# Patient Record
Sex: Female | Born: 1935
Health system: Southern US, Community
[De-identification: ages and names within clinical notes are randomized; demographics above are authoritative.]

## PROBLEM LIST (undated history)

## (undated) DIAGNOSIS — E785 Hyperlipidemia, unspecified: Secondary | ICD-10-CM

## (undated) DIAGNOSIS — I10 Essential (primary) hypertension: Secondary | ICD-10-CM

## (undated) DIAGNOSIS — M1990 Unspecified osteoarthritis, unspecified site: Secondary | ICD-10-CM

## (undated) DIAGNOSIS — K219 Gastro-esophageal reflux disease without esophagitis: Secondary | ICD-10-CM

## (undated) DIAGNOSIS — I2089 Other forms of angina pectoris: Secondary | ICD-10-CM

## (undated) DIAGNOSIS — R1310 Dysphagia, unspecified: Secondary | ICD-10-CM

## (undated) DIAGNOSIS — I208 Other forms of angina pectoris: Secondary | ICD-10-CM

## (undated) DIAGNOSIS — K635 Polyp of colon: Secondary | ICD-10-CM

## (undated) DIAGNOSIS — D509 Iron deficiency anemia, unspecified: Secondary | ICD-10-CM

## (undated) DIAGNOSIS — E119 Type 2 diabetes mellitus without complications: Secondary | ICD-10-CM

## (undated) HISTORY — DX: Hyperlipidemia, unspecified: E78.5

## (undated) HISTORY — PX: HERNIA REPAIR: SHX51

## (undated) HISTORY — DX: Iron deficiency anemia, unspecified: D50.9

## (undated) HISTORY — PX: ABDOMINAL HYSTERECTOMY: SHX81

---
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
## 1968-11-18 HISTORY — PX: ABDOMINAL HYSTERECTOMY: SHX81

## 1968-11-18 HISTORY — PX: APPENDECTOMY: SHX54

## 2005-09-19 ENCOUNTER — Emergency Department: Payer: Self-pay | Admitting: Emergency Medicine

## 2005-09-19 ENCOUNTER — Other Ambulatory Visit: Payer: Self-pay

## 2007-03-16 ENCOUNTER — Ambulatory Visit: Payer: Self-pay | Admitting: Unknown Physician Specialty

## 2007-03-16 ENCOUNTER — Other Ambulatory Visit: Payer: Self-pay

## 2007-05-25 ENCOUNTER — Ambulatory Visit: Payer: Self-pay | Admitting: Unknown Physician Specialty

## 2009-10-12 ENCOUNTER — Emergency Department: Payer: Self-pay | Admitting: Emergency Medicine

## 2009-10-23 ENCOUNTER — Emergency Department: Payer: Self-pay | Admitting: Unknown Physician Specialty

## 2012-01-07 ENCOUNTER — Ambulatory Visit: Payer: Self-pay

## 2012-02-06 ENCOUNTER — Ambulatory Visit: Payer: Self-pay | Admitting: Unknown Physician Specialty

## 2012-08-28 ENCOUNTER — Emergency Department: Payer: Self-pay | Admitting: Emergency Medicine

## 2014-11-23 DIAGNOSIS — Z961 Presence of intraocular lens: Secondary | ICD-10-CM | POA: Diagnosis not present

## 2014-11-23 DIAGNOSIS — E11311 Type 2 diabetes mellitus with unspecified diabetic retinopathy with macular edema: Secondary | ICD-10-CM | POA: Diagnosis not present

## 2014-11-24 DIAGNOSIS — R05 Cough: Secondary | ICD-10-CM | POA: Diagnosis not present

## 2014-11-24 DIAGNOSIS — E119 Type 2 diabetes mellitus without complications: Secondary | ICD-10-CM | POA: Diagnosis not present

## 2014-11-24 DIAGNOSIS — T148 Other injury of unspecified body region: Secondary | ICD-10-CM | POA: Diagnosis not present

## 2014-11-24 DIAGNOSIS — L03115 Cellulitis of right lower limb: Secondary | ICD-10-CM | POA: Diagnosis not present

## 2014-11-24 DIAGNOSIS — I10 Essential (primary) hypertension: Secondary | ICD-10-CM | POA: Diagnosis not present

## 2014-12-08 DIAGNOSIS — E785 Hyperlipidemia, unspecified: Secondary | ICD-10-CM | POA: Diagnosis not present

## 2014-12-08 DIAGNOSIS — R05 Cough: Secondary | ICD-10-CM | POA: Diagnosis not present

## 2014-12-08 DIAGNOSIS — Z1389 Encounter for screening for other disorder: Secondary | ICD-10-CM | POA: Diagnosis not present

## 2014-12-08 DIAGNOSIS — Z9181 History of falling: Secondary | ICD-10-CM | POA: Diagnosis not present

## 2014-12-08 DIAGNOSIS — I10 Essential (primary) hypertension: Secondary | ICD-10-CM | POA: Diagnosis not present

## 2014-12-21 DIAGNOSIS — J387 Other diseases of larynx: Secondary | ICD-10-CM | POA: Diagnosis not present

## 2014-12-21 DIAGNOSIS — R490 Dysphonia: Secondary | ICD-10-CM | POA: Diagnosis not present

## 2014-12-21 DIAGNOSIS — E041 Nontoxic single thyroid nodule: Secondary | ICD-10-CM | POA: Diagnosis not present

## 2014-12-26 ENCOUNTER — Ambulatory Visit: Payer: Self-pay | Admitting: Otolaryngology

## 2014-12-26 DIAGNOSIS — E041 Nontoxic single thyroid nodule: Secondary | ICD-10-CM | POA: Diagnosis not present

## 2015-01-10 ENCOUNTER — Ambulatory Visit: Payer: Self-pay | Admitting: Otolaryngology

## 2015-01-10 DIAGNOSIS — J387 Other diseases of larynx: Secondary | ICD-10-CM | POA: Diagnosis not present

## 2015-01-10 DIAGNOSIS — R490 Dysphonia: Secondary | ICD-10-CM | POA: Diagnosis not present

## 2015-01-10 DIAGNOSIS — E041 Nontoxic single thyroid nodule: Secondary | ICD-10-CM | POA: Diagnosis not present

## 2015-02-01 DIAGNOSIS — E041 Nontoxic single thyroid nodule: Secondary | ICD-10-CM | POA: Diagnosis not present

## 2015-02-01 DIAGNOSIS — J387 Other diseases of larynx: Secondary | ICD-10-CM | POA: Diagnosis not present

## 2015-02-01 DIAGNOSIS — J301 Allergic rhinitis due to pollen: Secondary | ICD-10-CM | POA: Diagnosis not present

## 2015-02-01 DIAGNOSIS — R1310 Dysphagia, unspecified: Secondary | ICD-10-CM | POA: Diagnosis not present

## 2015-03-15 ENCOUNTER — Ambulatory Visit
Admit: 2015-03-15 | Disposition: A | Discharge status: Home / Self Care | Payer: Self-pay | Attending: Family Medicine | Admitting: Family Medicine

## 2015-03-15 DIAGNOSIS — M1990 Unspecified osteoarthritis, unspecified site: Secondary | ICD-10-CM | POA: Diagnosis not present

## 2015-03-15 DIAGNOSIS — R0789 Other chest pain: Secondary | ICD-10-CM | POA: Diagnosis not present

## 2015-03-15 DIAGNOSIS — E119 Type 2 diabetes mellitus without complications: Secondary | ICD-10-CM | POA: Diagnosis not present

## 2015-03-15 DIAGNOSIS — K219 Gastro-esophageal reflux disease without esophagitis: Secondary | ICD-10-CM | POA: Diagnosis not present

## 2015-03-15 DIAGNOSIS — I10 Essential (primary) hypertension: Secondary | ICD-10-CM | POA: Diagnosis not present

## 2015-03-16 ENCOUNTER — Ambulatory Visit: Admit: 2015-03-16 | Disposition: A | Discharge status: Home / Self Care | Payer: Self-pay | Admitting: Family Medicine

## 2015-03-16 DIAGNOSIS — R05 Cough: Secondary | ICD-10-CM | POA: Diagnosis not present

## 2015-03-16 DIAGNOSIS — R0789 Other chest pain: Secondary | ICD-10-CM | POA: Diagnosis not present

## 2015-04-22 ENCOUNTER — Encounter: Payer: Self-pay | Admitting: Emergency Medicine

## 2015-04-22 ENCOUNTER — Emergency Department
Admission: EM | Admit: 2015-04-22 | Discharge: 2015-04-22 | Disposition: A | Discharge status: Home / Self Care | Payer: Medicare Other | Attending: Emergency Medicine | Admitting: Emergency Medicine

## 2015-04-22 DIAGNOSIS — L237 Allergic contact dermatitis due to plants, except food: Secondary | ICD-10-CM | POA: Diagnosis not present

## 2015-04-22 DIAGNOSIS — E119 Type 2 diabetes mellitus without complications: Secondary | ICD-10-CM | POA: Diagnosis not present

## 2015-04-22 DIAGNOSIS — I10 Essential (primary) hypertension: Secondary | ICD-10-CM | POA: Insufficient documentation

## 2015-04-22 HISTORY — DX: Type 2 diabetes mellitus without complications: E11.9

## 2015-04-22 HISTORY — DX: Essential (primary) hypertension: I10

## 2015-04-22 MED ORDER — PREDNISONE 10 MG PO TABS: 50.0000 mg | ORAL_TABLET | Freq: Every day | ORAL | Status: AC

## 2015-04-22 MED ORDER — DEXAMETHASONE SODIUM PHOSPHATE 4 MG/ML IJ SOLN: 8.0000 mg | Freq: Once | INTRAMUSCULAR | Status: AC

## 2015-04-22 MED ORDER — DEXAMETHASONE SODIUM PHOSPHATE 10 MG/ML IJ SOLN
INTRAMUSCULAR | Status: AC
  Administered 2015-04-22: 8 mg via INTRAMUSCULAR
  Filled 2015-04-22: qty 1

## 2015-04-22 NOTE — ED Notes (Signed)
Patient states she was in her yard about a week ago cleaning under her grapevine.  2-3 days ago started getting a rash around right eye.  Swelling has increased since yesterday and patient states she hasn't been feeling well either.

## 2015-04-22 NOTE — ED Provider Notes (Signed)
[facility] Emergency Department Provider Note  ____________________________________________  Time seen: Approximately 11:16 AM  I have reviewed the triage vital signs and the nursing notes.   HISTORY  Chief Complaint Poison Ivy    HPI Sherri Leon is a 79 y.o. female Lib[place] Maw for evaluation of poison oak/ivy around her right eye 2 days. She states she was cleaning out in the yard rub dry and her eyes progressively gotten more and more swollen.   Past Medical History  Diagnosis Date  . Diabetes mellitus without complication   . Hypertension     There are no active problems to display for this patient.   Past Surgical History  Procedure Laterality Date  . Abdominal hysterectomy    . Hernia repair      Current Outpatient Rx  Name  Route  Sig  Dispense  Refill  . predniSONE (DELTASONE) 10 MG tablet   Oral   Take 5 tablets (50 mg total) by mouth daily with breakfast.   25 tablet   0     Allergies Review of patient's allergies indicates no known allergies.  Family History  Problem Relation Age of Onset  . Family history unknown: Yes    Social History History  Substance Use Topics  . Smoking status: Never Smoker   . Smokeless tobacco: Never Used  . Alcohol Use: No    Review of Systems Constitutional: No fever/chills Eyes: No visual changes. Rash and swelling around the right eye. ENT: No sore throat. Cardiovascular: Denies chest pain. Respiratory: Denies shortness of breath. Gastrointestinal: No abdominal pain.  No nausea, no vomiting.  No diarrhea.  No constipation. Genitourinary: Negative for dysuria. Musculoskeletal: Negative for back pain. Skin: Negative for rash. Neurological: Negative for headaches, focal weakness or numbness.  10-point ROS otherwise negative.  ____________________________________________   PHYSICAL EXAM:  VITAL SIGNS: ED Triage Vitals  Enc Vitals Group     BP 04/22/15 1112 109/58 mmHg   Pulse Rate 04/22/15 1112 56     Resp 04/22/15 1112 18     Temp 04/22/15 1112 98.2 F (36.8 C)     Temp Source 04/22/15 1112 Oral     SpO2 04/22/15 1112 97 %     Weight 04/22/15 1112 175 lb (79.379 kg)     Height 04/22/15 1112 5\' 7"  (1.702 m)     Head Cir --      Peak Flow --      Pain Score 04/22/15 1107 0     Pain Loc --      Pain Edu? --      Excl. in [place]? --     Constitutional: Alert and oriented. Well appearing and in no acute distress. Eyes: Conjunctivae are normal. PERRL. EOMI. positive periorbital edema with vesicular type lesions Head: Atraumatic. Nose: No congestion/rhinnorhea. Mouth/Throat: Mucous membranes are moist.  Oropharynx non-erythematous. Neck: No stridor.   Cardiovascular: Normal rate, regular rhythm. Grossly normal heart sounds.  Good peripheral circulation. Respiratory: Normal respiratory effort.  No retractions. Lungs CTAB. Neurologic:  Normal speech and language. No gross focal neurologic deficits are appreciated. Speech is normal. No gait instability. Skin:  Skin is warm, dry and intact. No rash noted. Psychiatric: Mood and affect are normal. Speech and behavior are normal.  ____________________________________________   LABS (all labs ordered are listed, but only abnormal results are displayed)  Labs Reviewed - No data to display ____________________________________________  EKG  Not applicable ____________________________________________  RADIOLOGY  Deferred ____________________________________________   PROCEDURES  Procedure(s) performed:  None  Critical Care performed: No  ____________________________________________   INITIAL IMPRESSION / ASSESSMENT AND PLAN / ED COURSE  Pertinent labs & imaging results that were available during my care of the patient were reviewed by me and considered in my medical decision making (see chart for details).  Nose with acute poison oak/poison ivy. Treatment includes Decadron IM today follow-up with  prescription of prednisone. Patient to return to clinic as needed. She voices no other emergency medical complaints at this time. ____________________________________________   FINAL CLINICAL IMPRESSION(S) / ED DIAGNOSES  Final diagnoses:  Poison oak dermatitis      Arlyss Repress, PA-C 04/22/15 1130  Lisa Roca, MD 04/22/15 1540

## 2015-04-22 NOTE — Discharge Instructions (Signed)
Contact Dermatitis Contact dermatitis is a rash that happens when something touches the skin. You touched something that irritates your skin, or you have allergies to something you touched. HOME CARE   Avoid the thing that caused your rash.  Keep your rash away from hot water, soap, sunlight, chemicals, and other things that might bother it.  Do not scratch your rash.  You can take cool baths to help stop itching.  Only take medicine as told by your doctor.  Keep all doctor visits as told. GET HELP RIGHT AWAY IF:   Your rash is not better after 3 days.  Your rash gets worse.  Your rash is puffy (swollen), tender, red, sore, or warm.  You have problems with your medicine. MAKE SURE YOU:   Understand these instructions.  Will watch your condition.  Will get help right away if you are not doing well or get worse. Document Released: 09/01/2009 Document Revised: 01/27/2012 Document Reviewed: 04/09/2011 [facility] Patient Information 2015 [place], [place]. This information is not intended to replace advice given to you by your health care provider. Make sure you discuss any questions you have with your health care provider.  Poison [facility] is an inflammation of the skin (contact dermatitis). It is caused by contact with the allergens on the leaves of the oak (toxicodendron) plants. Depending on your sensitivity, the rash may consist simply of redness and itching, or it may also progress to blisters which may break open (rupture). These must be well cared for to prevent secondary germ (bacterial) infection as these infections can lead to scarring. The eyes may also get puffy. The puffiness is worst in the morning and gets better as the day progresses. Healing is best accomplished by keeping any open areas dry, clean, covered with a bandage, and covered with an antibacterial ointment if needed. Without secondary infection, this dermatitis usually heals without scarring within 2 to 3  weeks without treatment. HOME CARE INSTRUCTIONS When you have been exposed to poison oak, it is very important to thoroughly wash with soap and water as soon as the exposure has been discovered. You have about one half hour to remove the plant resin before it will cause the rash. This cleaning will quickly destroy the oil or antigen on the skin (the antigen is what causes the rash). Wash aggressively under the fingernails as any plant resin still there will continue to spread the rash. Do not rub skin vigorously when washing affected area. Poison oak cannot spread if no oil from the plant remains on your body. Rash that has progressed to weeping sores (lesions) will not spread the rash unless you have not washed thoroughly. It is also important to clean any clothes you have been wearing as they may carry active allergens which will spread the rash, even several days later. Avoidance of the plant in the future is the best measure. Poison oak plants can be recognized by the number of leaves. Generally, poison oak has three leaves with flowering branches on a single stem. Diphenhydramine may be purchased over the counter and used as needed for itching. Do not drive with this medication if it makes you drowsy. Ask your caregiver about medication for children. SEEK IMMEDIATE MEDICAL CARE IF:   Open areas of the rash develop.  You notice redness extending beyond the area of the rash.  There is a pus like discharge.  There is increased pain.  Other signs of infection develop (such as fever). Document Released: 05/11/2003 Document  Revised: 01/27/2012 Document Reviewed: 09/20/2009 [employer] Patient Information 2015 [place], [place]. This information is not intended to replace advice given to you by your health care provider. Make sure you discuss any questions you have with your health care provider.

## 2015-05-20 IMAGING — US ULTRASOUND CORE BIOPSY
1 series · 9 of 9 positions shown · non-contrast
Comparison: none

CLINICAL DATA: Solitary  left thyroid nodule.

EXAM:
ULTRASOUND-GUIDED THYROID ASPIRATION BIOPSY
TECHNIQUE: The procedure, risks (including but not limited to bleeding,
infection, organ damage ), benefits, and alternatives were explained
to the patient. Questions regarding the procedure were encouraged
and answered. The patient understands and consents to the procedure.

[Series 1: ultrasound core biopsy · 0.06mm/px · 9 acquisitions, 9 frames shown]
[im 1/9]
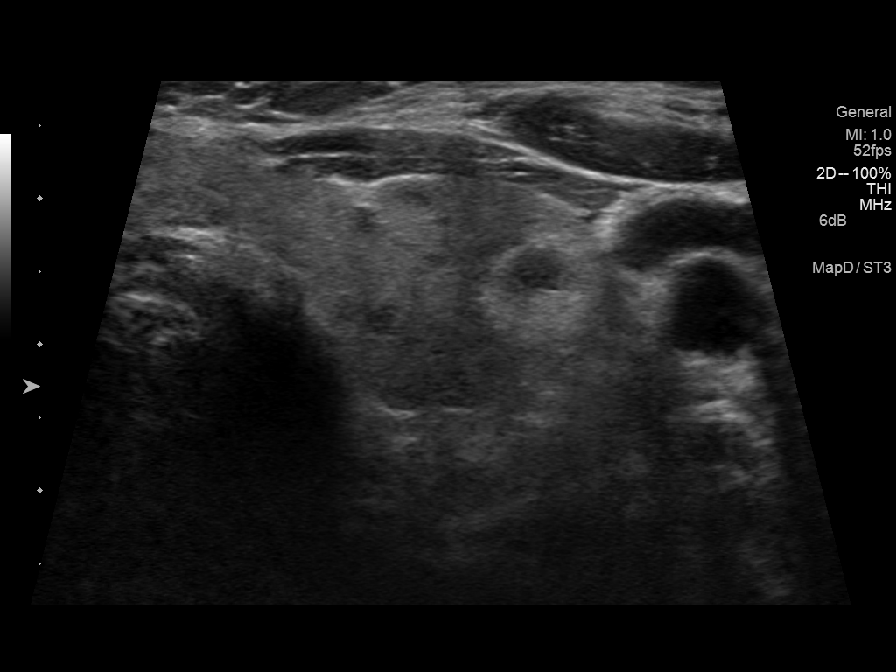
[im 2/9]
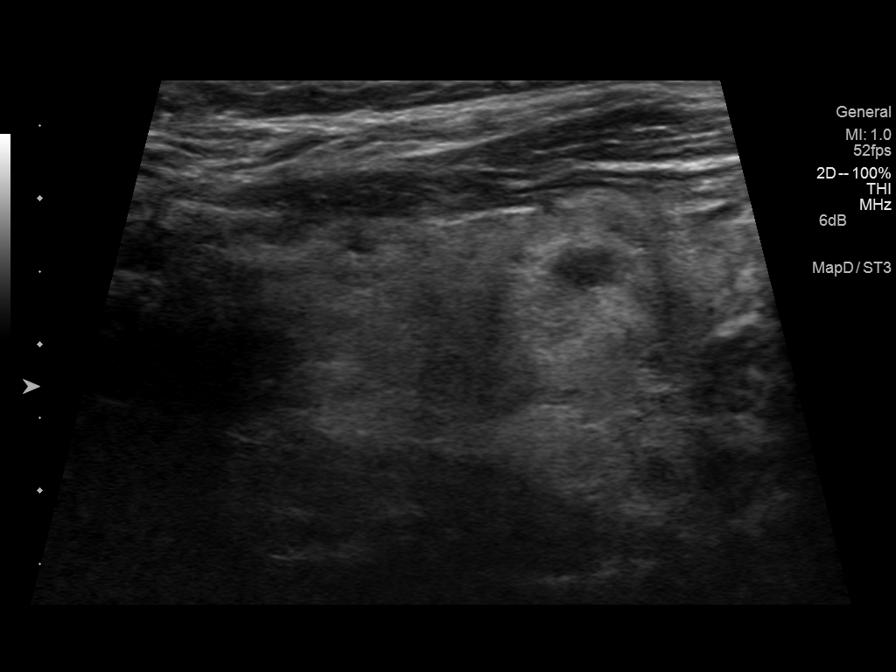
[im 3/9]
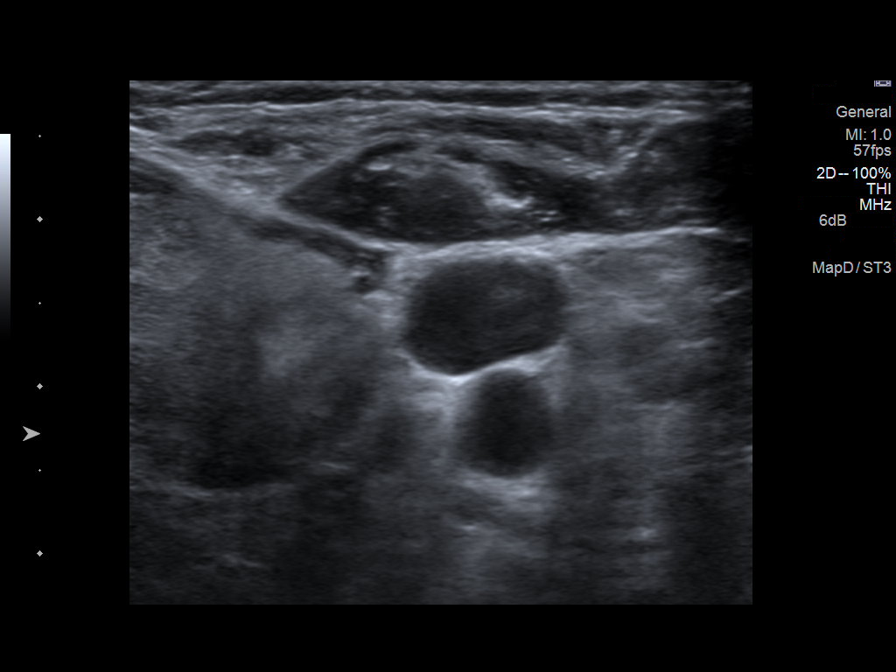
[im 4/9]
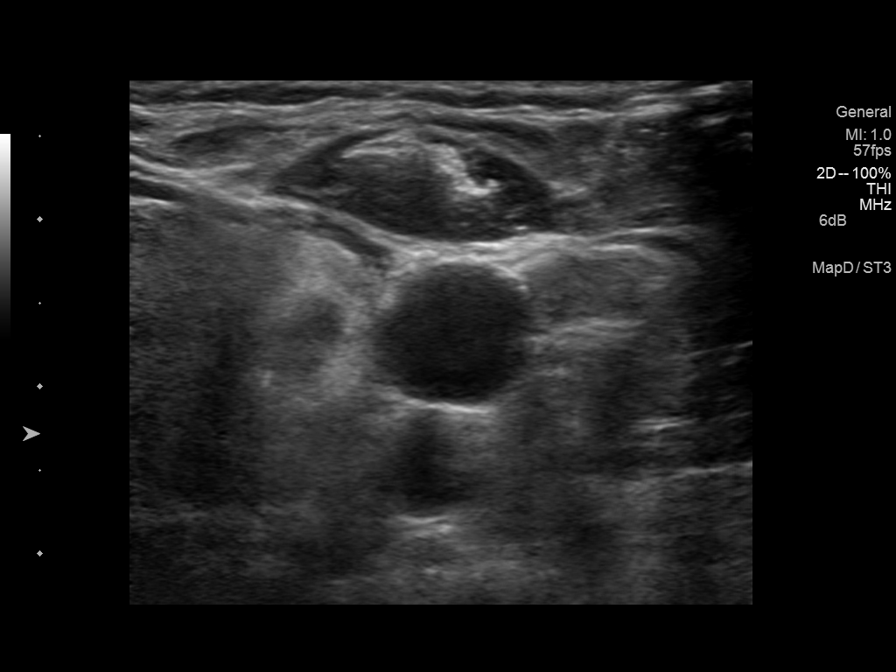
[im 5/9]
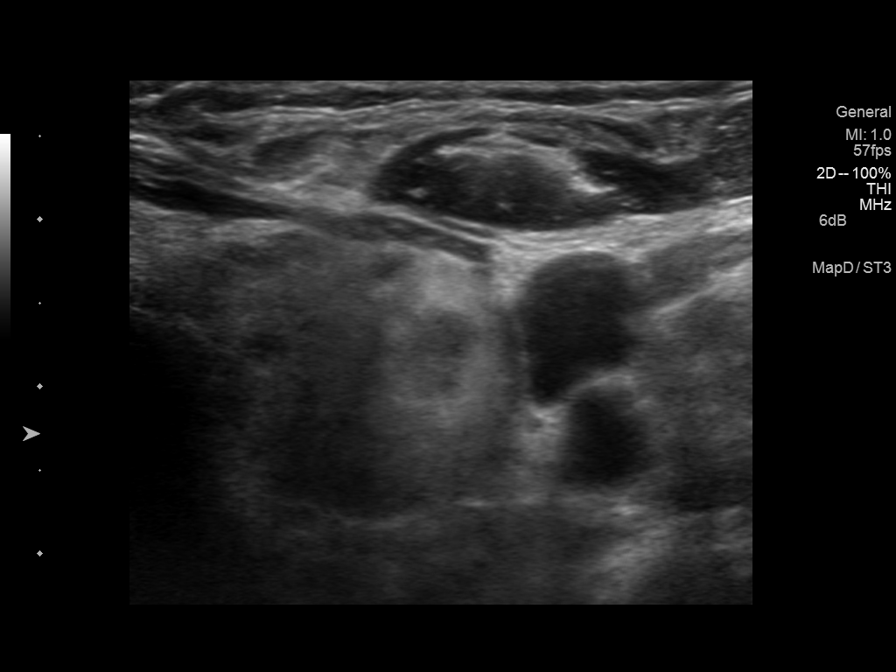
[im 6/9]
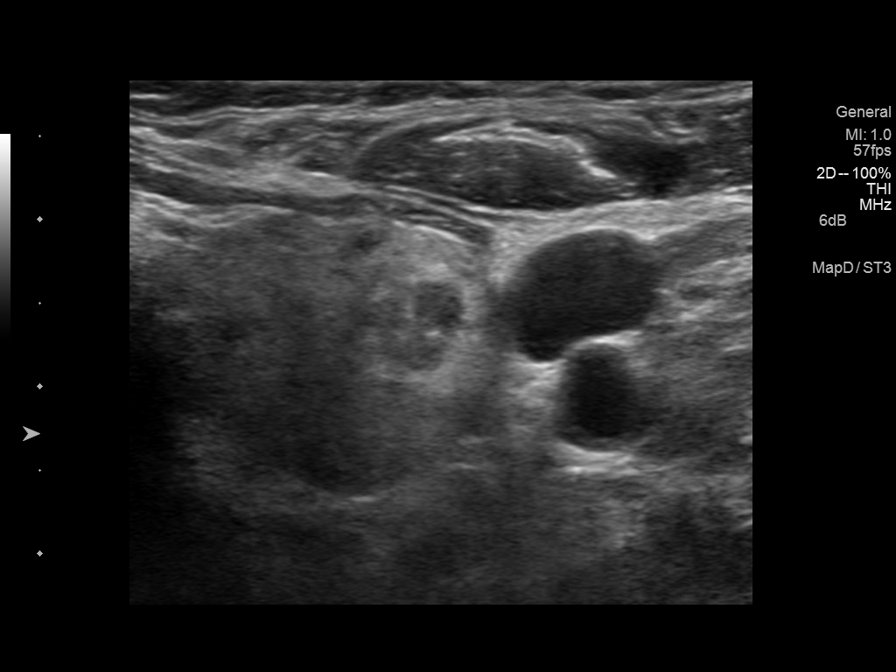
[im 7/9]
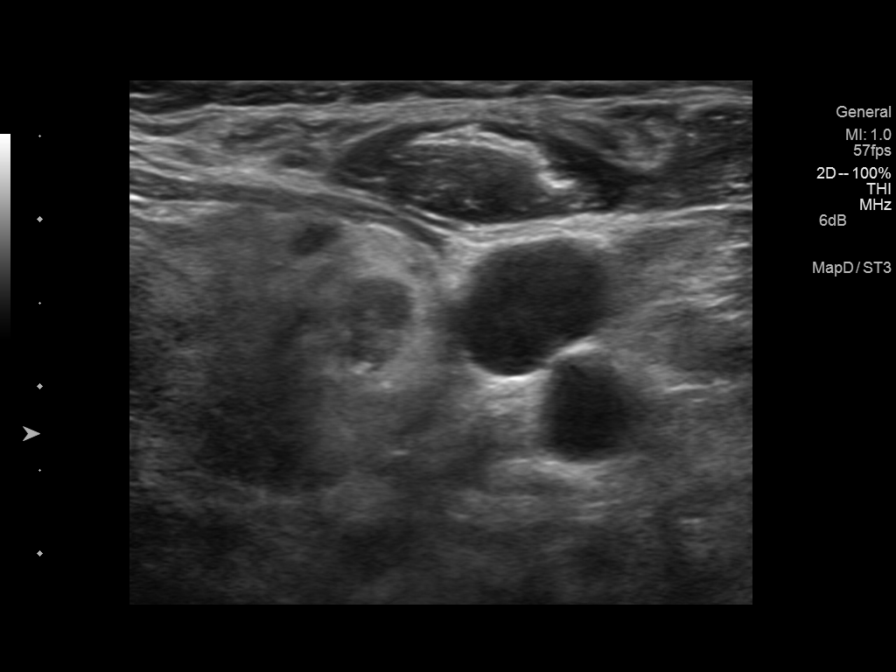
[im 8/9]
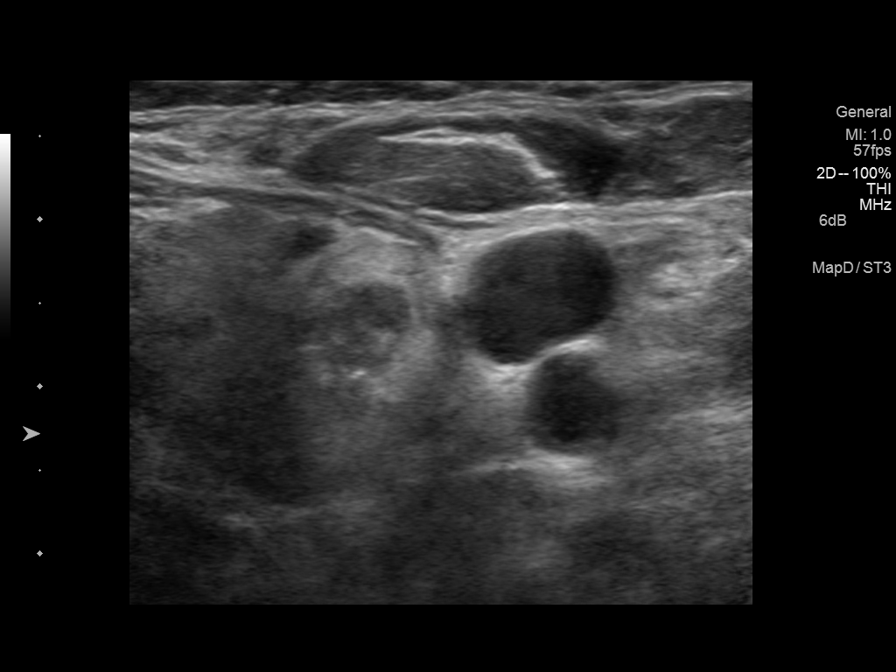
[im 9/9]
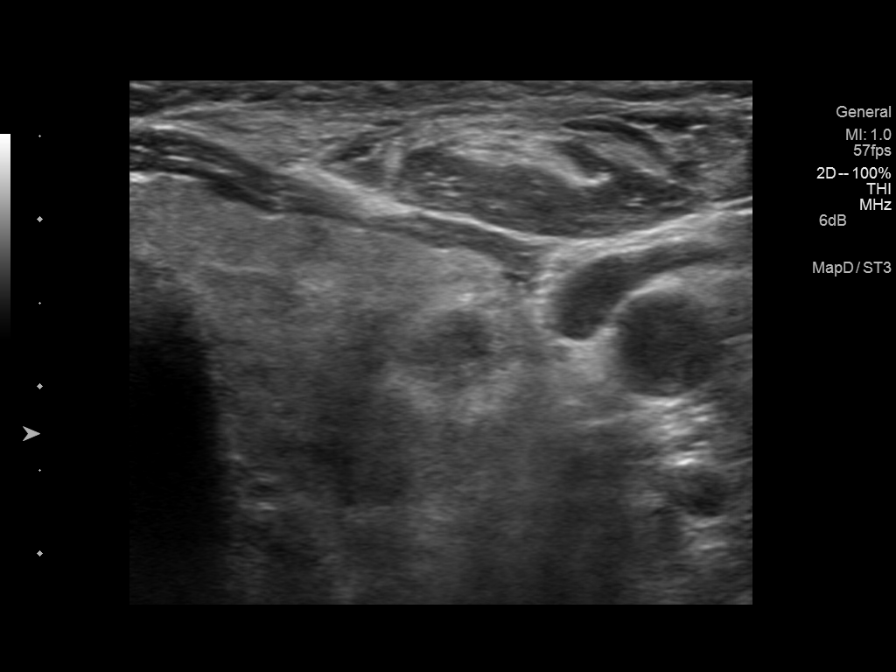

[9 of 9 positions shown; findings below may reference images not displayed]

Survey ultrasound was performed and the dominant lesion in the mid
left lobe was localized. An appropriate skin entry site was
determined. Skin was marked, then prepped with Betadine, draped in
usual sterile fashion, and infiltrated locally with 1% lidocaine.
Under real-time ultrasound guidance, 4 passes were made into the
lesion with 25 gauge needles. Quick stain analysis was adequate per
[employer]. The patient tolerated procedure well.

COMPLICATIONS:
COMPLICATIONS
none
IMPRESSION: 1. Technically successful ultrasound-guided thyroid aspiration
biopsy , solitary left nodule.

## 2015-06-19 ENCOUNTER — Encounter: Payer: Self-pay | Admitting: Family Medicine

## 2015-06-19 ENCOUNTER — Ambulatory Visit (INDEPENDENT_AMBULATORY_CARE_PROVIDER_SITE_OTHER): Payer: Medicare Other | Admitting: Family Medicine

## 2015-06-19 VITALS — BP 107/69 | HR 66 | Resp 16 | Ht 67.75 in | Wt 175.4 lb

## 2015-06-19 DIAGNOSIS — E119 Type 2 diabetes mellitus without complications: Secondary | ICD-10-CM | POA: Diagnosis not present

## 2015-06-19 DIAGNOSIS — K219 Gastro-esophageal reflux disease without esophagitis: Secondary | ICD-10-CM | POA: Diagnosis not present

## 2015-06-19 DIAGNOSIS — I10 Essential (primary) hypertension: Secondary | ICD-10-CM | POA: Diagnosis not present

## 2015-06-19 DIAGNOSIS — E785 Hyperlipidemia, unspecified: Secondary | ICD-10-CM | POA: Diagnosis not present

## 2015-06-19 LAB — POCT GLYCOSYLATED HEMOGLOBIN (HGB A1C): Hemoglobin A1C: 6.6

## 2015-06-19 MED ORDER — OMEPRAZOLE 20 MG PO CPDR: 20.0000 mg | DELAYED_RELEASE_CAPSULE | Freq: Two times a day (BID) | ORAL | Status: DC

## 2015-06-19 NOTE — Progress Notes (Signed)
Name: Sherri Leon   MRN: [id number]    DOB: 05/15/1936   Date:06/19/2015       Progress Note  Subjective  Chief Complaint  Chief Complaint  Patient presents with  . Diabetes    80-100     . Gastrophageal Reflux    Likes Omeprazole better - trouble swallowing     HPI  Here for f/u of DM and GERD.  C/o lots of extra indigestion with change to Protonix.  Wants to go back to Omeprazole. BSs at home run 80-100.  Sshe reduced dose of Metformin to 500 mg., 1/2 daily b/o low blood sugars at home 3 weeks ago.     Past Medical History  Diagnosis Date  . Diabetes mellitus without complication   . Hyperlipidemia   . Hypertension     Past Surgical History  Procedure Laterality Date  . Hernia repair    . Appendectomy  1970  . Abdominal hysterectomy  1970  . Tonsillectomy  1955    Family History  Problem Relation Age of Onset  . Family history unknown: Yes    History   Social History  . Marital Status: Married    Spouse Name: N/A  . Number of Children: N/A  . Years of Education: N/A   Occupational History  . Not on file.   Social History Main Topics  . Smoking status: Never Smoker   . Smokeless tobacco: Never Used  . Alcohol Use: No  . Drug Use: No  . Sexual Activity: Not on file   Other Topics Concern  . Not on file   Social History Narrative  . No narrative on file     Current outpatient prescriptions:  Marland Kitchen  GARLIC PO, Take by mouth., Disp: , Rfl:  .  hydrochlorothiazide (HYDRODIURIL) 12.5 MG tablet, , Disp: , Rfl:  .  losartan (COZAAR) 50 MG tablet, , Disp: , Rfl:  .  metFORMIN (GLUCOPHAGE) 500 MG tablet, Take 500 mg by mouth. Taking 1/2, Disp: , Rfl:  .  Multiple Vitamins-Minerals (MULTIVITAMIN ADULT PO), Take by mouth., Disp: , Rfl:  .  pantoprazole (PROTONIX) 40 MG tablet, , Disp: , Rfl:  .  pravastatin (PRAVACHOL) 40 MG tablet, , Disp: , Rfl:   No Known Allergies   Review of Systems  Constitutional: Negative for fever, chills, weight loss and  malaise/fatigue.  Eyes: Negative for blurred vision and double vision.  Respiratory: Negative for cough, sputum production, shortness of breath and wheezing.   Cardiovascular: Negative for chest pain, palpitations, orthopnea, claudication and leg swelling.  Gastrointestinal: Positive for heartburn and abdominal pain. Negative for nausea, vomiting, diarrhea and blood in stool.  Genitourinary: Negative for dysuria, urgency and frequency.  Musculoskeletal: Negative for myalgias and joint pain.  Skin: Negative for rash.  Neurological: Negative for dizziness, weakness and headaches.  Psychiatric/Behavioral: Negative for depression. The patient is not nervous/anxious.       Objective  Filed Vitals:   06/19/15 1401  BP: 107/69  Pulse: 66  Resp: 16  Height: 5' 7.75" (1.721 m)  Weight: 175 lb 6.4 oz (79.561 kg)    Physical Exam  Constitutional: She is oriented to person, place, and time and well-developed, well-nourished, and in no distress.  HENT:  Head: Normocephalic and atraumatic.  Eyes: Conjunctivae and EOM are normal. Pupils are equal, round, and reactive to light. No scleral icterus.  Neck: Normal range of motion. Neck supple. No thyromegaly present.  Cardiovascular: Normal rate, regular rhythm, normal heart sounds and intact  distal pulses.  Exam reveals no gallop and no friction rub.   No murmur heard. Pulmonary/Chest: Effort normal and breath sounds normal. No respiratory distress. She has no wheezes. She has no rales.  Abdominal: Soft. Bowel sounds are normal. She exhibits no distension and no mass. There is no tenderness.  Musculoskeletal: She exhibits no edema.  Lymphadenopathy:    She has no cervical adenopathy.  Neurological: She is alert and oriented to person, place, and time.  Skin: Skin is warm and dry.  Vitals reviewed.      Recent Results (from the past 2160 hour(s))  POCT HgB A1C     Status: Abnormal   Collection Time: 06/19/15  2:28 PM  Result Value Ref  Range   Hemoglobin A1C 6.6      Assessment & Plan  Problem List Items Addressed This Visit      Cardiovascular and Mediastinum   Hypertension   Relevant Medications   hydrochlorothiazide (HYDRODIURIL) 12.5 MG tablet   losartan (COZAAR) 50 MG tablet   pravastatin (PRAVACHOL) 40 MG tablet     Digestive   GERD without esophagitis   Relevant Medications   pantoprazole (PROTONIX) 40 MG tablet     Endocrine   Diabetes - Primary   Relevant Medications   losartan (COZAAR) 50 MG tablet   metFORMIN (GLUCOPHAGE) 500 MG tablet   pravastatin (PRAVACHOL) 40 MG tablet   Other Relevant Orders   POCT HgB A1C (Completed)     Other   Hyperlipidemia   Relevant Medications   hydrochlorothiazide (HYDRODIURIL) 12.5 MG tablet   losartan (COZAAR) 50 MG tablet   pravastatin (PRAVACHOL) 40 MG tablet      Meds ordered this encounter  Medications  . hydrochlorothiazide (HYDRODIURIL) 12.5 MG tablet    Sig:   . losartan (COZAAR) 50 MG tablet    Sig:   . metFORMIN (GLUCOPHAGE) 500 MG tablet    Sig: Take 500 mg by mouth. Taking 1/2  . pantoprazole (PROTONIX) 40 MG tablet    Sig:   . pravastatin (PRAVACHOL) 40 MG tablet    Sig:   . DISCONTD: predniSONE (DELTASONE) 10 MG tablet    Sig:   . GARLIC PO    Sig: Take by mouth.  . Multiple Vitamins-Minerals (MULTIVITAMIN ADULT PO)    Sig: Take by mouth.    1. Hyperlipidemia   2. Essential hypertension   -discontinue HCTZ  3. Type 2 diabetes mellitus without complication  - discontinue Metformin  - POCT HgB A1C - 6.6  4. GERD without esophagitis  - omeprazole (PRILOSEC) 20 MG capsule; Take 1 capsule (20 mg total) by mouth 2 (two) times daily before a meal.  Dispense: 60 capsule; Refill: 12

## 2015-06-20 ENCOUNTER — Telehealth: Payer: Self-pay

## 2015-06-20 ENCOUNTER — Other Ambulatory Visit: Payer: Self-pay | Admitting: Family Medicine

## 2015-06-20 MED ORDER — LOSARTAN POTASSIUM 50 MG PO TABS: 50.0000 mg | ORAL_TABLET | Freq: Every day | ORAL | Status: DC

## 2015-06-20 MED ORDER — PRAVASTATIN SODIUM 40 MG PO TABS: 40.0000 mg | ORAL_TABLET | Freq: Every day | ORAL | Status: DC

## 2015-06-20 NOTE — Telephone Encounter (Signed)
I have refilled Pravastatin and Losartan for 90 days.  She does not need the Metformin.  Tbis was discontinued yesterday.-jh

## 2015-06-20 NOTE — Telephone Encounter (Signed)
Needs refills for Metformin 90 days per medicare  Needs refill for Losartan 90 days per medicare Needs refill for Pravastatin 90 days per medicare.

## 2015-07-10 ENCOUNTER — Encounter: Payer: Self-pay | Admitting: Family Medicine

## 2015-07-10 ENCOUNTER — Ambulatory Visit (INDEPENDENT_AMBULATORY_CARE_PROVIDER_SITE_OTHER): Payer: Medicare Other | Admitting: Family Medicine

## 2015-07-10 VITALS — BP 112/71 | HR 60 | Temp 98.1°F | Resp 16 | Ht 67.0 in | Wt 176.4 lb

## 2015-07-10 DIAGNOSIS — K219 Gastro-esophageal reflux disease without esophagitis: Secondary | ICD-10-CM | POA: Diagnosis not present

## 2015-07-10 DIAGNOSIS — R0789 Other chest pain: Secondary | ICD-10-CM | POA: Diagnosis not present

## 2015-07-10 DIAGNOSIS — R072 Precordial pain: Secondary | ICD-10-CM | POA: Diagnosis not present

## 2015-07-10 MED ORDER — SIMETHICONE 125 MG PO CHEW: 125.0000 mg | CHEWABLE_TABLET | Freq: Four times a day (QID) | ORAL | Status: DC | PRN

## 2015-07-10 MED ORDER — RANITIDINE HCL 150 MG PO CAPS: 150.0000 mg | ORAL_CAPSULE | Freq: Every evening | ORAL | Status: DC

## 2015-07-10 NOTE — Patient Instructions (Addendum)
Please seek immediate medical attention at ER or Urgent Care if you develop: Chest pain, pressure or tightness. Shortness of breath accompanied by nausea or diaphoresis Visual changes Numbness or tingling on one side of the body Facial droop Altered mental status Or any concerning symptoms.   Gastroesophageal Reflux Disease, Adult Gastroesophageal reflux disease (GERD) happens when acid from your stomach flows up into the esophagus. When acid comes in contact with the esophagus, the acid causes soreness (inflammation) in the esophagus. Over time, GERD may create small holes (ulcers) in the lining of the esophagus. CAUSES   Increased body weight. This puts pressure on the stomach, making acid rise from the stomach into the esophagus.  Smoking. This increases acid production in the stomach.  Drinking alcohol. This causes decreased pressure in the lower esophageal sphincter (valve or ring of muscle between the esophagus and stomach), allowing acid from the stomach into the esophagus.  Late evening meals and a full stomach. This increases pressure and acid production in the stomach.  A malformed lower esophageal sphincter. Sometimes, no cause is found. SYMPTOMS   Burning pain in the lower part of the mid-chest behind the breastbone and in the mid-stomach area. This may occur twice a week or more often.  Trouble swallowing.  Sore throat.  Dry cough.  Asthma-like symptoms including chest tightness, shortness of breath, or wheezing. DIAGNOSIS  Your caregiver may be able to diagnose GERD based on your symptoms. In some cases, X-rays and other tests may be done to check for complications or to check the condition of your stomach and esophagus. TREATMENT  Your caregiver may recommend over-the-counter or prescription medicines to help decrease acid production. Ask your caregiver before starting or adding any new medicines.  HOME CARE INSTRUCTIONS   Change the factors that you can  control. Ask your caregiver for guidance concerning weight loss, quitting smoking, and alcohol consumption.  Avoid foods and drinks that make your symptoms worse, such as:  Caffeine or alcoholic drinks.  Chocolate.  Peppermint or mint flavorings.  Garlic and onions.  Spicy foods.  Citrus fruits, such as oranges, lemons, or limes.  Tomato-based foods such as sauce, chili, salsa, and pizza.  Fried and fatty foods.  Avoid lying down for the 3 hours prior to your bedtime or prior to taking a nap.  Eat small, frequent meals instead of large meals.  Wear loose-fitting clothing. Do not wear anything tight around your waist that causes pressure on your stomach.  Raise the head of your bed 6 to 8 inches with wood blocks to help you sleep. Extra pillows will not help.  Only take over-the-counter or prescription medicines for pain, discomfort, or fever as directed by your caregiver.  Do not take aspirin, ibuprofen, or other nonsteroidal anti-inflammatory drugs (NSAIDs). SEEK IMMEDIATE MEDICAL CARE IF:   You have pain in your arms, neck, jaw, teeth, or back.  Your pain increases or changes in intensity or duration.  You develop nausea, vomiting, or sweating (diaphoresis).  You develop shortness of breath, or you faint.  Your vomit is green, yellow, black, or looks like coffee grounds or blood.  Your stool is red, bloody, or black. These symptoms could be signs of other problems, such as heart disease, gastric bleeding, or esophageal bleeding. MAKE SURE YOU:   Understand these instructions.  Will watch your condition.  Will get help right away if you are not doing well or get worse. Document Released: 08/14/2005 Document Revised: 01/27/2012 Document Reviewed: 05/24/2011 [employer] Patient Information  2015 [employer]. This information is not intended to replace advice given to you by your health care provider. Make sure you discuss any questions you have with your health  care provider.

## 2015-07-10 NOTE — Assessment & Plan Note (Signed)
Refractory. Trial of ranitidine at bedtime to see if symptoms improve. Refer to GI for possible endoscopy. BMP and CBC ordered.

## 2015-07-10 NOTE — Progress Notes (Signed)
Subjective:    Patient ID: Sherri Leon, female    DOB: [birth year], 79 y.o.   MRN: [id number]  HPI: Sherri Leon is a 79 y.o. female presenting on 07/10/2015 for Gastrophageal Reflux   HPI  Pt present for evaluation of indigestion. She was seen by Dr. Luan Pulling at the beginning of the month. She was changed to omeprazole twice daily at that appt. She is taking tums and rolaids- with no effect. Pt feels she is belching all the time. Symptoms were present at her 8/1 visit but she feels she is getting worse. Pt feels like she can't get her throat clear. Wakes with a sour taste in the morning. SHe is reporting epigastric pain that has been present  For > 1 mos. Denies  No regurgitation or dysphagia. Belching all the time. Pt had a hiatal hernia on her last EGD. She was seen by  Dr. Vira Agar.   Epigastric pain/Chest heaviness- shortness of breath with exertion.  No diaphoresis. Pt reports shortness of breath is new over the past few weeks when doing stairs. Has attributed to her age. The heaviness has developed over the past month.   Past Medical History  Diagnosis Date  . Diabetes mellitus without complication   . Hyperlipidemia   . Hypertension     Current Outpatient Prescriptions on File Prior to Visit  Medication Sig  . GARLIC PO Take by mouth.  . losartan (COZAAR) 50 MG tablet Take 1 tablet (50 mg total) by mouth daily.  . Multiple Vitamins-Minerals (MULTIVITAMIN ADULT PO) Take by mouth.  Marland Kitchen omeprazole (PRILOSEC) 20 MG capsule Take 1 capsule (20 mg total) by mouth 2 (two) times daily before a meal.  . pravastatin (PRAVACHOL) 40 MG tablet Take 1 tablet (40 mg total) by mouth daily.   No current facility-administered medications on file prior to visit.    Review of Systems  Constitutional: Negative for fever and chills.  HENT: Positive for trouble swallowing (lump in throat.).   Respiratory: Positive for shortness of breath. Negative for choking.   Cardiovascular: Positive for chest  pain (epigastric and substernal heaviness.). Negative for palpitations and leg swelling.  Gastrointestinal: Positive for abdominal distention. Negative for nausea, vomiting, blood in stool and rectal pain.  Genitourinary: Negative.   Neurological: Negative for dizziness, weakness, light-headedness and numbness.  Psychiatric/Behavioral: Negative.    Per HPI unless specifically indicated above     Objective:    BP 112/71 mmHg  Pulse 60  Temp(Src) 98.1 F (36.7 C) (Oral)  Resp 16  Ht 5\' 7"  (1.702 m)  Wt 176 lb 6.4 oz (80.015 kg)  BMI 27.62 kg/m2  Wt Readings from Last 3 Encounters:  07/10/15 176 lb 6.4 oz (80.015 kg)  06/19/15 175 lb 6.4 oz (79.561 kg)    Physical Exam  Constitutional: She is oriented to person, place, and time. She appears well-developed and well-nourished. No distress.  Cardiovascular: Normal rate, regular rhythm and intact distal pulses.  PMI is not displaced.  Exam reveals no gallop and no friction rub.   No murmur heard. Pulmonary/Chest: Effort normal and breath sounds normal. She has no wheezes. She exhibits no tenderness.  Abdominal: There is no hepatosplenomegaly. There is no tenderness.  Neurological: She is alert and oriented to person, place, and time. No cranial nerve deficit.  Skin: She is not diaphoretic.   Results for orders placed or performed in visit on 06/19/15  POCT HgB A1C  Result Value Ref Range   Hemoglobin A1C 6.6  Assessment & Plan:   Problem List Items Addressed This Visit      Digestive   GERD without esophagitis    Refractory. Trial of ranitidine at bedtime to see if symptoms improve. Refer to GI for possible endoscopy. BMP and CBC ordered.        Relevant Medications   ranitidine (ZANTAC) 150 MG capsule   simethicone (MYLICON) 863 MG chewable tablet   Other Relevant Orders   Ambulatory referral to Gastroenterology    Other Visit Diagnoses    Substernal chest pain    -  Primary    GERD r/t symptoms vs. cardiac  causes. EKG non specific T wave changes in V2-V5. Cardiology referral pending.     Relevant Orders    EKG 81-RRNH    Basic Metabolic Panel (BMET)    CBC with Differential    Ambulatory referral to Cardiology    Chest heaviness        GERD related vs. cardiac cause. Given pt age, risk factors, and non-specific changes on EKG; she would prefer to be evaluated by cardiology. Alarm symptoms reviewed and pt encouraged to go the the ER.        Meds ordered this encounter  Medications  . ranitidine (ZANTAC) 150 MG capsule    Sig: Take 1 capsule (150 mg total) by mouth every evening.    Dispense:  30 capsule    Refill:  11    Order Specific Question:  Supervising Provider    Answer:  Arlis Porta [phone]  . simethicone (MYLICON) 833 MG chewable tablet    Sig: Chew 1 tablet (125 mg total) by mouth every 6 (six) hours as needed for flatulence.    Dispense:  30 tablet    Refill:  0    Order Specific Question:  Supervising Provider    Answer:  Arlis Porta [[id number]]      Follow up plan: Return if symptoms worsen or fail to improve.

## 2015-07-18 DIAGNOSIS — R1013 Epigastric pain: Secondary | ICD-10-CM | POA: Diagnosis not present

## 2015-07-18 LAB — CBC WITH DIFFERENTIAL/PLATELET
BASOS ABS: 0 10*3/uL (ref 0.0–0.2)
Basos: 0 %
EOS (ABSOLUTE): 0.1 10*3/uL (ref 0.0–0.4)
Eos: 3 %
HEMOGLOBIN: 12.2 g/dL (ref 11.1–15.9)
Hematocrit: 36.5 % (ref 34.0–46.6)
IMMATURE GRANS (ABS): 0 10*3/uL (ref 0.0–0.1)
IMMATURE GRANULOCYTES: 0 %
LYMPHS: 43 %
Lymphocytes Absolute: 2 10*3/uL (ref 0.7–3.1)
MCH: 27.2 pg (ref 26.6–33.0)
MCHC: 33.4 g/dL (ref 31.5–35.7)
MCV: 82 fL (ref 79–97)
MONOCYTES: 8 %
Monocytes Absolute: 0.4 10*3/uL (ref 0.1–0.9)
NEUTROS PCT: 46 %
Neutrophils Absolute: 2.1 10*3/uL (ref 1.4–7.0)
PLATELETS: 171 10*3/uL (ref 150–379)
RBC: 4.48 x10E6/uL (ref 3.77–5.28)
RDW: 14.8 % (ref 12.3–15.4)
WBC: 4.6 10*3/uL (ref 3.4–10.8)

## 2015-07-19 LAB — BASIC METABOLIC PANEL
BUN/Creatinine Ratio: 12 (ref 11–26)
BUN: 8 mg/dL (ref 8–27)
CALCIUM: 9.3 mg/dL (ref 8.7–10.3)
CO2: 27 mmol/L (ref 18–29)
Chloride: 104 mmol/L (ref 97–108)
Creatinine, Ser: 0.67 mg/dL (ref 0.57–1.00)
GFR calc non Af Amer: 85 mL/min/{1.73_m2} (ref >59)
GFR, EST AFRICAN AMERICAN: 98 mL/min/{1.73_m2} (ref >59)
Glucose: 109 mg/dL — ABNORMAL HIGH (ref 65–99)
Potassium: 4.6 mmol/L (ref 3.5–5.2)
Sodium: 143 mmol/L (ref 134–144)

## 2015-08-02 ENCOUNTER — Ambulatory Visit (INDEPENDENT_AMBULATORY_CARE_PROVIDER_SITE_OTHER): Payer: Medicare Other | Admitting: Cardiology

## 2015-08-02 ENCOUNTER — Encounter: Payer: Self-pay | Admitting: Cardiology

## 2015-08-02 VITALS — BP 140/82 | HR 61 | Ht 67.75 in | Wt 175.8 lb

## 2015-08-02 DIAGNOSIS — E119 Type 2 diabetes mellitus without complications: Secondary | ICD-10-CM

## 2015-08-02 DIAGNOSIS — R079 Chest pain, unspecified: Secondary | ICD-10-CM | POA: Diagnosis not present

## 2015-08-02 DIAGNOSIS — E785 Hyperlipidemia, unspecified: Secondary | ICD-10-CM | POA: Diagnosis not present

## 2015-08-02 DIAGNOSIS — I10 Essential (primary) hypertension: Secondary | ICD-10-CM

## 2015-08-02 NOTE — Progress Notes (Signed)
PATIENT: Sherri Leon MRN: [id number] DOB: 09/17/1936 PCP: [name], MD  Clinic Note: Chief Complaint  Patient presents with  . New Patient (Initial Visit)    Ref. by Dr. Luan Pulling for chest pain. Pt. c/o chest pain and shortness of breath. Meds reviewed by the patient verbally.   . Chest Pain    HPI: PRISILA Leon is a 79 y.o. female with a PMH notable for type 2 diabetes, hypertension and hyperlipidemia below who presents today for cardiology evaluation for substernal chest pain. She was seen by [name], NP on August 22 for reevaluation of possible GERD/indigestion. She had been converted to twice a day omeprazole was also taking toms/Rolaids. She also noted significant belching. There is also description epigastric pain/chest heaviness with shortness of breath on exertion. The dyspnea on exertion with severely new over the past 2 weeks usually associated with going upstairs. Heaviness in her chest developed over the past month or so.  Her diabetes are relatively well controlled with an A1c of 6.6. She recently had a "life screen" study that only showed mild carotid disease but no aortic disease. She takes a statin and a moderate dose of ARB. Is not on any medications for diabetes.  Interval History: Sherri Leon presents today still having some mild episodes of chest discomfort. She says they happen mostly when she's at rest. They can And with exertion but she hasn't necessarily noted within the routine activities. The most notable episode was just recently in the morning when she was at church it lasted about 15-20 minutes. She another episode  Being just woke up in the morning was a heavy pressure sensation in the center chest but occasionally a sharp stabbing-type symptom. It's not a still associated with any nausea or dyspnea. She does have some exertional dyspnea, but none still associated with these spells. She says that this had been sharp pressure has been occurring for about  a month now off and on. She has never noted anything like this before. She denies a sensation of palpitations/rapid irregular heartbeats. No heart failure symptoms chest PND, orthopnea or significant edema. She does have some mild lower extremity edema but nothing significant. No syncope/near syncope or TIA/amaurosis fugax.   Past Medical History  Diagnosis Date  . Diabetes mellitus without complication   . Hyperlipidemia   . Hypertension     Prior Cardiac Evaluation and Past Surgical History: Past Surgical History  Procedure Laterality Date  . Hernia repair    . Appendectomy  1970  . Abdominal hysterectomy  1970  . Tonsillectomy  1955    No Known Allergies  Current Outpatient Prescriptions  Medication Sig Dispense Refill  . GARLIC PO Take by mouth.    . losartan (COZAAR) 50 MG tablet Take 1 tablet (50 mg total) by mouth daily. 90 tablet 3  . Multiple Vitamins-Minerals (MULTIVITAMIN ADULT PO) Take by mouth.    Marland Kitchen omeprazole (PRILOSEC) 20 MG capsule Take 1 capsule (20 mg total) by mouth 2 (two) times daily before a meal. 60 capsule 12  . pravastatin (PRAVACHOL) 40 MG tablet Take 1 tablet (40 mg total) by mouth daily. 90 tablet 3  . ranitidine (ZANTAC) 150 MG capsule Take 1 capsule (150 mg total) by mouth every evening. 30 capsule 11  . simethicone (MYLICON) 174 MG chewable tablet Chew 1 tablet (125 mg total) by mouth every 6 (six) hours as needed for flatulence. 30 tablet 0   No current facility-administered medications for this visit.  Social History   Social History  . Marital Status: Married    Spouse Name: N/A  . Number of Children: N/A  . Years of Education: N/A   Social History Main Topics  . Smoking status: Never Smoker   . Smokeless tobacco: Never Used  . Alcohol Use: No  . Drug Use: No  . Sexual Activity: Not Asked   Other Topics Concern  . None   Social History Narrative    family history includes Hypertension in her mother. mother also had lung  cancer. She is a smoker.  ROS: A comprehensive Review of Systems - was performed Review of Systems  Constitutional: Negative for fever, chills and malaise/fatigue.  HENT: Positive for congestion.   Respiratory: Negative for cough (With this congestion, but not routinely.) and shortness of breath.   Gastrointestinal: Negative for blood in stool and melena.  Musculoskeletal: Negative for myalgias and falls.  Neurological: Positive for headaches (With congestion).  Endo/Heme/Allergies: Does not bruise/bleed easily.  Psychiatric/Behavioral: The patient is not nervous/anxious.   All other systems reviewed and are negative.   PHYSICAL EXAM BP 140/82 mmHg  Pulse 61  Ht 5' 7.75" (1.721 m)  Wt 175 lb 12 oz (79.72 kg)  BMI 26.92 kg/m2 General appearance: alert, cooperative, appears stated age, no distress and Well-nourished and well-groomed. Neck: no adenopathy, no carotid bruit, no JVD and supple, symmetrical, trachea midline Lungs: clear to auscultation bilaterally, normal percussion bilaterally and Nonlabored, good air movement Heart: regular rate and rhythm, S1, S2 normal, no murmur, click, rub or gallop and normal apical impulse Abdomen: soft, non-tender; bowel sounds normal; no masses,  no organomegaly Extremities: extremities normal, atraumatic, no cyanosis or edema and no ulcers, gangrene or trophic changes Pulses: 2+ and symmetric Skin: Skin color, texture, turgor normal. No rashes or lesions Neurologic: Alert and oriented X 3, normal strength and tone. Normal symmetric reflexes. Normal coordination and gait; CN II-12 grossly intact   Adult ECG Report  Rate: 62 ;  Rhythm: normal sinus rhythm and Nonspecific ST abnormality. Only noted in lead III  QRS Axis: 19 ;  PR Interval: 152 ;  QRS Duration: 86 ; QTc: 442;  Voltages: normal;  Conduction Disturbances: none  Other Abnormalities: Non-specific ST-T abnormality   Narrative Interpretation: Essentially normal EKG  Recent Labs:  Reviewed in Epic Lab Results  Component Value Date   CREATININE 0.67 07/18/2015   Lab Results  Component Value Date   WBC 4.6 07/18/2015   HCT 36.5 07/18/2015    No results found for: CHOL, HDL, LDLCALC, LDLDIRECT, TRIG, CHOLHDL  ASSESSMENT / PLAN: Pleasant 79 year old woman who appears very healthy. She has well-controlled hypertension and hyperlipidemia. She presents with chest pain that is at least moderate risk for cardiac etiology based on her cardiac risk factors and age.  Problem List Items Addressed This Visit    Chest pain with moderate risk for cardiac etiology - Primary    There are some atypical features saw him as well as atypical features for her symptoms. The location and the description certainly sounds like it could be consistent with angina. It happens mostly at rest, but does sometimes occur with exertion. She does have some dyspnea with exertion. She has controlled diabetes, dyslipidemia and hypertension and is now close to 79 years old. This has to be considered at least moderate risk chest pain.  Plan: Stress Echocardiogram      Relevant Orders   EKG 12-Lead   Echo stress   Diabetes mellitus type II,  controlled; last hemoglobin A1c 6.6 (Chronic)     Currently not on any medications.diet-controlled and monitored by PCP.      Essential hypertension (Chronic)    Borderline blood pressure control with the current dose of Cozaar. She is somewhat nervous about being in the cardiology office, so we will reassess at followup.      Relevant Orders   EKG 12-Lead   Hyperlipidemia    On a statin. I don't have Recent lipids.  These have been monitored by her PCP.         No orders of the defined types were placed in this encounter.    Followup: ~1 months -- to discuss stress test results    Leonie Man, M.D., M.S.  [employer]  [address] [place], [place] [address] [phone] Fax [phone]

## 2015-08-02 NOTE — Patient Instructions (Signed)
Medication Instructions:  Your physician recommends that you continue on your current medications as directed. Please refer to the Current Medication list given to you today.   Labwork: none  Testing/Procedures: Stress echo cardiogram Exercise Stress Echocardiogram An exercise stress echocardiogram is a heart (cardiac) test used to check the function of your heart. This test may also be called an exercise stress echocardiography or stress echo. This stress test will check how well your heart muscle and valves are working and determine if your heart muscle is getting enough blood. You will exercise on a treadmill to naturally increase or stress the functioning of your heart.  An echocardiogram uses sound waves (ultrasound) to produce an image of your heart. If your heart does not work normally, it may indicate coronary artery disease with poor coronary blood supply. The coronary arteries are the arteries that bring blood and oxygen to your heart. LET [facility] CARE PROVIDER KNOW ABOUT:  Any allergies you have.  All medicines you are taking, including vitamins, herbs, eye drops, creams, and over-the-counter medicines.  Previous problems you or members of your family have had with the use of anesthetics.  Any blood disorders you have.  Previous surgeries you have had.  Medical conditions you have.  Possibility of pregnancy, if this applies. RISKS AND COMPLICATIONS Generally, this is a safe procedure. However, as with any procedure, complications can occur. Possible complications can include:  You develop pain or pressure in the following areas:  Chest.  Jaw or neck.  Between your shoulder blades.  Radiating down your left arm.  Dizziness or lightheadedness.  Shortness of breath.  Increased or irregular heartbeat.  Nausea or vomiting.  Heart attack (rare). BEFORE THE PROCEDURE  Avoid all forms of caffeine for 24 hours before your test or as directed by your health care  provider. This includes coffee, tea (even decaffeinated tea), caffeinated sodas, chocolate, cocoa, and certain pain medicines.  Follow your health care provider's instructions regarding eating and drinking before the test.  Take your medicines as directed at regular times with water unless instructed otherwise. Exceptions may include:  If you have diabetes, ask how you are to take your insulin or pills. It is common to adjust insulin dosing the morning of the test.  If you are taking beta-blocker medicines, it is important to talk to your health care provider about these medicines well before the date of your test. Taking beta-blocker medicines may interfere with the test. In some cases, these medicines need to be changed or stopped 24 hours or more before the test.  If you wear a nitroglycerin patch, it may need to be removed prior to the test. Ask your health care provider if the patch should be removed before the test.  If you use an inhaler for any breathing condition, bring it with you to the test.  If you are an outpatient, bring a snack so you can eat right after the stress phase of the test.  Do not smoke for 4 hours prior to the test or as directed by your health care provider.  Wear loose-fitting clothes and comfortable shoes for the test. This test involves walking on a treadmill. PROCEDURE   Multiple electrodes will be put on your chest. If needed, small areas of your chest may be shaved to get better contact with the electrodes. Once the electrodes are attached to your body, multiple wires will be attached to the electrodes, and your heart rate will be monitored.  You will have  an echocardiogram done at rest.  To produce this image of your heart, gel is applied to your chest, and a wand-like tool (transducer) is moved over the chest. The transducer sends the sound waves through the chest to create the moving images of your heart.  You may need an IV to receive a medication  that improves the quality of the pictures.  You will then walk on a treadmill. The treadmill will be started at a slow pace. The treadmill speed and incline will gradually be increased to raise your heart rate.  At the peak of exercise, the treadmill will be stopped. You will lie down immediately on a bed so that a second echocardiogram can be done to visualize your heart's motion with exercise.  The test usually takes 30-60 minutes to complete. AFTER THE PROCEDURE  Your heart rate and blood pressure will be monitored after the test.  You may return to your normal schedule, including diet, activities, and medicines, unless your health care provider tells you otherwise. Document Released: 11/08/2004 Document Revised: 11/09/2013 Document Reviewed: 07/12/2013 [facility] Patient Information 2015 [place], [place]. This information is not intended to replace advice given to you by your health care provider. Make sure you discuss any questions you have with your health care provider.    Follow-Up: Your physician recommends that you schedule a follow-up appointment with Dr. [name] after stress echo.   Any Other Special Instructions Will Be Listed Below (If Applicable).

## 2015-08-04 ENCOUNTER — Encounter: Payer: Self-pay | Admitting: Cardiology

## 2015-08-04 DIAGNOSIS — R079 Chest pain, unspecified: Secondary | ICD-10-CM | POA: Insufficient documentation

## 2015-08-04 NOTE — Assessment & Plan Note (Signed)
There are some atypical features saw him as well as atypical features for her symptoms. The location and the description certainly sounds like it could be consistent with angina. It happens mostly at rest, but does sometimes occur with exertion. She does have some dyspnea with exertion. She has controlled diabetes, dyslipidemia and hypertension and is now close to 79 years old. This has to be considered at least moderate risk chest pain.  Plan: Stress Echocardiogram

## 2015-08-04 NOTE — Assessment & Plan Note (Signed)
Borderline blood pressure control with the current dose of Cozaar. She is somewhat nervous about being in the cardiology office, so we will reassess at followup.

## 2015-08-04 NOTE — Assessment & Plan Note (Signed)
On a statin. I don't have Recent lipids.  These have been monitored by her PCP.

## 2015-08-04 NOTE — Assessment & Plan Note (Signed)
Currently not on any medications.diet-controlled and monitored by PCP.

## 2015-08-09 ENCOUNTER — Encounter: Payer: Self-pay | Admitting: Cardiology

## 2015-08-11 ENCOUNTER — Other Ambulatory Visit: Payer: Self-pay | Admitting: Cardiology

## 2015-08-11 ENCOUNTER — Ambulatory Visit (INDEPENDENT_AMBULATORY_CARE_PROVIDER_SITE_OTHER): Payer: Medicare Other

## 2015-08-11 DIAGNOSIS — R0609 Other forms of dyspnea: Secondary | ICD-10-CM

## 2015-08-11 DIAGNOSIS — R079 Chest pain, unspecified: Secondary | ICD-10-CM

## 2015-08-13 LAB — ECHOCARDIOGRAM STRESS TEST
CSEPEDS: 46 s
Estimated workload: 10.1 METS
Exercise duration (min): 8 min
MPHR: 143 {beats}/min
Peak HR: 139 {beats}/min
Percent HR: 97 %
Rest HR: 73 {beats}/min

## 2015-08-17 ENCOUNTER — Ambulatory Visit (INDEPENDENT_AMBULATORY_CARE_PROVIDER_SITE_OTHER): Payer: Medicare Other | Admitting: Physician Assistant

## 2015-08-17 ENCOUNTER — Encounter: Payer: Self-pay | Admitting: Physician Assistant

## 2015-08-17 VITALS — BP 114/68 | HR 62 | Ht 67.0 in | Wt 174.8 lb

## 2015-08-17 DIAGNOSIS — I10 Essential (primary) hypertension: Secondary | ICD-10-CM | POA: Diagnosis not present

## 2015-08-17 DIAGNOSIS — R9439 Abnormal result of other cardiovascular function study: Secondary | ICD-10-CM

## 2015-08-17 DIAGNOSIS — E119 Type 2 diabetes mellitus without complications: Secondary | ICD-10-CM

## 2015-08-17 DIAGNOSIS — R079 Chest pain, unspecified: Secondary | ICD-10-CM

## 2015-08-17 DIAGNOSIS — R0602 Shortness of breath: Secondary | ICD-10-CM

## 2015-08-17 DIAGNOSIS — E785 Hyperlipidemia, unspecified: Secondary | ICD-10-CM

## 2015-08-17 NOTE — Patient Instructions (Addendum)
Medication Instructions:  Please continue your current medications  Labwork: BMET, CBC, PT/INR  Testing/Procedures: Your physician has requested that you have a cardiac catheterization. Cardiac catheterization is used to diagnose and/or treat various heart conditions. Doctors may recommend this procedure for a number of different reasons. The most common reason is to evaluate chest pain. Chest pain can be a symptom of coronary artery disease (CAD), and cardiac catheterization can show whether plaque is narrowing or blocking your heart's arteries. This procedure is also used to evaluate the valves, as well as measure the blood flow and oxygen levels in different parts of your heart.  Follow-Up: After your cath  Any Other Special Instructions Will Be Listed Below  [facility] Cardiac Cath Instructions   You are scheduled for a Cardiac Cath on:_Wednesday, October 5___  Please arrive at 6:30 am on the day of your procedure  You will need to pre-register prior to the day of your procedure.  Enter through the [employer] at [facility].  Registration is the first desk on your right.  Please take the procedure order we have given you in order to be registered appropriately  Do not eat/drink anything after midnight  Someone will need to drive you home  It is recommended someone be with you for the first 24 hours after your procedure  Wear clothes that are easy to get on/off and wear slip on shoes if possible   Medications bring a current list of all medications with you  _X_ Do not take these medications before your procedure:_____LOSARTAN____  Day of your procedure: Arrive at the [place] entrance.  Free valet service is available.  After entering the [place] please check-in at the registration desk (1st desk on your right) to receive your armband. After receiving your armband someone will escort you to the cardiac cath/special procedures waiting area.  The usual length of stay after your  procedure is about 2 to 3 hours.  This can vary.  If you have any questions, please call our office at [phone], or you may call the cardiac cath lab at [facility] directly at [phone]  Angiogram An angiogram, also called angiography, is a procedure used to look at the blood vessels that carry blood to different parts of your body (arteries). In this procedure, dye is injected through a long, thin tube (catheter) into an artery. X-rays are then taken. The X-rays will show if there is a blockage or problem in a blood vessel.  LET [facility] CARE PROVIDER KNOW ABOUT: 8. Any allergies you have, including allergies to shellfish or contrast dye.  9. All medicines you are taking, including vitamins, herbs, eye drops, creams, and over-the-counter medicines.  10. Previous problems you or members of your family have had with the use of anesthetics.  11. Any blood disorders you have.  12. Previous surgeries you have had. 13. Any previous kidney problems or failure you have had. 14. Medical conditions you have.  15. Possibility of pregnancy, if this applies. RISKS AND COMPLICATIONS Generally, an angiogram is a safe procedure. However, as with any procedure, problems can occur. Possible problems include:  Injury to the blood vessels, including rupture or bleeding.  Infection or bruising at the catheter site.  Allergic reaction to the dye or contrast used.  Kidney damage from the dye or contrast used.  Blood clots that can lead to a stroke or heart attack. BEFORE THE PROCEDURE  Do not eat or drink after midnight on the night before the procedure, or  as directed by your health care provider.   Ask your health care provider if you may drink enough water to take any needed medicines the morning of the procedure.  PROCEDURE  You may be given a medicine to help you relax (sedative) before and during the procedure. This medicine is given through an IV access tube that is inserted into one  of your veins.   The area where the catheter will be inserted will be washed and shaved. This is usually done in the groin but may be done in the fold of your arm (near your elbow) or in the wrist.  A medicine will be given to numb the area where the catheter will be inserted (local anesthetic).  The catheter will be inserted with a guide wire into an artery. The catheter is guided by using a type of X-ray (fluoroscopy) to the blood vessel being examined.   Dye is then injected into the catheter, and X-rays are taken. The dye helps to show where any narrowing or blockages are located.  AFTER THE PROCEDURE   If the procedure is done through the leg, you will be kept in bed lying flat for several hours. You will be instructed to not bend or cross your legs.  The insertion site will be checked frequently.  The pulse in your feet or wrist will be checked frequently.  Additional blood tests, X-rays, and electrocardiography may be done.   You may need to stay in the hospital overnight for observation.  Document Released: 08/14/2005 Document Revised: 11/09/2013 Document Reviewed: 04/07/2013 [facility] Patient Information 2015 [place], [place]. This information is not intended to replace advice given to you by your health care provider. Make sure you discuss any questions you have with your health care provider.

## 2015-08-17 NOTE — Progress Notes (Signed)
Cardiology Office Note:  Date of Encounter: 08/17/2015  ID: Sherri Leon, DOB Nov 11, 1936, MRN [id number]  PCP:  [name], MD Primary Cardiologist:  Dr. [name], MD  Chief Complaint  Patient presents with  . other    Discuss stress test result C/o chest pain "feels like indigestion", leg swelling and sweating. Meds reviewed verbally with pt.    HPI:  79 y.o. female with a PMH notable for type 2 diabetes, hypertension and hyperlipidemia who presents today for follow up of recent abnormal stress echo on 08/11/2015 for atypical chest pain and exertional dyspnea.   She was seen by [name], NP on August 22 for reevaluation of possible GERD/indigestion. She had been converted to twice a day omeprazole, though is only taking this once daily currently at the times of today's office visit, and was also taking TUMs/Rolaids. She also noted significant belching. There is also description of epigastric pain/chest heaviness with shortness of breath on exertion. The dyspnea on exertion is usually associated with going upstairs. Heaviness in her chest developed over the past month or so and is not associated with exertion.  Her diabetes is relatively well controlled with an A1c of 6.6%. She recently had a "life screen" study that only showed mild carotid disease but no aortic disease. She takes a statin and a moderate dose of ARB. Is not on any medications for diabetes.  At her cardiology evaluation she was still having some mild episodes of chest discomfort, mostly at rest. She says they happen mostly when she's at rest, lasting around 15-20 minutes. She also noted exertional dyspnea, mostly when walking up the stairs. Resting improves the dyspnea. No nausea, vomiting, palpitations, presyncope, syncope, or diaphoresis. She has never noted anything like this before. She underwent stress echo based on her symptoms on 9/23 that showed 1 mm horizontal depression in inferior leads and V5-V6  lasting 5 minutes into recovery. Duke scoring: exercise time of 8 minutes; maximum ST deviation of 1 mm; no angina resulting of a score of 3, leading to a moderate risk of cardiac events. Staged echo showed findings were positive for stress-induced ischemia. At peak stress there was probable hypokinesis of the mid inferoseptal and mid inferior myocardium.   Since this stress test she reports she has continued to live her active lifestyle. She is still SOB with going up the stairs. She occasionally gets chest pain while at church. She states this pain feels like "there is something in there." She is currently chest pain free. Weight has been stable. She has long standing LEE. No cough, abdominal fullness, orthopnea, early satiety, PND, palpitations, nausea, vomiting, SOB, or presyncope.      Past Medical History  Diagnosis Date  . Diabetes mellitus without complication   . Hyperlipidemia   . Hypertension   :  Past Surgical History  Procedure Laterality Date  . Abdominal hysterectomy    . Hernia repair    . Appendectomy  1970  . Abdominal hysterectomy  1970  . Tonsillectomy  1955  :  Social History:  The patient  reports that she has never smoked. She has never used smokeless tobacco. She reports that she does not drink alcohol or use illicit drugs.   Family History  Problem Relation Age of Onset  . Hypertension Mother      Allergies:  No Known Allergies   Home Medications:  Current Outpatient Prescriptions  Medication Sig Dispense Refill  . ALLOPURINOL PO Take by mouth daily.    Marland Kitchen  GARLIC PO Take by mouth.    . losartan (COZAAR) 50 MG tablet Take 1 tablet (50 mg total) by mouth daily. 90 tablet 3  . Multiple Vitamins-Minerals (MULTIVITAMIN ADULT PO) Take by mouth.    Marland Kitchen omeprazole (PRILOSEC) 20 MG capsule Take 1 capsule (20 mg total) by mouth 2 (two) times daily before a meal. 60 capsule 12  . pravastatin (PRAVACHOL) 40 MG tablet Take 1 tablet (40 mg total) by mouth daily.  (Patient taking differently: Take 20 mg by mouth daily. ) 90 tablet 3  . simethicone (MYLICON) 628 MG chewable tablet Chew 1 tablet (125 mg total) by mouth every 6 (six) hours as needed for flatulence. 30 tablet 0   No current facility-administered medications for this visit.     Review of Systems:  Review of Systems  Constitutional: Positive for malaise/fatigue. Negative for fever, chills, weight loss and diaphoresis.  HENT: Negative for congestion.   Eyes: Negative for discharge and redness.  Respiratory: Negative for cough, hemoptysis, sputum production, shortness of breath and wheezing.   Cardiovascular: Positive for chest pain and leg swelling. Negative for palpitations, orthopnea, claudication and PND.  Gastrointestinal: Positive for heartburn. Negative for nausea, vomiting, abdominal pain, blood in stool and melena.  Genitourinary: Negative for hematuria.  Musculoskeletal: Negative for myalgias and falls.  Skin: Negative for rash.  Neurological: Negative for dizziness, tingling, tremors, sensory change, speech change, focal weakness, seizures, loss of consciousness and weakness.  Endo/Heme/Allergies: Does not bruise/bleed easily.  Psychiatric/Behavioral: Negative for depression and substance abuse. The patient is not nervous/anxious.      Physical Exam:  Blood pressure 114/68, pulse 62, height 5\' 7"  (1.702 m), weight 174 lb 12 oz (79.266 kg). BMI: Body mass index is 27.36 kg/(m^2). General: Pleasant, NAD. Psych: Normal affect. Responds to questions with normal affect.  Neuro: Alert and oriented X 3. Moves all extremities spontaneously. HEENT: Normocephalic, atraumatic. EOM intact. Sclera anicteric.  Neck: Trachea midline. Supple without bruits or JVD. Lungs:  Respirations regular and unlabored. CTA bilaterally without wheezing, crackles, or rhonchi.  Heart: RRR, normal s3, s4. No murmurs, rubs, or gallops.  Abdomen: Soft, non-tender, non-distended, BS + x 4.  Extremities:  No clubbing, cyanosis or edema. DP/PT/Radials 2+ and equal bilaterally.   Accessory Clinical Findings:  EKG: NSR with sinus arrhythmia and rare PVC, 62 bpm, low voltage QRS, nonspecific inferolateral st/t abnormality    Recent Labs: 07/18/2015: BUN 8; Creatinine, Ser 0.67; Potassium 4.6; Sodium 143  No results found for requested labs within last 365 days.  CrCl cannot be calculated (Patient has no serum creatinine result on file.).  Weights: Wt Readings from Last 3 Encounters:  08/17/15 174 lb 12 oz (79.266 kg)  08/02/15 175 lb 12 oz (79.72 kg)  07/10/15 176 lb 6.4 oz (80.015 kg)    Other studies Reviewed: Additional studies/ records that were reviewed today include: prior office note.  Assessment & Plan:  1. Class III angina/abnormal stress echo/dyspnea on exertion: -Schedule cardiac cath with Dr. [name], MD for 08/23/2015 -Hold losartan pre-cath -If cath shows nonobstructive CAD consider GI  -Risks and benefits of cardiac catheterization have been discussed with the patient including risks of bleeding, bruising, infection, kidney damage, stroke, heart attack, and death. The patient understands these risks and is willing to proceed with the procedure. All questions have been answered and concerns listened to.   2. HTN: -Controlled -Continue losartan, except for holding pre-cath as above  3. HLD: -Continue pravastatin 40 mg qhs -If needs PCI, would  consider changing to Lipitor post checking lipid panel in the hospital   4. DM2: -Controlled -Not on any medications, including metformin    Dispo: -Follow up post cardiac cath  Current medicines are reviewed at length with the patient today.  The patient did not have any concerns regarding medicines.   Christell Faith, PA-C [address] [place] [place], [place] [address] [phone] [facility] 08/17/2015, 2:49 PM

## 2015-08-18 LAB — BASIC METABOLIC PANEL
BUN/Creatinine Ratio: 14 (ref 11–26)
BUN: 12 mg/dL (ref 8–27)
CHLORIDE: 104 mmol/L (ref 97–108)
CO2: 26 mmol/L (ref 18–29)
Calcium: 9.6 mg/dL (ref 8.7–10.3)
Creatinine, Ser: 0.86 mg/dL (ref 0.57–1.00)
GFR calc non Af Amer: 65 mL/min/{1.73_m2} (ref >59)
GFR, EST AFRICAN AMERICAN: 75 mL/min/{1.73_m2} (ref >59)
GLUCOSE: 117 mg/dL — AB (ref 65–99)
POTASSIUM: 4.8 mmol/L (ref 3.5–5.2)
Sodium: 143 mmol/L (ref 134–144)

## 2015-08-18 LAB — CBC WITH DIFFERENTIAL/PLATELET
BASOS ABS: 0 10*3/uL (ref 0.0–0.2)
Basos: 0 %
EOS (ABSOLUTE): 0.1 10*3/uL (ref 0.0–0.4)
Eos: 3 %
Hematocrit: 37.3 % (ref 34.0–46.6)
Hemoglobin: 12.2 g/dL (ref 11.1–15.9)
IMMATURE GRANS (ABS): 0 10*3/uL (ref 0.0–0.1)
Immature Granulocytes: 0 %
Lymphocytes Absolute: 2.4 10*3/uL (ref 0.7–3.1)
Lymphs: 42 %
MCH: 26.8 pg (ref 26.6–33.0)
MCHC: 32.7 g/dL (ref 31.5–35.7)
MCV: 82 fL (ref 79–97)
Monocytes Absolute: 0.4 10*3/uL (ref 0.1–0.9)
Monocytes: 7 %
NEUTROS ABS: 2.7 10*3/uL (ref 1.4–7.0)
NEUTROS PCT: 48 %
PLATELETS: 189 10*3/uL (ref 150–379)
RBC: 4.55 x10E6/uL (ref 3.77–5.28)
RDW: 15 % (ref 12.3–15.4)
WBC: 5.6 10*3/uL (ref 3.4–10.8)

## 2015-08-18 LAB — PROTIME-INR
INR: 1 (ref 0.8–1.2)
Prothrombin Time: 10.4 s (ref 9.1–12.0)

## 2015-08-23 ENCOUNTER — Encounter
Admission: RE | Disposition: A | Discharge status: Home / Self Care | Payer: Self-pay | Source: Ambulatory Visit | Attending: Cardiology

## 2015-08-23 ENCOUNTER — Encounter: Payer: Self-pay | Admitting: *Deleted

## 2015-08-23 ENCOUNTER — Ambulatory Visit
Admission: RE | Admit: 2015-08-23 | Discharge: 2015-08-23 | Disposition: A | Discharge status: Home / Self Care | Payer: Medicare Other | Source: Ambulatory Visit | Attending: Cardiology | Admitting: Cardiology

## 2015-08-23 DIAGNOSIS — E785 Hyperlipidemia, unspecified: Secondary | ICD-10-CM | POA: Diagnosis present

## 2015-08-23 DIAGNOSIS — Z79899 Other long term (current) drug therapy: Secondary | ICD-10-CM | POA: Diagnosis not present

## 2015-08-23 DIAGNOSIS — R9439 Abnormal result of other cardiovascular function study: Secondary | ICD-10-CM | POA: Diagnosis present

## 2015-08-23 DIAGNOSIS — R931 Abnormal findings on diagnostic imaging of heart and coronary circulation: Secondary | ICD-10-CM | POA: Diagnosis not present

## 2015-08-23 DIAGNOSIS — K219 Gastro-esophageal reflux disease without esophagitis: Secondary | ICD-10-CM | POA: Diagnosis present

## 2015-08-23 DIAGNOSIS — R0609 Other forms of dyspnea: Secondary | ICD-10-CM | POA: Insufficient documentation

## 2015-08-23 DIAGNOSIS — R1013 Epigastric pain: Secondary | ICD-10-CM | POA: Insufficient documentation

## 2015-08-23 DIAGNOSIS — I10 Essential (primary) hypertension: Secondary | ICD-10-CM | POA: Diagnosis present

## 2015-08-23 DIAGNOSIS — E119 Type 2 diabetes mellitus without complications: Secondary | ICD-10-CM

## 2015-08-23 DIAGNOSIS — Z8249 Family history of ischemic heart disease and other diseases of the circulatory system: Secondary | ICD-10-CM | POA: Insufficient documentation

## 2015-08-23 DIAGNOSIS — Z90710 Acquired absence of both cervix and uterus: Secondary | ICD-10-CM | POA: Diagnosis not present

## 2015-08-23 DIAGNOSIS — I209 Angina pectoris, unspecified: Secondary | ICD-10-CM | POA: Diagnosis present

## 2015-08-23 DIAGNOSIS — R0789 Other chest pain: Secondary | ICD-10-CM | POA: Insufficient documentation

## 2015-08-23 HISTORY — PX: CARDIAC CATHETERIZATION: SHX172

## 2015-08-23 SURGERY — LEFT HEART CATH
Anesthesia: Moderate Sedation

## 2015-08-23 MED ORDER — SODIUM CHLORIDE 0.9 % WEIGHT BASED INFUSION: 1.0000 mL/kg/h | INTRAVENOUS | Status: DC

## 2015-08-23 MED ORDER — MIDAZOLAM HCL 2 MG/2ML IJ SOLN
INTRAMUSCULAR | Status: DC | PRN
  Administered 2015-08-23: 1 mg via INTRAVENOUS

## 2015-08-23 MED ORDER — HEPARIN (PORCINE) IN NACL 2-0.9 UNIT/ML-% IJ SOLN
INTRAMUSCULAR | Status: AC
  Filled 2015-08-23: qty 1000

## 2015-08-23 MED ORDER — VERAPAMIL HCL 2.5 MG/ML IV SOLN
INTRAVENOUS | Status: AC
  Filled 2015-08-23: qty 2

## 2015-08-23 MED ORDER — ONDANSETRON HCL 4 MG/2ML IJ SOLN: 4.0000 mg | Freq: Four times a day (QID) | INTRAMUSCULAR | Status: DC | PRN

## 2015-08-23 MED ORDER — MIDAZOLAM HCL 2 MG/2ML IJ SOLN
INTRAMUSCULAR | Status: AC
  Filled 2015-08-23: qty 2

## 2015-08-23 MED ORDER — SODIUM CHLORIDE 0.9 % IV SOLN
250.0000 mL | INTRAVENOUS | Status: DC | PRN
Start: 2015-08-23 — End: 2015-08-23

## 2015-08-23 MED ORDER — IOHEXOL 300 MG/ML  SOLN
INTRAMUSCULAR | Status: DC | PRN
  Administered 2015-08-23: 85 mL via INTRA_ARTERIAL

## 2015-08-23 MED ORDER — HEPARIN SODIUM (PORCINE) 1000 UNIT/ML IJ SOLN
INTRAMUSCULAR | Status: DC | PRN
  Administered 2015-08-23: 4000 [IU] via INTRAVENOUS

## 2015-08-23 MED ORDER — FENTANYL CITRATE (PF) 100 MCG/2ML IJ SOLN
INTRAMUSCULAR | Status: DC | PRN
  Administered 2015-08-23: 50 ug via INTRAVENOUS

## 2015-08-23 MED ORDER — SODIUM CHLORIDE 0.9 % IJ SOLN: 3.0000 mL | Freq: Two times a day (BID) | INTRAMUSCULAR | Status: DC

## 2015-08-23 MED ORDER — ASPIRIN EC 81 MG PO TBEC: 81.0000 mg | DELAYED_RELEASE_TABLET | Freq: Every day | ORAL | Status: DC

## 2015-08-23 MED ORDER — FENTANYL CITRATE (PF) 100 MCG/2ML IJ SOLN
INTRAMUSCULAR | Status: AC
  Filled 2015-08-23: qty 2

## 2015-08-23 MED ORDER — SODIUM CHLORIDE 0.9 % IJ SOLN: 3.0000 mL | INTRAMUSCULAR | Status: DC | PRN

## 2015-08-23 MED ORDER — LOSARTAN POTASSIUM 50 MG PO TABS: 50.0000 mg | ORAL_TABLET | Freq: Every day | ORAL | Status: DC

## 2015-08-23 MED ORDER — SODIUM CHLORIDE 0.9 % IV SOLN
INTRAVENOUS | Status: DC
  Administered 2015-08-23: 07:00:00 via INTRAVENOUS

## 2015-08-23 MED ORDER — ACETAMINOPHEN 325 MG PO TABS: 650.0000 mg | ORAL_TABLET | ORAL | Status: DC | PRN

## 2015-08-23 MED ORDER — HEPARIN SODIUM (PORCINE) 1000 UNIT/ML IJ SOLN
INTRAMUSCULAR | Status: AC
  Filled 2015-08-23: qty 1

## 2015-08-23 SURGICAL SUPPLY — 7 items
CATH 5F 110X4 TIG (CATHETERS) ×4 IMPLANT
CATH INFINITI 5FR ANG PIGTAIL (CATHETERS) ×4 IMPLANT
DEVICE RAD TR BAND REGULAR (VASCULAR PRODUCTS) ×4 IMPLANT
GLIDESHEATH SLEND A-KIT 6F 22G (SHEATH) ×4 IMPLANT
KIT MANI 3VAL PERCEP (MISCELLANEOUS) ×4 IMPLANT
PACK CARDIAC CATH (CUSTOM PROCEDURE TRAY) ×4 IMPLANT
WIRE SAFE-T 1.5MM-J .035X260CM (WIRE) ×4 IMPLANT

## 2015-08-23 NOTE — OR Nursing (Signed)
TR band on but now deflated, hand remains mottle and numb, cath lab staff in to reevaluate and Dr. [name] notified and will be in to check site

## 2015-08-23 NOTE — Interval H&P Note (Signed)
History and Physical Interval Note:  08/23/2015 7:36 AM  Sherri Leon  has presented today for surgery, with the diagnosis of Class III Angina with Abnormal Echocardiogram Stress Test. Study Conclusions  - Stress ECG conclusions: The stress ECG was consistent with myocardial ischemia. 1 mm of horizontal depression in inferior leads and V 5-V6 with lasted for 5 minutes in recovery. Duke scoring: exercise time of 8 min; maximum ST deviation of 1 mm; no angina; resulting score is 3. This score predicts a moderate risk of cardiac events. - Staged echo: Findings were positive for stress-induced ischemia. - Peak stress: Probable hypokinesis of the mid inferoseptal and mid inferior LV myocardium. This was mainly obvious in short axis images.  The various methods of treatment have been discussed with the patient and family. . After consideration of risks, benefits and other options for treatment, the patient has consented to   Procedure(s): Left Heart Cath (N/A) as a surgical intervention .    The patient's history has been reviewed, patient examined, no change in status, stable for surgery.  I have reviewed the patient's chart and labs.  Questions were answered to the patient's satisfaction.     [place], [place]  Cath Lab Visit (complete for each Cath Lab visit)  Clinical Evaluation Leading to the Procedure:   ACS: No.  Non-ACS:    Anginal Classification: CCS III  Anti-ischemic medical therapy: Minimal Therapy (1 class of medications)  Non-Invasive Test Results: Intermediate-risk stress test findings: cardiac mortality 1-3%/year  Prior CABG: No previous CABG  Ischemic Symptoms? CCS III (Marked limitation of ordinary activity) Anti-ischemic Medical Therapy? Minimal Therapy (1 class of medications) Non-invasive Test Results? Intermediate-risk stress test findings: cardiac mortality 1-3%/year Prior CABG? No Previous CABG   Patient Information:   1-2V CAD, no  prox LAD  U (6)  Indication: 16; Score: 6   Patient Information:   CTO of 1 vessel, no other CAD  U (6)  Indication: 26; Score: 6   Patient Information:   1V CAD with prox LAD  A (7)  Indication: 32; Score: 7   Patient Information:   2V-CAD with prox LAD  A (8)  Indication: 38; Score: 8   Patient Information:   3V-CAD without LMCA  A (8)  Indication: 44; Score: 8   Patient Information:   3V-CAD without LMCA With Abnormal LV systolic function  A (9)  Indication: 48; Score: 9   Patient Information:   LMCA-CAD  A (9)  Indication: 49; Score: 9   Patient Information:   2V-CAD with prox LAD PCI  A (7)  Indication: 62; Score: 7   Patient Information:   2V-CAD with prox LAD CABG  A (8)  Indication: 62; Score: 8   Patient Information:   3V-CAD without LMCA With Low CAD burden(i.e., 3 focal stenoses, low SYNTAX score) PCI  A (7)  Indication: 63; Score: 7   Patient Information:   3V-CAD without LMCA With Low CAD burden(i.e., 3 focal stenoses, low SYNTAX score) CABG  A (9)  Indication: 63; Score: 9   Patient Information:   3V-CAD without LMCA E06c - Intermediate-high CAD burden (i.e., multiple diffuse lesions, presence of CTO, or high SYNTAX score) PCI  U (4)  Indication: 64; Score: 4   Patient Information:   3V-CAD without LMCA E06c - Intermediate-high CAD burden (i.e., multiple diffuse lesions, presence of CTO, or high SYNTAX score) CABG  A (9)  Indication: 64; Score: 9   Patient Information:   LMCA-CAD With  Isolated LMCA stenosis  PCI  U (6)  Indication: 65; Score: 6   Patient Information:   LMCA-CAD Additional CAD, low CAD burden (i.e., 1- to 2-vessel additional involvement, low SYNTAX score) PCI  U (5)  Indication: 66; Score: 5   Patient Information:   LMCA-CAD Additional CAD, low CAD burden (i.e., 1- to 2-vessel additional involvement, low SYNTAX score) CABG  A (9)  Indication: 66; Score:  9   Patient Information:   LMCA-CAD With Isolated LMCA stenosis  CABG  A (9)  Indication: 66; Score: 9   Patient Information:   LMCA-CAD Additional CAD, intermediate-high CAD burden (i.e., 3-vessel involvement, presence of CTO, or high SYNTAX score) PCI  I (3)  Indication: 67; Score: 3   Patient Information:   LMCA-CAD Additional CAD, intermediate-high CAD burden (i.e., 3-vessel involvement, presence of CTO, or high SYNTAX score) CABG  A (9)  Indication: 67; Score: 9    Kiaraliz Rafuse W, M.D., M.S. Interventional Cardiologist   Pager # [phone]

## 2015-08-23 NOTE — OR Nursing (Signed)
Dr. [name] in, band loosen. Numbness starting to subside

## 2015-08-23 NOTE — H&P (View-Only) (Signed)
Cardiology Office Note:  Date of Encounter: 08/17/2015  ID: NAESHA Leon, DOB 08-24-1936, MRN [id number]  PCP:  [name], MD Primary Cardiologist:  Dr. [name], MD  Chief Complaint  Patient presents with  . other    Discuss stress test result C/o chest pain "feels like indigestion", leg swelling and sweating. Meds reviewed verbally with pt.    HPI:  79 y.o. female with a PMH notable for type 2 diabetes, hypertension and hyperlipidemia who presents today for follow up of recent abnormal stress echo on 08/11/2015 for atypical chest pain and exertional dyspnea.   She was seen by [name], NP on August 22 for reevaluation of possible GERD/indigestion. She had been converted to twice a day omeprazole, though is only taking this once daily currently at the times of today's office visit, and was also taking TUMs/Rolaids. She also noted significant belching. There is also description of epigastric pain/chest heaviness with shortness of breath on exertion. The dyspnea on exertion is usually associated with going upstairs. Heaviness in her chest developed over the past month or so and is not associated with exertion.  Her diabetes is relatively well controlled with an A1c of 6.6%. She recently had a "life screen" study that only showed mild carotid disease but no aortic disease. She takes a statin and a moderate dose of ARB. Is not on any medications for diabetes.  At her cardiology evaluation she was still having some mild episodes of chest discomfort, mostly at rest. She says they happen mostly when she's at rest, lasting around 15-20 minutes. She also noted exertional dyspnea, mostly when walking up the stairs. Resting improves the dyspnea. No nausea, vomiting, palpitations, presyncope, syncope, or diaphoresis. She has never noted anything like this before. She underwent stress echo based on her symptoms on 9/23 that showed 1 mm horizontal depression in inferior leads and V5-V6  lasting 5 minutes into recovery. Duke scoring: exercise time of 8 minutes; maximum ST deviation of 1 mm; no angina resulting of a score of 3, leading to a moderate risk of cardiac events. Staged echo showed findings were positive for stress-induced ischemia. At peak stress there was probable hypokinesis of the mid inferoseptal and mid inferior myocardium.   Since this stress test she reports she has continued to live her active lifestyle. She is still SOB with going up the stairs. She occasionally gets chest pain while at church. She states this pain feels like "there is something in there." She is currently chest pain free. Weight has been stable. She has long standing LEE. No cough, abdominal fullness, orthopnea, early satiety, PND, palpitations, nausea, vomiting, SOB, or presyncope.      Past Medical History  Diagnosis Date  . Diabetes mellitus without complication   . Hyperlipidemia   . Hypertension   :  Past Surgical History  Procedure Laterality Date  . Abdominal hysterectomy    . Hernia repair    . Appendectomy  1970  . Abdominal hysterectomy  1970  . Tonsillectomy  1955  :  Social History:  The patient  reports that she has never smoked. She has never used smokeless tobacco. She reports that she does not drink alcohol or use illicit drugs.   Family History  Problem Relation Age of Onset  . Hypertension Mother      Allergies:  No Known Allergies   Home Medications:  Current Outpatient Prescriptions  Medication Sig Dispense Refill  . ALLOPURINOL PO Take by mouth daily.    Marland Kitchen  GARLIC PO Take by mouth.    . losartan (COZAAR) 50 MG tablet Take 1 tablet (50 mg total) by mouth daily. 90 tablet 3  . Multiple Vitamins-Minerals (MULTIVITAMIN ADULT PO) Take by mouth.    Marland Kitchen omeprazole (PRILOSEC) 20 MG capsule Take 1 capsule (20 mg total) by mouth 2 (two) times daily before a meal. 60 capsule 12  . pravastatin (PRAVACHOL) 40 MG tablet Take 1 tablet (40 mg total) by mouth daily.  (Patient taking differently: Take 20 mg by mouth daily. ) 90 tablet 3  . simethicone (MYLICON) 248 MG chewable tablet Chew 1 tablet (125 mg total) by mouth every 6 (six) hours as needed for flatulence. 30 tablet 0   No current facility-administered medications for this visit.     Review of Systems:  Review of Systems  Constitutional: Positive for malaise/fatigue. Negative for fever, chills, weight loss and diaphoresis.  HENT: Negative for congestion.   Eyes: Negative for discharge and redness.  Respiratory: Negative for cough, hemoptysis, sputum production, shortness of breath and wheezing.   Cardiovascular: Positive for chest pain and leg swelling. Negative for palpitations, orthopnea, claudication and PND.  Gastrointestinal: Positive for heartburn. Negative for nausea, vomiting, abdominal pain, blood in stool and melena.  Genitourinary: Negative for hematuria.  Musculoskeletal: Negative for myalgias and falls.  Skin: Negative for rash.  Neurological: Negative for dizziness, tingling, tremors, sensory change, speech change, focal weakness, seizures, loss of consciousness and weakness.  Endo/Heme/Allergies: Does not bruise/bleed easily.  Psychiatric/Behavioral: Negative for depression and substance abuse. The patient is not nervous/anxious.      Physical Exam:  Blood pressure 114/68, pulse 62, height 5\' 7"  (1.702 m), weight 174 lb 12 oz (79.266 kg). BMI: Body mass index is 27.36 kg/(m^2). General: Pleasant, NAD. Psych: Normal affect. Responds to questions with normal affect.  Neuro: Alert and oriented X 3. Moves all extremities spontaneously. HEENT: Normocephalic, atraumatic. EOM intact. Sclera anicteric.  Neck: Trachea midline. Supple without bruits or JVD. Lungs:  Respirations regular and unlabored. CTA bilaterally without wheezing, crackles, or rhonchi.  Heart: RRR, normal s3, s4. No murmurs, rubs, or gallops.  Abdomen: Soft, non-tender, non-distended, BS + x 4.  Extremities:  No clubbing, cyanosis or edema. DP/PT/Radials 2+ and equal bilaterally.   Accessory Clinical Findings:  EKG: NSR with sinus arrhythmia and rare PVC, 62 bpm, low voltage QRS, nonspecific inferolateral st/t abnormality    Recent Labs: 07/18/2015: BUN 8; Creatinine, Ser 0.67; Potassium 4.6; Sodium 143  No results found for requested labs within last 365 days.  CrCl cannot be calculated (Patient has no serum creatinine result on file.).  Weights: Wt Readings from Last 3 Encounters:  08/17/15 174 lb 12 oz (79.266 kg)  08/02/15 175 lb 12 oz (79.72 kg)  07/10/15 176 lb 6.4 oz (80.015 kg)    Other studies Reviewed: Additional studies/ records that were reviewed today include: prior office note.  Assessment & Plan:  1. Class III angina/abnormal stress echo/dyspnea on exertion: -Schedule cardiac cath with Dr. [name], MD for 08/23/2015 -Hold losartan pre-cath -If cath shows nonobstructive CAD consider GI  -Risks and benefits of cardiac catheterization have been discussed with the patient including risks of bleeding, bruising, infection, kidney damage, stroke, heart attack, and death. The patient understands these risks and is willing to proceed with the procedure. All questions have been answered and concerns listened to.   2. HTN: -Controlled -Continue losartan, except for holding pre-cath as above  3. HLD: -Continue pravastatin 40 mg qhs -If needs PCI, would  consider changing to Lipitor post checking lipid panel in the hospital   4. DM2: -Controlled -Not on any medications, including metformin    Dispo: -Follow up post cardiac cath  Current medicines are reviewed at length with the patient today.  The patient did not have any concerns regarding medicines.   Christell Faith, PA-C [place], [place] [address] [phone] [facility] 08/17/2015, 2:49 PM

## 2015-08-24 ENCOUNTER — Telehealth: Payer: Self-pay | Admitting: *Deleted

## 2015-08-24 NOTE — Telephone Encounter (Signed)
She has been having Card. W/u and had card. Cath on 08/23/15.  Will worry about f/u here after card w/u finished.-jh

## 2015-08-24 NOTE — Telephone Encounter (Signed)
FYI: patient was scheduled for an appt on 08/22/15.  Patient cancelled appt and did not reschedule.

## 2015-09-18 ENCOUNTER — Other Ambulatory Visit: Payer: Self-pay | Admitting: Family Medicine

## 2015-09-18 MED ORDER — TRUETRACK SMART SYSTEM KIT: 1.0000 | PACK | Freq: Once | Status: DC

## 2015-09-18 MED ORDER — GLUCOSE BLOOD VI STRP: ORAL_STRIP | Status: DC

## 2015-10-04 ENCOUNTER — Encounter: Payer: Self-pay | Admitting: Family Medicine

## 2015-10-04 ENCOUNTER — Ambulatory Visit (INDEPENDENT_AMBULATORY_CARE_PROVIDER_SITE_OTHER): Payer: Medicare Other | Admitting: Family Medicine

## 2015-10-04 ENCOUNTER — Ambulatory Visit: Payer: Medicare Other | Admitting: Cardiology

## 2015-10-04 VITALS — BP 138/77 | HR 67 | Resp 16 | Ht 67.0 in | Wt 174.6 lb

## 2015-10-04 DIAGNOSIS — R0789 Other chest pain: Secondary | ICD-10-CM | POA: Insufficient documentation

## 2015-10-04 DIAGNOSIS — K219 Gastro-esophageal reflux disease without esophagitis: Secondary | ICD-10-CM | POA: Insufficient documentation

## 2015-10-04 DIAGNOSIS — M1710 Unilateral primary osteoarthritis, unspecified knee: Secondary | ICD-10-CM | POA: Insufficient documentation

## 2015-10-04 DIAGNOSIS — Z23 Encounter for immunization: Secondary | ICD-10-CM

## 2015-10-04 DIAGNOSIS — I10 Essential (primary) hypertension: Secondary | ICD-10-CM

## 2015-10-04 DIAGNOSIS — E119 Type 2 diabetes mellitus without complications: Secondary | ICD-10-CM | POA: Diagnosis not present

## 2015-10-04 DIAGNOSIS — L03119 Cellulitis of unspecified part of limb: Secondary | ICD-10-CM | POA: Insufficient documentation

## 2015-10-04 DIAGNOSIS — E785 Hyperlipidemia, unspecified: Secondary | ICD-10-CM

## 2015-10-04 DIAGNOSIS — R059 Cough, unspecified: Secondary | ICD-10-CM | POA: Insufficient documentation

## 2015-10-04 DIAGNOSIS — R05 Cough: Secondary | ICD-10-CM | POA: Insufficient documentation

## 2015-10-04 LAB — POCT GLYCOSYLATED HEMOGLOBIN (HGB A1C): Hemoglobin A1C: 6.6

## 2015-10-04 NOTE — Patient Instructions (Signed)
Continue to try to eat a healthy diet and lose some weight.

## 2015-10-04 NOTE — Progress Notes (Signed)
Name: Sherri Leon   MRN: [id number]    DOB: 09-11-1936   Date:10/04/2015       Progress Note  Subjective  Chief Complaint  Chief Complaint  Patient presents with  . Diabetes    6.1 06/19/2015 A1C   Sugar 90-130     HPI Here for f/u of HBP.  Has had card work-up and found to have no major blockages.  Has card f/u this afternoon.  No problem-specific assessment & plan notes found for this encounter.   Past Medical History  Diagnosis Date  . Diabetes mellitus without complication ([place])   . Hyperlipidemia   . Hypertension     Social History  Substance Use Topics  . Smoking status: Never Smoker   . Smokeless tobacco: Never Used  . Alcohol Use: No     Current outpatient prescriptions:  .  ALLOPURINOL PO, Take by mouth daily., Disp: , Rfl:  .  aspirin EC 81 MG tablet, Take 81 mg by mouth daily., Disp: , Rfl:  .  Blood Glucose Monitoring Suppl ([place]) KIT, 1 each by Does not apply route once. Dx E11.9, Disp: 1 each, Rfl: 0 .  GARLIC PO, Take by mouth., Disp: , Rfl:  .  glucose blood (TRUETEST TEST) test strip, Use as instructed  Dx E11.9, Disp: 100 each, Rfl: 12 .  losartan (COZAAR) 50 MG tablet, Take 1 tablet (50 mg total) by mouth daily., Disp: 90 tablet, Rfl: 3 .  Multiple Vitamins-Minerals (MULTIVITAMIN ADULT PO), Take by mouth., Disp: , Rfl:  .  pravastatin (PRAVACHOL) 40 MG tablet, Take 1 tablet (40 mg total) by mouth daily. (Patient taking differently: Take 20 mg by mouth daily. ), Disp: 90 tablet, Rfl: 3  No Known Allergies  Review of Systems  Constitutional: Negative for fever, chills, weight loss and malaise/fatigue.  HENT: Negative for hearing loss.   Eyes: Negative for blurred vision and double vision.  Respiratory: Positive for cough (mild, clearing). Negative for sputum production, shortness of breath and wheezing.   Cardiovascular: Negative for chest pain, palpitations, orthopnea and leg swelling.  Gastrointestinal: Negative for heartburn,  abdominal pain and blood in stool.  Genitourinary: Negative for dysuria, urgency and frequency.  Musculoskeletal: Negative for myalgias and joint pain.  Skin: Negative for rash.  Neurological: Negative for dizziness, tremors, weakness and headaches.      Objective  Filed Vitals:   10/04/15 1357  BP: 138/77  Pulse: 67  Resp: 16  Height: _0  (1.702 m)  Weight: 174 lb 9.6 oz (79.198 kg)     Physical Exam  Constitutional: She is oriented to person, place, and time and well-developed, well-nourished, and in no distress. No distress.  HENT:  Head: Normocephalic and atraumatic.  Eyes: Conjunctivae are normal. Pupils are equal, round, and reactive to light. No scleral icterus.  Neck: Normal range of motion. Neck supple. Carotid bruit is not present. No thyromegaly present.  Cardiovascular: Normal rate, regular rhythm, normal heart sounds and intact distal pulses.  Exam reveals no gallop and no friction rub.   No murmur heard. Pulmonary/Chest: Effort normal and breath sounds normal. No respiratory distress. She exhibits no tenderness.  Abdominal: Soft. Bowel sounds are normal. She exhibits no distension, no abdominal bruit and no mass. There is no tenderness.  Musculoskeletal: She exhibits no edema.  Lymphadenopathy:    She has no cervical adenopathy.  Neurological: She is alert and oriented to person, place, and time.  Vitals reviewed.     Recent Results (  from the past 2160 hour(s))  Basic Metabolic Panel (BMET)     Status: Abnormal   Collection Time: 07/18/15 11:09 AM  Result Value Ref Range   Glucose 109 (H) 65 - 99 mg/dL   BUN 8 8 - 27 mg/dL   Creatinine, Ser 0.67 0.57 - 1.00 mg/dL   GFR calc non Af Amer 85 >59 mL/min/1.73   GFR calc Af Amer 98 >59 mL/min/1.73   BUN/Creatinine Ratio 12 11 - 26   Sodium 143 134 - 144 mmol/L   Potassium 4.6 3.5 - 5.2 mmol/L   Chloride 104 97 - 108 mmol/L   CO2 27 18 - 29 mmol/L   Calcium 9.3 8.7 - 10.3 mg/dL  CBC with Differential      Status: None   Collection Time: 07/18/15 11:09 AM  Result Value Ref Range   WBC 4.6 3.4 - 10.8 x10E3/uL   RBC 4.48 3.77 - 5.28 x10E6/uL   Hemoglobin 12.2 11.1 - 15.9 g/dL   Hematocrit 36.5 34.0 - 46.6 %   MCV 82 79 - 97 fL   MCH 27.2 26.6 - 33.0 pg   MCHC 33.4 31.5 - 35.7 g/dL   RDW 14.8 12.3 - 15.4 %   Platelets 171 150 - 379 x10E3/uL   Neutrophils 46 %   Lymphs 43 %   Monocytes 8 %   Eos 3 %   Basos 0 %   Neutrophils Absolute 2.1 1.4 - 7.0 x10E3/uL   Lymphocytes Absolute 2.0 0.7 - 3.1 x10E3/uL   Monocytes Absolute 0.4 0.1 - 0.9 x10E3/uL   EOS (ABSOLUTE) 0.1 0.0 - 0.4 x10E3/uL   Basophils Absolute 0.0 0.0 - 0.2 x10E3/uL   Immature Granulocytes 0 %   Immature Grans (Abs) 0.0 0.0 - 0.1 x10E3/uL  ECHO STRESS TEST     Status: None   Collection Time: 08/11/15  4:30 PM  Result Value Ref Range   LVIDD  3.5 - 6.0 cm   LVIDS  2.1 - 4.0 cm   IVS  0.6 - 1.1 cm   PW  0.6 - 1.1 mm   FS  28 - 44 %   EF  %   LV 3D EF%  %   LV LONGITUDINAL STRAIN     3D LV EDV  ml   LV RADIAL STRAIN     3D LV ESV  ml   LV STRAIN EF     LV Systolic Volume  mL   LV Systolic Volume Index  mL/m2   LV Diastolic Volume  mL   LV Diastolic Volume Index  mL/m2   LV mass  g   LV Mass Index  g/m2   LV SYMPSON'S DISK     POSTERIOR WALL SYSTOLE  mm   IV/PVOW     E/A ratio     E wave decelartion time  msec   IVRT  msec   MV "A" wave duration  msec   Pulm vein S/D ratio     Pulm vein "A" wave  msec   TDI     MV Peak E Vel  m/s   LV TDI E'MEDIAL     MV Peak A Vel  m/s   LV TDI E'LATERAL     E/E' ratio     LVOT stroke volume  cm3   LVOT diameter  cm   Qp:Qs ratio     LVOT GR Mean  mm   AV LVOT peak gradient  mmHg   AV LVOT peak gradient w/amyl nitrate  mmHg   Grad vals  mmHg   AV LVOT peak gradient post stress  mmHg   Rest HR 73 bpm   Rest BP 148/83 mmHg   Exercise duration (min) 8 min   Exercise duration (sec) 46 sec   Estimated workload 10.1 METS   Peak HR 139 bpm   Peak BP 151/85 mmHg    MPHR 143 bpm   Percent HR 97 %   RPE     LA size  cm   LA volume  cm3   LEFT ATRIUM END SYS DIAM  cm   LA Volume Index  mL/m2   PV Peak S Vel  m/s   PV Peak D Vel  m/s   LVOT area  cm2   LVOT peak vel  m/s   LVOT peak VTI  cm   AO mean calculated velocity dopler  cm/s   Ao peak vel  m/s   Ao VTI  cm   AV valve area  cm2   AV mean gradient  mmHg   AV peak gradient  mmHg   AV Velocity Ratio     Echo EF Estimated  %   CHL CUP AV VALUE AREA INDEX     CHL CUP AV PEAK INDEX     AV mean gradient post stress  mmHg   AV peak gradient post stress  mmHg   AV peak velocity post stress  m/s   AV valve area P1/2 post stress  cm2   AORTIC VALVE SV  cm3   AV regurgitant volume  cc   AV regurgitant fraction  %   AV regurgitation pressure 1/2 time  ms   AV vena contracta  cm   CHL CUP AV VEL     CHL CUP AV DEC     CHL CUP AV REG GRAD     PISA AR VN Nyquist  m/s   AV Radius PISA  cm   AR Max Vel  m/s   AV area PISA  cm2   Ao root annulus  cm   Sinus  cm   STJ  cm   Proximal aorta  cm   Ascending aorta  cm   Aortic arch  cm   AORTA DESCENDING  cm   MV mean gradient  mmHg   MV peak gradient  mmHg   MV VTI  cm   MV stenosis pressure 1/2 time  ms   MV valve area p 1/2 method  cm2   MV valve area by planimetry  cm2   MV valve area by continuity eq  cm2   MV valve area  cm2   TV rest pulmonary artery pressure  mmHg   CHL CUP MV M VEL     Vn Nyquist MS  m/s   PISA MS Angle Cor  deg   MV mean gradient post stress  mmHg   MV valve area P1/2 post stress  cm2   PAP post stress  mmHg   CHL CUP LVOT MV VTI     Mitral Valve Annulus Velocity Time Integral  cm   MITRAL VALVE SV  cm3   MV regurgitant volume  cc   MV regurgitation fraction  %   MV vena contracta  cm   MITRAL REGURG PEAK VELOCITY  cm/sec   MITRAL VALVE ANNULUS DIAMETER  cm   MITRAL VALVE MR JET LA AREA  cm   CHL CUP MV DEC (S)     Vn Nyquist  m/s  Radius  cm   Mr max vel  m/s   MR PISA EROA  cm2   RV LENGTH  ANNULUS TO APEX  cm   RV RVID MID VENT  cm   RV RVID APEX  cm   RV RVID ANNULUS  cm   RV LATERAL S' VELOCITY  cm/sec   RV FREE WALL ANNULUS  cm/sec   RV TAPSE  cm   RV WALL THICKNESS  cm  Basic Metabolic Panel (BMET)     Status: Abnormal   Collection Time: 08/17/15  3:41 PM  Result Value Ref Range   Glucose 117 (H) 65 - 99 mg/dL   BUN 12 8 - 27 mg/dL   Creatinine, Ser 0.86 0.57 - 1.00 mg/dL   GFR calc non Af Amer 65 >59 mL/min/1.73   GFR calc Af Amer 75 >59 mL/min/1.73   BUN/Creatinine Ratio 14 11 - 26   Sodium 143 134 - 144 mmol/L   Potassium 4.8 3.5 - 5.2 mmol/L   Chloride 104 97 - 108 mmol/L   CO2 26 18 - 29 mmol/L   Calcium 9.6 8.7 - 10.3 mg/dL  CBC with Differential/Platelet     Status: None   Collection Time: 08/17/15  3:41 PM  Result Value Ref Range   WBC 5.6 3.4 - 10.8 x10E3/uL   RBC 4.55 3.77 - 5.28 x10E6/uL   Hemoglobin 12.2 11.1 - 15.9 g/dL   Hematocrit 37.3 34.0 - 46.6 %   MCV 82 79 - 97 fL   MCH 26.8 26.6 - 33.0 pg   MCHC 32.7 31.5 - 35.7 g/dL   RDW 15.0 12.3 - 15.4 %   Platelets 189 150 - 379 x10E3/uL   Neutrophils 48 %   Lymphs 42 %   Monocytes 7 %   Eos 3 %   Basos 0 %   Neutrophils Absolute 2.7 1.4 - 7.0 x10E3/uL   Lymphocytes Absolute 2.4 0.7 - 3.1 x10E3/uL   Monocytes Absolute 0.4 0.1 - 0.9 x10E3/uL   EOS (ABSOLUTE) 0.1 0.0 - 0.4 x10E3/uL   Basophils Absolute 0.0 0.0 - 0.2 x10E3/uL   Immature Granulocytes 0 %   Immature Grans (Abs) 0.0 0.0 - 0.1 x10E3/uL  INR/PT     Status: None   Collection Time: 08/17/15  3:41 PM  Result Value Ref Range   INR 1.0 0.8 - 1.2    Comment: Reference interval is for non-anticoagulated patients. Suggested INR therapeutic range for Vitamin K antagonist therapy:    Standard Dose (moderate intensity                   therapeutic range):       2.0 - 3.0    Higher intensity therapeutic range       2.5 - 3.5    Prothrombin Time 10.4 9.1 - 12.0 sec     Assessment & Plan  1. Type 2 diabetes mellitus without  complication, without long-term current use of insulin ([facility]) -cont. Off Metformin - POCT HgB A1C-6.6  2. Essential hypertension  Cont. Losartan 3. Hyperlipidemia Cont. Pravastatin 4. Need for influenza vaccination  - Flu vaccine HIGH DOSE PF (Fluzone High dose)

## 2015-10-09 DIAGNOSIS — Z1231 Encounter for screening mammogram for malignant neoplasm of breast: Secondary | ICD-10-CM | POA: Diagnosis not present

## 2015-10-09 LAB — HM MAMMOGRAPHY: HM Mammogram: NEGATIVE

## 2015-10-16 ENCOUNTER — Encounter: Payer: Self-pay | Admitting: Family Medicine

## 2015-12-14 DIAGNOSIS — E113293 Type 2 diabetes mellitus with mild nonproliferative diabetic retinopathy without macular edema, bilateral: Secondary | ICD-10-CM | POA: Diagnosis not present

## 2015-12-14 DIAGNOSIS — E113393 Type 2 diabetes mellitus with moderate nonproliferative diabetic retinopathy without macular edema, bilateral: Secondary | ICD-10-CM | POA: Diagnosis not present

## 2015-12-28 LAB — HM DIABETES FOOT EXAM

## 2015-12-29 LAB — HEMOGLOBIN A1C: HEMOGLOBIN A1C: 6

## 2016-01-09 DIAGNOSIS — H26491 Other secondary cataract, right eye: Secondary | ICD-10-CM | POA: Diagnosis not present

## 2016-01-09 DIAGNOSIS — Z961 Presence of intraocular lens: Secondary | ICD-10-CM | POA: Diagnosis not present

## 2016-01-09 DIAGNOSIS — H26492 Other secondary cataract, left eye: Secondary | ICD-10-CM | POA: Diagnosis not present

## 2016-02-08 ENCOUNTER — Ambulatory Visit: Payer: Self-pay | Admitting: Family Medicine

## 2016-02-22 ENCOUNTER — Telehealth: Payer: Self-pay | Admitting: Cardiology

## 2016-02-22 NOTE — Telephone Encounter (Signed)
3 attempts to schedule from recall list.  Patient spouse and brother are sick and she is unable to keep or schedule any appts at this time. Will call us when ready to be seen.

## 2016-03-04 ENCOUNTER — Ambulatory Visit
Admission: RE | Admit: 2016-03-04 | Discharge: 2016-03-04 | Disposition: A | Discharge status: Home / Self Care | Payer: Medicare Other | Source: Ambulatory Visit | Attending: Family Medicine | Admitting: Family Medicine

## 2016-03-04 ENCOUNTER — Encounter: Payer: Self-pay | Admitting: Family Medicine

## 2016-03-04 ENCOUNTER — Ambulatory Visit (INDEPENDENT_AMBULATORY_CARE_PROVIDER_SITE_OTHER): Payer: Medicare Other | Admitting: Family Medicine

## 2016-03-04 VITALS — BP 133/73 | HR 59 | Resp 16 | Ht 63.0 in | Wt 178.0 lb

## 2016-03-04 DIAGNOSIS — M25562 Pain in left knee: Principal | ICD-10-CM

## 2016-03-04 DIAGNOSIS — M1712 Unilateral primary osteoarthritis, left knee: Secondary | ICD-10-CM | POA: Insufficient documentation

## 2016-03-04 DIAGNOSIS — M179 Osteoarthritis of knee, unspecified: Secondary | ICD-10-CM | POA: Diagnosis not present

## 2016-03-04 DIAGNOSIS — E119 Type 2 diabetes mellitus without complications: Secondary | ICD-10-CM

## 2016-03-04 DIAGNOSIS — I10 Essential (primary) hypertension: Secondary | ICD-10-CM | POA: Diagnosis not present

## 2016-03-04 DIAGNOSIS — G8929 Other chronic pain: Secondary | ICD-10-CM | POA: Diagnosis not present

## 2016-03-04 DIAGNOSIS — M25462 Effusion, left knee: Secondary | ICD-10-CM | POA: Insufficient documentation

## 2016-03-04 DIAGNOSIS — E785 Hyperlipidemia, unspecified: Secondary | ICD-10-CM

## 2016-03-04 MED ORDER — MELOXICAM 15 MG PO TABS: 15.0000 mg | ORAL_TABLET | Freq: Every day | ORAL | Status: DC

## 2016-03-04 NOTE — Progress Notes (Signed)
Name: Sherri Leon   MRN: [id number]    DOB: Mar 13, 1936   Date:03/04/2016       Progress Note  Subjective  Chief Complaint  Chief Complaint  Patient presents with  . Knee Pain    chronic issue no injury but has gotten worse over last few days.     HPI Here c/o L knee pain for past month.  Getting worse past week with more swelling and leg pain  No trauma.  Most pain is Lat L knee.    No problem-specific assessment & plan notes found for this encounter.   Past Medical History  Diagnosis Date  . Diabetes mellitus without complication ([place])   . Hyperlipidemia   . Hypertension     Past Surgical History  Procedure Laterality Date  . Abdominal hysterectomy    . Hernia repair    . Appendectomy  1970  . Abdominal hysterectomy  1970  . Tonsillectomy  1955  . Cardiac catheterization N/A 08/23/2015    Procedure: Left Heart Cath;  Surgeon: Leonie Man, MD;  Location: [facility];  Service: Cardiovascular;  Laterality: N/A;    Family History  Problem Relation Age of Onset  . Hypertension Mother     Social History   Social History  . Marital Status: Married    Spouse Name: N/A  . Number of Children: N/A  . Years of Education: N/A   Occupational History  . Not on file.   Social History Main Topics  . Smoking status: Never Smoker   . Smokeless tobacco: Never Used  . Alcohol Use: No  . Drug Use: No  . Sexual Activity: Not Currently   Other Topics Concern  . Not on file   Social History Narrative   ** Merged History Encounter **         Current outpatient prescriptions:  .  aspirin EC 81 MG tablet, Take 81 mg by mouth daily., Disp: , Rfl:  .  Blood Glucose Monitoring Suppl ([place]) KIT, 1 each by Does not apply route once. Dx E11.9, Disp: 1 each, Rfl: 0 .  GARLIC PO, Take by mouth., Disp: , Rfl:  .  glucose blood (TRUETEST TEST) test strip, Use as instructed  Dx E11.9, Disp: 100 each, Rfl: 12 .  losartan (COZAAR) 50 MG tablet, Take 1  tablet (50 mg total) by mouth daily., Disp: 90 tablet, Rfl: 3 .  Multiple Vitamins-Minerals (MULTIVITAMIN ADULT PO), Take by mouth., Disp: , Rfl:  .  pravastatin (PRAVACHOL) 40 MG tablet, Take 1 tablet (40 mg total) by mouth daily. (Patient taking differently: Take 20 mg by mouth daily. ), Disp: 90 tablet, Rfl: 3 .  ALLOPURINOL PO, Take by mouth daily. Reported on 03/04/2016, Disp: , Rfl:  .  meloxicam (MOBIC) 15 MG tablet, Take 1 tablet (15 mg total) by mouth daily., Disp: 30 tablet, Rfl: 3  Not on File   Review of Systems  Constitutional: Negative for fever, chills, weight loss and malaise/fatigue.  HENT: Negative for hearing loss.   Eyes: Negative for blurred vision and double vision.  Respiratory: Negative for cough, shortness of breath and wheezing.   Cardiovascular: Positive for leg swelling (L knee swelling). Negative for chest pain and palpitations.  Gastrointestinal: Negative for heartburn, abdominal pain and blood in stool.  Genitourinary: Negative for dysuria, urgency and frequency.  Musculoskeletal: Positive for joint pain (L knee.).  Skin: Negative for rash.  Neurological: Negative for dizziness, tremors, weakness and headaches.  Objective  Filed Vitals:   03/04/16 1329  BP: 133/73  Pulse: 59  Resp: 16  Height: _0  (1.6 m)  Weight: 178 lb (80.74 kg)    Physical Exam  Constitutional: She is oriented to person, place, and time and well-developed, well-nourished, and in no distress. No distress.  HENT:  Head: Normocephalic and atraumatic.  Neck: Carotid bruit is not present.  Cardiovascular: Normal rate, regular rhythm and normal heart sounds.   Pulmonary/Chest: No respiratory distress. She has no wheezes. She has no rales.  Musculoskeletal: She exhibits no edema.  L knee swollen and tender ,esp. Lateral joint line.  Neurological: She is alert and oriented to person, place, and time.  Vitals reviewed.      No results found for this or any previous  visit (from the past 2160 hour(s)).   Assessment & Plan  Problem List Items Addressed This Visit      Cardiovascular and Mediastinum   Essential hypertension (Chronic)     Endocrine   Diabetes mellitus type II, controlled; last hemoglobin A1c 6.6 (Chronic)     Other   Hyperlipidemia    Other Visit Diagnoses    Knee pain, chronic, left    -  Primary    Relevant Medications    meloxicam (MOBIC) 15 MG tablet    Other Relevant Orders    DG Knee Complete 4 Views Left       Meds ordered this encounter  Medications  . meloxicam (MOBIC) 15 MG tablet    Sig: Take 1 tablet (15 mg total) by mouth daily.    Dispense:  30 tablet    Refill:  3   1. Knee pain, chronic, left  - DG Knee Complete 4 Views Left; Future - meloxicam (MOBIC) 15 MG tablet; Take 1 tablet (15 mg total) by mouth daily.  Dispense: 30 tablet; Refill: 3  2. Essential hypertension   3. Hyperlipidemia   4. Controlled type 2 diabetes mellitus without complication, without long-term current use of insulin ([place])

## 2016-03-04 NOTE — Patient Instructions (Signed)
Plan A1c on return

## 2016-03-18 ENCOUNTER — Ambulatory Visit (INDEPENDENT_AMBULATORY_CARE_PROVIDER_SITE_OTHER): Payer: Medicare Other | Admitting: Family Medicine

## 2016-03-18 ENCOUNTER — Encounter: Payer: Self-pay | Admitting: Family Medicine

## 2016-03-18 VITALS — BP 140/74 | HR 61 | Temp 98.0°F | Resp 16 | Ht 63.0 in | Wt 179.0 lb

## 2016-03-18 DIAGNOSIS — M25562 Pain in left knee: Secondary | ICD-10-CM | POA: Diagnosis not present

## 2016-03-18 DIAGNOSIS — E119 Type 2 diabetes mellitus without complications: Secondary | ICD-10-CM

## 2016-03-18 LAB — POCT GLYCOSYLATED HEMOGLOBIN (HGB A1C): HEMOGLOBIN A1C: 6.9

## 2016-03-18 NOTE — Assessment & Plan Note (Signed)
A1c mildly elevated to 6.9%- plan to continue diet and lifestyle recommendations- add more exercise. Recheck 3 mos and add back metformin if same or elevated.

## 2016-03-18 NOTE — Progress Notes (Signed)
Subjective:    Patient ID: Sherri Leon, female    DOB: [birth year], 80 y.o.   MRN: [id number]  HPI: Sherri Leon is a 79 y.o. female presenting on 03/18/2016 for Diabetes and Knee Pain   HPI  Pt presents follow-up of knee pain. Doing well on Mobic for pain. Knee is much improved. Has not felt like she needed it daily. Some swelling in the knee. Pain is bearable at this point.  Was taken off her metformin 3 mos ago and is due for A1c check. A1c is up to 6.9%.  Eating some ice cream. Push mows her yard. No numbness or tingling in feet. Checking sugars- avg is 100 in the AM. Evening 150-180 about 2 hours after a meal.   Past Medical History  Diagnosis Date  . Diabetes mellitus without complication ([place])   . Hyperlipidemia   . Hypertension     Current Outpatient Prescriptions on File Prior to Visit  Medication Sig  . ALLOPURINOL PO Take by mouth daily. Reported on 03/04/2016  . aspirin EC 81 MG tablet Take 81 mg by mouth daily.  . Blood Glucose Monitoring Suppl ([place]) KIT 1 each by Does not apply route once. Dx E11.9  . GARLIC PO Take by mouth.  Marland Kitchen glucose blood (TRUETEST TEST) test strip Use as instructed  Dx E11.9  . losartan (COZAAR) 50 MG tablet Take 1 tablet (50 mg total) by mouth daily.  . meloxicam (MOBIC) 15 MG tablet Take 1 tablet (15 mg total) by mouth daily.  . Multiple Vitamins-Minerals (MULTIVITAMIN ADULT PO) Take by mouth.  . pravastatin (PRAVACHOL) 40 MG tablet Take 1 tablet (40 mg total) by mouth daily. (Patient taking differently: Take 20 mg by mouth daily. )   No current facility-administered medications on file prior to visit.    Review of Systems  Constitutional: Negative for fever and chills.  HENT: Negative.   Respiratory: Negative for cough, chest tightness and wheezing.   Cardiovascular: Negative for chest pain and leg swelling.  Gastrointestinal: Negative for nausea, vomiting, abdominal pain, diarrhea and constipation.  Endocrine:  Negative.  Negative for cold intolerance, heat intolerance, polydipsia, polyphagia and polyuria.  Genitourinary: Negative for dysuria and difficulty urinating.  Musculoskeletal: Positive for joint swelling and arthralgias.  Neurological: Negative for dizziness, light-headedness and numbness.  Psychiatric/Behavioral: Negative.    Per HPI unless specifically indicated above     Objective:    BP 140/74 mmHg  Pulse 61  Temp(Src) 98 F (36.7 C) (Oral)  Resp 16  Ht '5\' 3"'  (1.6 m)  Wt 179 lb (81.194 kg)  BMI 31.72 kg/m2  Wt Readings from Last 3 Encounters:  03/18/16 179 lb (81.194 kg)  03/04/16 178 lb (80.74 kg)  10/04/15 174 lb 9.6 oz (79.198 kg)    Physical Exam  Constitutional: She is oriented to person, place, and time. She appears well-developed and well-nourished.  HENT:  Head: Normocephalic and atraumatic.  Neck: Neck supple.  Cardiovascular: Normal rate, regular rhythm and normal heart sounds.  Exam reveals no gallop and no friction rub.   No murmur heard. Pulmonary/Chest: Effort normal and breath sounds normal. She has no wheezes. She exhibits no tenderness.  Abdominal: Soft. Normal appearance and bowel sounds are normal. She exhibits no distension and no mass. There is no tenderness. There is no rebound and no guarding.  Musculoskeletal: Normal range of motion. She exhibits no edema or tenderness.  Lymphadenopathy:    She has no cervical adenopathy.  Neurological: She  is alert and oriented to person, place, and time.  Skin: Skin is warm and dry.   Results for orders placed or performed in visit on 03/18/16  POCT HgB A1C  Result Value Ref Range   Hemoglobin A1C 6.9       Assessment & Plan:   Problem List Items Addressed This Visit      Endocrine   Diabetes mellitus, type 2 ([place]) - Primary    A1c mildly elevated to 6.9%- plan to continue diet and lifestyle recommendations- add more exercise. Recheck 3 mos and add back metformin if same or elevated.         Relevant Orders   POCT HgB A1C (Completed)    Other Visit Diagnoses    Left lateral knee pain        Much improved. Continue PRN mobic. Consider ortho referral for joint injection if symptoms worsen.        No orders of the defined types were placed in this encounter.      Follow up plan: Return in about 3 months (around 06/18/2016) for diabetes. Marland Kitchen

## 2016-03-18 NOTE — Patient Instructions (Signed)
We will continue the mobic for knee as needed. If the knee continues to bother you- please let us know and we will get you over to orthopedics for cortisone shots.  Diabetes: Continue diet and lifestyle changes for your diabetes. We will check an A1c in 3 mos.

## 2016-04-16 ENCOUNTER — Telehealth: Payer: Self-pay | Admitting: Family Medicine

## 2016-04-16 DIAGNOSIS — M25562 Pain in left knee: Secondary | ICD-10-CM

## 2016-04-16 NOTE — Telephone Encounter (Signed)
Pt. Called wanting a referral to Orthopaedics @[facility]  Dr. Melanee Left . Pt. Call back [phone]

## 2016-04-16 NOTE — Telephone Encounter (Signed)
Referral placed.[place]

## 2016-04-16 NOTE — Telephone Encounter (Signed)
Find out what she wants referral for so we can make specific referral and then go ahead and refer as requested.-jh

## 2016-04-26 DIAGNOSIS — H26492 Other secondary cataract, left eye: Secondary | ICD-10-CM | POA: Diagnosis not present

## 2016-04-26 DIAGNOSIS — Z961 Presence of intraocular lens: Secondary | ICD-10-CM | POA: Diagnosis not present

## 2016-04-26 DIAGNOSIS — H18411 Arcus senilis, right eye: Secondary | ICD-10-CM | POA: Diagnosis not present

## 2016-05-03 DIAGNOSIS — M1712 Unilateral primary osteoarthritis, left knee: Secondary | ICD-10-CM | POA: Diagnosis not present

## 2016-05-03 DIAGNOSIS — M25462 Effusion, left knee: Secondary | ICD-10-CM | POA: Diagnosis not present

## 2016-06-12 ENCOUNTER — Other Ambulatory Visit: Payer: Self-pay | Admitting: Family Medicine

## 2016-06-17 DIAGNOSIS — E113393 Type 2 diabetes mellitus with moderate nonproliferative diabetic retinopathy without macular edema, bilateral: Secondary | ICD-10-CM | POA: Diagnosis not present

## 2016-06-17 DIAGNOSIS — E083293 Diabetes mellitus due to underlying condition with mild nonproliferative diabetic retinopathy without macular edema, bilateral: Secondary | ICD-10-CM | POA: Diagnosis not present

## 2016-06-17 DIAGNOSIS — H40153 Residual stage of open-angle glaucoma, bilateral: Secondary | ICD-10-CM | POA: Diagnosis not present

## 2016-06-21 ENCOUNTER — Encounter: Payer: Self-pay | Admitting: Family Medicine

## 2016-06-24 DIAGNOSIS — M1712 Unilateral primary osteoarthritis, left knee: Secondary | ICD-10-CM | POA: Diagnosis not present

## 2016-06-27 ENCOUNTER — Encounter: Payer: Self-pay | Admitting: Family Medicine

## 2016-06-27 ENCOUNTER — Ambulatory Visit (INDEPENDENT_AMBULATORY_CARE_PROVIDER_SITE_OTHER): Payer: Medicare Other | Admitting: Family Medicine

## 2016-06-27 VITALS — BP 145/75 | HR 79 | Temp 98.1°F | Resp 16 | Ht 63.0 in | Wt 173.0 lb

## 2016-06-27 DIAGNOSIS — I10 Essential (primary) hypertension: Secondary | ICD-10-CM

## 2016-06-27 DIAGNOSIS — M129 Arthropathy, unspecified: Secondary | ICD-10-CM | POA: Diagnosis not present

## 2016-06-27 DIAGNOSIS — E119 Type 2 diabetes mellitus without complications: Secondary | ICD-10-CM

## 2016-06-27 DIAGNOSIS — M1710 Unilateral primary osteoarthritis, unspecified knee: Secondary | ICD-10-CM

## 2016-06-27 LAB — POCT GLYCOSYLATED HEMOGLOBIN (HGB A1C)

## 2016-06-27 MED ORDER — LOSARTAN POTASSIUM 100 MG PO TABS: 100.0000 mg | ORAL_TABLET | Freq: Every day | ORAL | 3 refills | Status: DC

## 2016-06-27 MED ORDER — METFORMIN HCL 500 MG PO TABS: ORAL_TABLET | ORAL | 3 refills | Status: DC

## 2016-06-27 NOTE — Progress Notes (Signed)
Name: Sherri Leon   MRN: [id number]    DOB: [birth year]   Date:06/27/2016       Progress Note  Subjective  Chief Complaint  Chief Complaint  Patient presents with  . Diabetes    A1C 6.9% 03/2016    HPI Here for f/u of DM and HBP.  He is taking all meds.  She feels well overall.  No problem-specific Assessment & Plan notes found for this encounter.   Past Medical History:  Diagnosis Date  . Diabetes mellitus without complication ([place])   . Hyperlipidemia   . Hypertension     Past Surgical History:  Procedure Laterality Date  . ABDOMINAL HYSTERECTOMY    . ABDOMINAL HYSTERECTOMY  1970  . APPENDECTOMY  1970  . CARDIAC CATHETERIZATION N/A 08/23/2015   Procedure: Left Heart Cath;  Surgeon: Leonie Man, MD;  Location: [facility];  Service: Cardiovascular;  Laterality: N/A;  . HERNIA REPAIR    . TONSILLECTOMY  1955    Family History  Problem Relation Age of Onset  . Hypertension Mother     Social History   Social History  . Marital status: Married    Spouse name: N/A  . Number of children: N/A  . Years of education: N/A   Occupational History  . Not on file.   Social History Main Topics  . Smoking status: Never Smoker  . Smokeless tobacco: Never Used  . Alcohol use No  . Drug use: No  . Sexual activity: Not Currently   Other Topics Concern  . Not on file   Social History Narrative   ** Merged History Encounter **         Current Outpatient Prescriptions:  .  ALLOPURINOL PO, Take by mouth daily. Reported on 03/04/2016, Disp: , Rfl:  .  aspirin EC 81 MG tablet, Take 81 mg by mouth daily., Disp: , Rfl:  .  Blood Glucose Monitoring Suppl ([place]) KIT, 1 each by Does not apply route once. Dx E11.9, Disp: 1 each, Rfl: 0 .  GARLIC PO, Take by mouth., Disp: , Rfl:  .  glucose blood (TRUETEST TEST) test strip, Use as instructed  Dx E11.9, Disp: 100 each, Rfl: 12 .  losartan (COZAAR) 100 MG tablet, Take 1 tablet (100 mg total) by  mouth daily., Disp: 90 tablet, Rfl: 3 .  Multiple Vitamins-Minerals (MULTIVITAMIN ADULT PO), Take by mouth., Disp: , Rfl:  .  pravastatin (PRAVACHOL) 40 MG tablet, Take 1 tablet (40 mg total) by mouth daily., Disp: 90 tablet, Rfl: 3 .  metFORMIN (GLUCOPHAGE) 500 MG tablet, Take 1/2 tablet each AM before breakfast, Disp: 90 tablet, Rfl: 3 .  omeprazole (PRILOSEC) 20 MG capsule, Take 20 mg by mouth daily., Disp: , Rfl:   Not on File   Review of Systems  Constitutional: Negative for chills, fever, malaise/fatigue and weight loss.  HENT: Negative for hearing loss.   Eyes: Negative for blurred vision and double vision.  Respiratory: Negative for cough, shortness of breath and wheezing.   Cardiovascular: Negative for chest pain, palpitations and leg swelling.  Gastrointestinal: Negative for abdominal pain, blood in stool and heartburn.  Genitourinary: Negative for dysuria, frequency and urgency.  Skin: Negative for rash.  Neurological: Negative for dizziness, tremors, weakness and headaches.  Psychiatric/Behavioral: Negative for depression. The patient is not nervous/anxious and does not have insomnia.       Objective  Vitals:   06/27/16 1312 06/27/16 1407  BP: 135/87 (!) 145/75  Pulse: 79   Resp: 16   Temp: 98.1 F (36.7 C)   TempSrc: Oral   Weight: 173 lb (78.5 kg)   Height: '5\' 3"'  (1.6 m)     Physical Exam  Constitutional: She is oriented to person, place, and time and well-developed, well-nourished, and in no distress. No distress.  HENT:  Head: Normocephalic and atraumatic.  Eyes: Conjunctivae are normal. Pupils are equal, round, and reactive to light. No scleral icterus.  Neck: Normal range of motion. Neck supple. Carotid bruit is not present. No thyromegaly present.  Cardiovascular: Normal rate, regular rhythm and normal heart sounds.   Occasional extrasystoles are present. Exam reveals no gallop and no friction rub.   No murmur heard. Pulmonary/Chest: Effort normal and  breath sounds normal. No respiratory distress. She has no wheezes. She has no rales.  Abdominal: Soft. Bowel sounds are normal.  Musculoskeletal: She exhibits no edema.  Pain with ROM and palpation of L knee.  Lymphadenopathy:    She has no cervical adenopathy.  Neurological: She is alert and oriented to person, place, and time.  Psychiatric: Mood, memory, affect and judgment normal.  Vitals reviewed.      Recent Results (from the past 2160 hour(s))  POCT HgB A1C     Status: Abnormal   Collection Time: 06/27/16  1:34 PM  Result Value Ref Range   Hemoglobin A1C 7.3%      Assessment & Plan  Problem List Items Addressed This Visit      Cardiovascular and Mediastinum   Essential (primary) hypertension   Relevant Medications   losartan (COZAAR) 100 MG tablet     Endocrine   Diabetes mellitus type II, controlled; last hemoglobin A1c 6.6 (Chronic)   Relevant Medications   losartan (COZAAR) 100 MG tablet   metFORMIN (GLUCOPHAGE) 500 MG tablet     Musculoskeletal and Integument   Arthritis of knee    Other Visit Diagnoses    Diabetes mellitus without complication ([place])    -  Primary   Relevant Medications   losartan (COZAAR) 100 MG tablet   metFORMIN (GLUCOPHAGE) 500 MG tablet   Other Relevant Orders   POCT HgB A1C (Completed)      Meds ordered this encounter  Medications  . omeprazole (PRILOSEC) 20 MG capsule    Sig: Take 20 mg by mouth daily.  Marland Kitchen losartan (COZAAR) 100 MG tablet    Sig: Take 1 tablet (100 mg total) by mouth daily.    Dispense:  90 tablet    Refill:  3  . metFORMIN (GLUCOPHAGE) 500 MG tablet    Sig: Take 1/2 tablet each AM before breakfast    Dispense:  90 tablet    Refill:  3   1. Diabetes mellitus without complication ([place])  - POCT HgB A1C-7.3  2. Controlled type 2 diabetes mellitus without complication, without long-term current use of insulin ([facility])  - metFORMIN (GLUCOPHAGE) 500 MG tablet; Take 1/2 tablet each AM before breakfast   Dispense: 90 tablet; Refill: 3  3. Essential (primary) hypertension  - losartan (COZAAR) 100 MG tablet; Take 1 tablet (100 mg total) by mouth daily.  Dispense: 90 tablet; Refill: 3  4. Arthritis of knee

## 2016-07-31 ENCOUNTER — Other Ambulatory Visit: Payer: Self-pay | Admitting: Family Medicine

## 2016-07-31 DIAGNOSIS — E785 Hyperlipidemia, unspecified: Secondary | ICD-10-CM

## 2016-08-10 ENCOUNTER — Other Ambulatory Visit: Payer: Self-pay | Admitting: Family Medicine

## 2016-08-10 DIAGNOSIS — K219 Gastro-esophageal reflux disease without esophagitis: Secondary | ICD-10-CM

## 2016-09-19 DIAGNOSIS — E113393 Type 2 diabetes mellitus with moderate nonproliferative diabetic retinopathy without macular edema, bilateral: Secondary | ICD-10-CM | POA: Diagnosis not present

## 2016-09-19 LAB — HM DIABETES EYE EXAM

## 2016-10-07 ENCOUNTER — Ambulatory Visit: Payer: Medicare Other | Admitting: Family Medicine

## 2016-10-08 DIAGNOSIS — E083293 Diabetes mellitus due to underlying condition with mild nonproliferative diabetic retinopathy without macular edema, bilateral: Secondary | ICD-10-CM | POA: Diagnosis not present

## 2016-10-22 DIAGNOSIS — H26492 Other secondary cataract, left eye: Secondary | ICD-10-CM | POA: Diagnosis not present

## 2016-10-29 ENCOUNTER — Ambulatory Visit: Payer: Medicare Other

## 2016-11-01 ENCOUNTER — Ambulatory Visit (INDEPENDENT_AMBULATORY_CARE_PROVIDER_SITE_OTHER): Payer: Medicare Other

## 2016-11-01 DIAGNOSIS — Z23 Encounter for immunization: Secondary | ICD-10-CM

## 2016-11-19 ENCOUNTER — Other Ambulatory Visit: Payer: Self-pay | Admitting: Family Medicine

## 2016-11-21 DIAGNOSIS — M25561 Pain in right knee: Secondary | ICD-10-CM | POA: Diagnosis not present

## 2016-11-25 ENCOUNTER — Encounter: Payer: Self-pay | Admitting: Family Medicine

## 2016-11-25 ENCOUNTER — Ambulatory Visit (INDEPENDENT_AMBULATORY_CARE_PROVIDER_SITE_OTHER): Payer: Medicare Other | Admitting: Family Medicine

## 2016-11-25 VITALS — BP 136/72 | HR 74 | Temp 98.0°F | Resp 18 | Ht 63.0 in | Wt 172.0 lb

## 2016-11-25 DIAGNOSIS — E119 Type 2 diabetes mellitus without complications: Secondary | ICD-10-CM

## 2016-11-25 DIAGNOSIS — Z7689 Persons encountering health services in other specified circumstances: Secondary | ICD-10-CM | POA: Diagnosis not present

## 2016-11-25 DIAGNOSIS — I10 Essential (primary) hypertension: Secondary | ICD-10-CM | POA: Diagnosis not present

## 2016-11-25 DIAGNOSIS — E7800 Pure hypercholesterolemia, unspecified: Secondary | ICD-10-CM

## 2016-11-25 DIAGNOSIS — Z23 Encounter for immunization: Secondary | ICD-10-CM

## 2016-11-25 DIAGNOSIS — Z1239 Encounter for other screening for malignant neoplasm of breast: Secondary | ICD-10-CM

## 2016-11-25 DIAGNOSIS — Z1231 Encounter for screening mammogram for malignant neoplasm of breast: Secondary | ICD-10-CM

## 2016-11-25 LAB — CBC WITH DIFFERENTIAL/PLATELET
Basophils Absolute: 0 cells/uL (ref 0–200)
Basophils Relative: 0 %
EOS PCT: 2 %
Eosinophils Absolute: 106 cells/uL (ref 15–500)
HCT: 37.9 % (ref 35.0–45.0)
Hemoglobin: 11.9 g/dL — ABNORMAL LOW (ref 12.0–15.0)
LYMPHS PCT: 45 %
Lymphs Abs: 2385 cells/uL (ref 850–3900)
MCH: 25.9 pg — AB (ref 27.0–33.0)
MCHC: 31.4 g/dL — AB (ref 32.0–36.0)
MCV: 82.6 fL (ref 80.0–100.0)
MONOS PCT: 5 %
MPV: 10.4 fL (ref 7.5–12.5)
Monocytes Absolute: 265 cells/uL (ref 200–950)
NEUTROS ABS: 2544 {cells}/uL (ref 1500–7800)
Neutrophils Relative %: 48 %
PLATELETS: 205 10*3/uL (ref 140–400)
RBC: 4.59 MIL/uL (ref 3.80–5.10)
RDW: 14.9 % (ref 11.0–15.0)
WBC: 5.3 10*3/uL (ref 3.8–10.8)

## 2016-11-25 LAB — COMPLETE METABOLIC PANEL WITH GFR
ALT: 12 U/L (ref 6–29)
AST: 17 U/L (ref 10–35)
Albumin: 4 g/dL (ref 3.6–5.1)
Alkaline Phosphatase: 74 U/L (ref 33–130)
BUN: 11 mg/dL (ref 7–25)
CHLORIDE: 106 mmol/L (ref 98–110)
CO2: 27 mmol/L (ref 20–31)
Calcium: 9.4 mg/dL (ref 8.6–10.4)
Creat: 0.58 mg/dL — ABNORMAL LOW (ref 0.60–0.93)
GFR, EST NON AFRICAN AMERICAN: 89 mL/min (ref >=60)
GFR, Est African American: >89 mL/min (ref >=60)
GLUCOSE: 94 mg/dL (ref 70–99)
POTASSIUM: 4.2 mmol/L (ref 3.5–5.3)
SODIUM: 141 mmol/L (ref 135–146)
TOTAL PROTEIN: 6.4 g/dL (ref 6.1–8.1)
Total Bilirubin: 0.5 mg/dL (ref 0.2–1.2)

## 2016-11-25 LAB — LIPID PANEL
Cholesterol: 137 mg/dL (ref <200)
HDL: 50 mg/dL — AB (ref >50)
LDL CALC: 69 mg/dL (ref <100)
Total CHOL/HDL Ratio: 2.7 Ratio (ref <5.0)
Triglycerides: 89 mg/dL (ref <150)
VLDL: 18 mg/dL (ref <30)

## 2016-11-25 LAB — HEMOGLOBIN A1C
HEMOGLOBIN A1C: 6.2 % — AB (ref <5.7)
Mean Plasma Glucose: 131 mg/dL

## 2016-11-25 NOTE — Progress Notes (Signed)
Subjective:    Patient ID: Sherri Leon, female    DOB: [birth year], 81 y.o.   MRN: [id number]  HPI Patient is a very pleasant 81 year old white female here today to establish care. Last mammogram was in November 2016 and is therefore due. She had a bone density test in 2016 and this is up-to-date. She just had an eye exam and therefore this is up-to-date for 1 year. Based on her age does not require a Pap smear.  Also based on her age, I do not believe she requires a colonoscopy. Past medical history significant for diabetes mellitus type 2. She's taking metformin 500 mg a day. She states her fasting blood sugars in the morning are 130-140. Her two-hour postprandial blood sugars in the evening are less than 200 typically greater than 160. Otherwise she is doing well with no complications or concerns. She is due for Pneumovax 23. She's had Prevnar 13. She's had a shingles vaccine. She has had her flu shot. Diabetic foot exam is performed today is normal Past Medical History:  Diagnosis Date  . Diabetes mellitus without complication ([place])   . Hyperlipidemia   . Hypertension    Past Surgical History:  Procedure Laterality Date  . ABDOMINAL HYSTERECTOMY    . ABDOMINAL HYSTERECTOMY  1970  . APPENDECTOMY  1970  . CARDIAC CATHETERIZATION N/A 08/23/2015   Procedure: Left Heart Cath;  Surgeon: Leonie Man, MD;  Location: [facility];  Service: Cardiovascular;  Laterality: N/A;  . HERNIA REPAIR    . TONSILLECTOMY  1955   Current Outpatient Prescriptions on File Prior to Visit  Medication Sig Dispense Refill  . aspirin EC 81 MG tablet Take 81 mg by mouth every other day.     . Blood Glucose Monitoring Suppl ([place]) KIT 1 each by Does not apply route once. Dx E11.9 1 each 0  . GARLIC PO Take by mouth.    Marland Kitchen glucose blood (TRUETRACK TEST) test strip 1 each by Other route 2 (two) times daily. Check blood sugar twice daily. 100 each 11  . losartan (COZAAR) 100 MG tablet  Take 1 tablet (100 mg total) by mouth daily. 90 tablet 3  . metFORMIN (GLUCOPHAGE) 500 MG tablet Take 1/2 tablet each AM before breakfast (Patient taking differently: 500 mg. Take 1/2 tablet each AM before breakfast) 90 tablet 3  . Multiple Vitamins-Minerals (MULTIVITAMIN ADULT PO) Take by mouth.    Marland Kitchen omeprazole (PRILOSEC) 20 MG capsule TAKE ONE CAPSULE BY MOUTH TWICE DAILY WITH MEALS 60 capsule 12  . pravastatin (PRAVACHOL) 40 MG tablet TAKE ONE (1) TABLET EACH DAY 90 tablet 1   No current facility-administered medications on file prior to visit.    Not on File Social History   Social History  . Marital status: Married    Spouse name: N/A  . Number of children: N/A  . Years of education: N/A   Occupational History  . Not on file.   Social History Main Topics  . Smoking status: Never Smoker  . Smokeless tobacco: Never Used  . Alcohol use No  . Drug use: No  . Sexual activity: Not Currently   Other Topics Concern  . Not on file   Social History Narrative   ** Merged History Encounter **       Family History  Problem Relation Age of Onset  . Hypertension Mother       Review of Systems  All other systems reviewed and are negative.  Objective:   Physical Exam  Constitutional: She is oriented to person, place, and time. She appears well-developed and well-nourished. No distress.  HENT:  Head: Normocephalic and atraumatic.  Right Ear: External ear normal.  Left Ear: External ear normal.  Nose: Nose normal.  Mouth/Throat: Oropharynx is clear and moist. No oropharyngeal exudate.  Eyes: Conjunctivae and EOM are normal. Pupils are equal, round, and reactive to light. Right eye exhibits no discharge. Left eye exhibits no discharge. No scleral icterus.  Neck: Normal range of motion. Neck supple. No JVD present. No tracheal deviation present. No thyromegaly present.  Cardiovascular: Normal rate, regular rhythm, normal heart sounds and intact distal pulses.  Exam  reveals no gallop and no friction rub.   No murmur heard. Pulmonary/Chest: Effort normal and breath sounds normal. No stridor. No respiratory distress. She has no wheezes. She has no rales. She exhibits no tenderness.  Abdominal: Soft. Bowel sounds are normal. She exhibits no distension and no mass. There is no tenderness. There is no rebound and no guarding.  Musculoskeletal: Normal range of motion. She exhibits no edema, tenderness or deformity.  Lymphadenopathy:    She has no cervical adenopathy.  Neurological: She is alert and oriented to person, place, and time. She displays normal reflexes. No cranial nerve deficit. She exhibits normal muscle tone. Coordination normal.  Skin: Skin is warm. No rash noted. She is not diaphoretic. No erythema. No pallor.  Psychiatric: She has a normal mood and affect. Her behavior is normal. Judgment and thought content normal.  Vitals reviewed.         Assessment & Plan:  Controlled type 2 diabetes mellitus without complication, without long-term current use of insulin ([place]) - Plan: CBC with Differential/Platelet, COMPLETE METABOLIC PANEL WITH GFR, Lipid panel, Hemoglobin A1c, Microalbumin, urine  Benign essential HTN  Pure hypercholesterolemia  Encounter to establish care with new doctor  Breast cancer screening, high risk patient - Plan: MM Digital Screening  Patient's blood pressures well controlled. My goal hemoglobin A1c for this patient would be less than 7. Based on her reported blood sugar values, I believe I will increase her metformin 500 mg twice a day. I will check hemoglobin A1c to confirm this suspicion. I'll also check a urine microalbumin. Diabetic foot exam is performed today. Diabetic eye exam is up-to-date. I will check a fasting lipid panel. Goal LDL cholesterol is less than 100. I will schedule the patient for mammogram. [place] test is up-to-date. She does not require a Pap smear or colonoscopy. She received Pneumovax 23 today. All  other vaccines are up-to-date

## 2016-11-25 NOTE — Addendum Note (Signed)
Addended by: Shary Decamp B on: 11/25/2016 11:14 AM   Modules accepted: Orders

## 2016-11-26 ENCOUNTER — Other Ambulatory Visit: Payer: Self-pay | Admitting: Family Medicine

## 2016-11-26 DIAGNOSIS — E119 Type 2 diabetes mellitus without complications: Secondary | ICD-10-CM

## 2016-11-26 LAB — MICROALBUMIN, URINE: MICROALB UR: 0.2 mg/dL

## 2016-11-26 MED ORDER — METFORMIN HCL 500 MG PO TABS: 500.0000 mg | ORAL_TABLET | Freq: Two times a day (BID) | ORAL | 3 refills | Status: DC

## 2016-12-13 ENCOUNTER — Encounter: Payer: Self-pay | Admitting: Family Medicine

## 2016-12-13 DIAGNOSIS — Z1231 Encounter for screening mammogram for malignant neoplasm of breast: Secondary | ICD-10-CM | POA: Diagnosis not present

## 2017-01-14 ENCOUNTER — Other Ambulatory Visit: Payer: Self-pay | Admitting: Family Medicine

## 2017-01-14 MED ORDER — PRAVASTATIN SODIUM 40 MG PO TABS: ORAL_TABLET | ORAL | 1 refills | Status: DC

## 2017-01-16 ENCOUNTER — Ambulatory Visit (INDEPENDENT_AMBULATORY_CARE_PROVIDER_SITE_OTHER): Payer: Medicare Other | Admitting: Family Medicine

## 2017-01-16 ENCOUNTER — Encounter: Payer: Self-pay | Admitting: Family Medicine

## 2017-01-16 VITALS — BP 120/64 | HR 60 | Temp 98.0°F | Resp 18 | Ht 63.0 in | Wt 174.0 lb

## 2017-01-16 DIAGNOSIS — K222 Esophageal obstruction: Secondary | ICD-10-CM

## 2017-01-16 DIAGNOSIS — K219 Gastro-esophageal reflux disease without esophagitis: Secondary | ICD-10-CM | POA: Diagnosis not present

## 2017-01-16 DIAGNOSIS — R1310 Dysphagia, unspecified: Secondary | ICD-10-CM | POA: Diagnosis not present

## 2017-01-16 DIAGNOSIS — R1319 Other dysphagia: Secondary | ICD-10-CM

## 2017-01-16 MED ORDER — PANTOPRAZOLE SODIUM 40 MG PO TBEC: 40.0000 mg | DELAYED_RELEASE_TABLET | Freq: Two times a day (BID) | ORAL | 3 refills | Status: DC

## 2017-01-16 NOTE — Progress Notes (Signed)
Subjective:    Patient ID: Sherri Leon, female    DOB: 31-Mar-1936, 81 y.o.   MRN: [id number]  HPI Patient is a very pleasant 81 year old white female here today Reporting several weeks of reflux. Her food comes up into her throat and mouth frequently at night. She reports constant discomfort in her epigastric area. She is constantly having to clear her throat. She also reports dysphagia to solids. She states that it feels like food sticks in her distal esophagus. She has no problems with liquids but she does have significant problems with harder solid food. She's been taking omeprazole without benefit Past Medical History:  Diagnosis Date  . Diabetes mellitus without complication ([place])   . Hyperlipidemia   . Hypertension    Past Surgical History:  Procedure Laterality Date  . ABDOMINAL HYSTERECTOMY    . ABDOMINAL HYSTERECTOMY  1970  . APPENDECTOMY  1970  . CARDIAC CATHETERIZATION N/A 08/23/2015   Procedure: Left Heart Cath;  Surgeon: Leonie Man, MD;  Location: [facility];  Service: Cardiovascular;  Laterality: N/A;  . HERNIA REPAIR    . TONSILLECTOMY  1955   Current Outpatient Prescriptions on File Prior to Visit  Medication Sig Dispense Refill  . aspirin EC 81 MG tablet Take 81 mg by mouth every other day.     . Blood Glucose Monitoring Suppl ([place]) KIT 1 each by Does not apply route once. Dx E11.9 1 each 0  . GARLIC PO Take by mouth.    Marland Kitchen glucose blood (TRUETRACK TEST) test strip 1 each by Other route 2 (two) times daily. Check blood sugar twice daily. 100 each 11  . losartan (COZAAR) 100 MG tablet Take 1 tablet (100 mg total) by mouth daily. 90 tablet 3  . metFORMIN (GLUCOPHAGE) 500 MG tablet Take 1 tablet (500 mg total) by mouth 2 (two) times daily with a meal. 180 tablet 3    . Multiple Vitamins-Minerals (MULTIVITAMIN ADULT PO) Take by mouth.    Marland Kitchen omeprazole (PRILOSEC) 20 MG capsule TAKE ONE CAPSULE BY MOUTH TWICE DAILY WITH MEALS 60 capsule 12  . pravastatin (PRAVACHOL) 40 MG tablet TAKE ONE (1) TABLET EACH DAY 90 tablet 1   No current facility-administered medications on file prior to visit.    No Known Allergies Social History   Social History  . Marital status: Married    Spouse name: N/A  . Number of children: N/A  . Years of education: N/A   Occupational History  . Not on file.   Social History Main Topics  . Smoking status: Never Smoker  . Smokeless tobacco: Never Used  . Alcohol use No  . Drug use: No  . Sexual activity: Not Currently   Other Topics Concern  . Not on file   Social History Narrative   ** Merged History Encounter **       Family History  Problem Relation Age of Onset  . Hypertension Mother  Review of Systems  All other systems reviewed and are negative.      Objective:   Physical Exam  Constitutional: She is oriented to person, place, and time. She appears well-developed and well-nourished. No distress.  HENT:  Head: Normocephalic and atraumatic.  Right Ear: External ear normal.  Left Ear: External ear normal.  Nose: Nose normal.  Mouth/Throat: Oropharynx is clear and moist. No oropharyngeal exudate.  Eyes: Conjunctivae and EOM are normal. Pupils are equal, round, and reactive to light. Right eye exhibits no discharge. Left eye exhibits no discharge. No scleral icterus.  Neck: Normal range of motion. Neck supple. No JVD present. No tracheal deviation present. No thyromegaly present.  Cardiovascular: Normal rate, regular rhythm, normal heart sounds and intact distal pulses.  Exam reveals no gallop and no friction rub.   No murmur heard. Pulmonary/Chest: Effort normal and breath sounds normal. No stridor. No respiratory distress. She has no wheezes. She has no rales. She exhibits no tenderness.   Abdominal: Soft. Bowel sounds are normal. She exhibits no distension and no mass. There is no tenderness. There is no rebound and no guarding.  Musculoskeletal: Normal range of motion. She exhibits no edema, tenderness or deformity.  Lymphadenopathy:    She has no cervical adenopathy.  Neurological: She is alert and oriented to person, place, and time. She displays normal reflexes. No cranial nerve deficit. She exhibits normal muscle tone. Coordination normal.  Skin: Skin is warm. No rash noted. She is not diaphoretic. No erythema. No pallor.  Psychiatric: She has a normal mood and affect. Her behavior is normal. Judgment and thought content normal.  Vitals reviewed.         Assessment & Plan:  Esophageal dysphagia - Plan: pantoprazole (PROTONIX) 40 MG tablet, Ambulatory referral to Gastroenterology  GERD with stricture - Plan: Ambulatory referral to Gastroenterology  Symptoms are consistent with GERD and also possibly an esophageal stricture causing her dysphagia. Discontinue omeprazole and replace with proton asked 40 mg by mouth twice a day and consult her gastroenterologist, Dr. Vira Agar, for an EGD and possible dilatation if necessary

## 2017-02-26 ENCOUNTER — Other Ambulatory Visit: Payer: Self-pay | Admitting: Family Medicine

## 2017-02-26 DIAGNOSIS — E119 Type 2 diabetes mellitus without complications: Secondary | ICD-10-CM

## 2017-03-14 DIAGNOSIS — Z86010 Personal history of colonic polyps: Secondary | ICD-10-CM | POA: Diagnosis not present

## 2017-03-14 DIAGNOSIS — K219 Gastro-esophageal reflux disease without esophagitis: Secondary | ICD-10-CM | POA: Diagnosis not present

## 2017-03-14 DIAGNOSIS — R1319 Other dysphagia: Secondary | ICD-10-CM | POA: Diagnosis not present

## 2017-03-25 ENCOUNTER — Telehealth: Payer: Self-pay | Admitting: Family Medicine

## 2017-03-25 DIAGNOSIS — E119 Type 2 diabetes mellitus without complications: Secondary | ICD-10-CM

## 2017-03-25 MED ORDER — METFORMIN HCL 500 MG PO TABS: ORAL_TABLET | ORAL | 1 refills | Status: DC

## 2017-03-25 NOTE — Telephone Encounter (Signed)
Needed rx sent to pharm - sent rx to requested pharm - pt aware

## 2017-03-25 NOTE — Telephone Encounter (Signed)
[phone]  Patient calling to say that her metformin prescription is messed up at the pharmacy and she will be out tomorrow, would like a call back asap to get this straightened out

## 2017-04-18 ENCOUNTER — Encounter: Payer: Self-pay | Admitting: Family Medicine

## 2017-04-18 ENCOUNTER — Other Ambulatory Visit: Payer: Self-pay | Admitting: Family Medicine

## 2017-04-18 ENCOUNTER — Ambulatory Visit (INDEPENDENT_AMBULATORY_CARE_PROVIDER_SITE_OTHER): Payer: Medicare Other | Admitting: Family Medicine

## 2017-04-18 VITALS — BP 126/72 | HR 74 | Temp 97.7°F | Resp 16 | Ht 63.0 in | Wt 174.0 lb

## 2017-04-18 DIAGNOSIS — E119 Type 2 diabetes mellitus without complications: Secondary | ICD-10-CM | POA: Diagnosis not present

## 2017-04-18 LAB — COMPLETE METABOLIC PANEL WITH GFR
ALBUMIN: 4 g/dL (ref 3.6–5.1)
ALK PHOS: 83 U/L (ref 33–130)
ALT: 12 U/L (ref 6–29)
AST: 18 U/L (ref 10–35)
BILIRUBIN TOTAL: 0.4 mg/dL (ref 0.2–1.2)
BUN: 8 mg/dL (ref 7–25)
CO2: 24 mmol/L (ref 20–31)
Calcium: 9.4 mg/dL (ref 8.6–10.4)
Chloride: 107 mmol/L (ref 98–110)
Creat: 0.63 mg/dL (ref 0.60–0.93)
GFR, EST NON AFRICAN AMERICAN: 86 mL/min (ref >=60)
GLUCOSE: 109 mg/dL — AB (ref 70–99)
POTASSIUM: 4.2 mmol/L (ref 3.5–5.3)
SODIUM: 142 mmol/L (ref 135–146)
Total Protein: 6.4 g/dL (ref 6.1–8.1)

## 2017-04-18 LAB — CBC WITH DIFFERENTIAL/PLATELET
BASOS ABS: 0 {cells}/uL (ref 0–200)
Basophils Relative: 0 %
EOS ABS: 118 {cells}/uL (ref 15–500)
EOS PCT: 2 %
HCT: 33.7 % — ABNORMAL LOW (ref 35.0–45.0)
Hemoglobin: 10.7 g/dL — ABNORMAL LOW (ref 12.0–15.0)
Lymphocytes Relative: 49 %
Lymphs Abs: 2891 cells/uL (ref 850–3900)
MCH: 24.9 pg — ABNORMAL LOW (ref 27.0–33.0)
MCHC: 31.8 g/dL — AB (ref 32.0–36.0)
MCV: 78.4 fL — AB (ref 80.0–100.0)
MONOS PCT: 6 %
MPV: 10.5 fL (ref 7.5–12.5)
Monocytes Absolute: 354 cells/uL (ref 200–950)
NEUTROS PCT: 43 %
Neutro Abs: 2537 cells/uL (ref 1500–7800)
PLATELETS: 244 10*3/uL (ref 140–400)
RBC: 4.3 MIL/uL (ref 3.80–5.10)
RDW: 15.3 % — ABNORMAL HIGH (ref 11.0–15.0)
WBC: 5.9 10*3/uL (ref 3.8–10.8)

## 2017-04-18 LAB — LIPID PANEL
CHOL/HDL RATIO: 2.9 ratio (ref <5.0)
Cholesterol: 129 mg/dL (ref <200)
HDL: 45 mg/dL — AB (ref >50)
LDL Cholesterol: 50 mg/dL (ref <100)
TRIGLYCERIDES: 170 mg/dL — AB (ref <150)
VLDL: 34 mg/dL — ABNORMAL HIGH (ref <30)

## 2017-04-18 NOTE — Progress Notes (Signed)
Subjective:    Patient ID: Sherri Leon, female    DOB: 01/07/1936, 81 y.o.   MRN: [id number]  HPI  11/2016 Patient is a very pleasant 81 year old white female here today to establish care. Last mammogram was in November 2016 and is therefore due. She had a bone density test in 2016 and this is up-to-date. She just had an eye exam and therefore this is up-to-date for 1 year. Based on her age does not require a Pap smear.  Also based on her age, I do not believe she requires a colonoscopy. Past medical history significant for diabetes mellitus type 2. She's taking metformin 500 mg a day. She states her fasting blood sugars in the morning are 130-140. Her two-hour postprandial blood sugars in the evening are less than 200 typically greater than 160. Otherwise she is doing well with no complications or concerns. She is due for Pneumovax 23. She's had Prevnar 13. She's had a shingles vaccine. She has had her flu shot. Diabetic foot exam is performed today is normal.  At that time, my plan was: Patient's blood pressures well controlled. My goal hemoglobin A1c for this patient would be less than 7. Based on her reported blood sugar values, I believe I will increase her metformin 500 mg twice a day. I will check hemoglobin A1c to confirm this suspicion. I'll also check a urine microalbumin. Diabetic foot exam is performed today. Diabetic eye exam is up-to-date. I will check a fasting lipid panel. Goal LDL cholesterol is less than 100. I will schedule the patient for mammogram.  test is up-to-date. She does not require a Pap smear or colonoscopy. She received Pneumovax 23 today. All other vaccines are up-to-date  04/18/17 Last hemoglobin A1c was 6.3. The patient is currently taking metformin twice a day. She denies any hypoglycemic episode but she is interested in possibly discontinuing the medication. She denies any polyuria, polydipsia, blurry vision. She denies any chest pain shortness of breath or dyspnea  on exertion. Her fasting blood sugars are less than 120 Past Medical History:  Diagnosis Date  . Diabetes mellitus without complication ([place])   . Hyperlipidemia   . Hypertension    Past Surgical History:  Procedure Laterality Date  . ABDOMINAL HYSTERECTOMY    . ABDOMINAL HYSTERECTOMY  1970  . APPENDECTOMY  1970  . CARDIAC CATHETERIZATION N/A 08/23/2015   Procedure: Left Heart Cath;  Surgeon: Leonie Man, MD;  Location: [facility];  Service: Cardiovascular;  Laterality: N/A;  . HERNIA REPAIR    . TONSILLECTOMY  1955   Current Outpatient Prescriptions on File Prior to Visit  Medication Sig Dispense Refill  . aspirin EC 81 MG tablet Take 81 mg by mouth every other day.     . Blood Glucose Monitoring Suppl ([place]) KIT 1 each by Does not apply route once. Dx E11.9 1 each 0  . GARLIC PO Take by mouth.    Marland Kitchen glucose blood (TRUETRACK TEST) test strip 1 each by Other route 2 (two) times daily. Check blood sugar twice daily. 100 each 11  . losartan (COZAAR) 100 MG tablet Take 1 tablet (100 mg total) by mouth daily. 90 tablet 3  . metFORMIN (GLUCOPHAGE) 500 MG tablet TAKE ONE TABLET TWICE DAILY WITH MEALS 180 tablet 1  . Multiple Vitamins-Minerals (MULTIVITAMIN ADULT PO) Take by mouth.    Marland Kitchen omeprazole (PRILOSEC) 20 MG capsule TAKE ONE CAPSULE BY MOUTH TWICE DAILY WITH MEALS 60 capsule 12  .  pantoprazole (PROTONIX) 40 MG tablet Take 1 tablet (40 mg total) by mouth 2 (two) times daily. 60 tablet 3  . pravastatin (PRAVACHOL) 40 MG tablet TAKE ONE (1) TABLET EACH DAY 90 tablet 1   No current facility-administered medications on file prior to visit.    No Known Allergies Social History   Social History  . Marital status: Married    Spouse name: N/A  . Number of children: N/A  . Years of education: N/A   Occupational History  . Not on file.   Social History Main Topics  . Smoking status: Never Smoker  . Smokeless tobacco: Never Used  . Alcohol use No  .  Drug use: No  . Sexual activity: Not Currently   Other Topics Concern  . Not on file   Social History Narrative   ** Merged History Encounter **       Family History  Problem Relation Age of Onset  . Hypertension Mother       Review of Systems  All other systems reviewed and are negative.      Objective:   Physical Exam  Constitutional: She is oriented to person, place, and time. She appears well-developed and well-nourished. No distress.  HENT:  Head: Normocephalic and atraumatic.  Right Ear: External ear normal.  Left Ear: External ear normal.  Nose: Nose normal.  Mouth/Throat: Oropharynx is clear and moist. No oropharyngeal exudate.  Eyes: Conjunctivae and EOM are normal. Pupils are equal, round, and reactive to light. Right eye exhibits no discharge. Left eye exhibits no discharge. No scleral icterus.  Neck: Normal range of motion. Neck supple. No JVD present. No tracheal deviation present. No thyromegaly present.  Cardiovascular: Normal rate, regular rhythm, normal heart sounds and intact distal pulses.  Exam reveals no gallop and no friction rub.   No murmur heard. Pulmonary/Chest: Effort normal and breath sounds normal. No stridor. No respiratory distress. She has no wheezes. She has no rales. She exhibits no tenderness.  Abdominal: Soft. Bowel sounds are normal. She exhibits no distension and no mass. There is no tenderness. There is no rebound and no guarding.  Musculoskeletal: Normal range of motion. She exhibits no edema, tenderness or deformity.  Lymphadenopathy:    She has no cervical adenopathy.  Neurological: She is alert and oriented to person, place, and time. She displays normal reflexes. No cranial nerve deficit. She exhibits normal muscle tone. Coordination normal.  Skin: Skin is warm. No rash noted. She is not diaphoretic. No erythema. No pallor.  Psychiatric: She has a normal mood and affect. Her behavior is normal. Judgment and thought content normal.    Vitals reviewed.         Assessment & Plan:  Controlled type 2 diabetes mellitus without complication, without long-term current use of insulin ([place]) - Plan: CBC with Differential/Platelet, COMPLETE METABOLIC PANEL WITH GFR, Lipid panel, Microalbumin, urine, Hemoglobin A1c  Check hemoglobin A1c. Hemoglobin A1c is less than 6.5, I believe it would be reasonable to discontinue metformin and monitor the sugars closely. I will be willing to except a hemoglobin A1c of 7 and a patient this age without concern.

## 2017-04-19 LAB — HEMOGLOBIN A1C
HEMOGLOBIN A1C: 6.2 % — AB (ref <5.7)
Mean Plasma Glucose: 131 mg/dL

## 2017-04-19 LAB — MICROALBUMIN, URINE: Microalb, Ur: 0.9 mg/dL

## 2017-04-23 ENCOUNTER — Encounter: Payer: Self-pay | Admitting: Family Medicine

## 2017-04-23 DIAGNOSIS — D509 Iron deficiency anemia, unspecified: Secondary | ICD-10-CM | POA: Insufficient documentation

## 2017-04-23 LAB — IRON,TIBC AND FERRITIN PANEL
%SAT: 10 % — ABNORMAL LOW (ref 11–50)
FERRITIN: 5 ng/mL — AB (ref 20–288)
IRON: 37 ug/dL — AB (ref 45–160)
TIBC: 387 ug/dL (ref 250–450)

## 2017-04-30 ENCOUNTER — Ambulatory Visit (INDEPENDENT_AMBULATORY_CARE_PROVIDER_SITE_OTHER): Payer: Medicare Other | Admitting: Physician Assistant

## 2017-04-30 ENCOUNTER — Encounter: Payer: Self-pay | Admitting: Physician Assistant

## 2017-04-30 VITALS — BP 122/72 | HR 68 | Temp 97.8°F | Resp 16 | Wt 171.2 lb

## 2017-04-30 DIAGNOSIS — J988 Other specified respiratory disorders: Secondary | ICD-10-CM

## 2017-04-30 DIAGNOSIS — B9689 Other specified bacterial agents as the cause of diseases classified elsewhere: Principal | ICD-10-CM

## 2017-04-30 MED ORDER — FLUCONAZOLE 150 MG PO TABS: 150.0000 mg | ORAL_TABLET | Freq: Once | ORAL | 0 refills | Status: AC

## 2017-04-30 MED ORDER — AZITHROMYCIN 250 MG PO TABS: ORAL_TABLET | ORAL | 0 refills | Status: DC

## 2017-04-30 NOTE — Progress Notes (Signed)
  Patient ID: Sherri Leon MRN: [id number], DOB: 01/13/1936, 81 y.o. Date of Encounter: 04/30/2017, 4:37 PM    Chief Complaint:  Chief Complaint  Patient presents with  . chest congestion    x1 wk   . Cough  . Tick Removal    removed tick from under left arm      HPI: 81 y.o. year old female presents with above.   Patient states that she has been having some chest congestion and coughing productive of yellow phlegm for about a week now. Inc. inking it would clear up and go away but it isn't so decided had to come in. Is not having any congestion in her head or nose. No sore throat. No fevers or chills.  She removed a tick from the underside of her left upper arm several days ago and wanted me to check to make sure that she had gotten the entire tick off. Has had no other areas of rash. No fever. No headache. No myalgias or malaise.  No other concerns to address today.     Home Meds:   Outpatient Medications Prior to Visit  Medication Sig Dispense Refill  . aspirin EC 81 MG tablet Take 81 mg by mouth every other day.     . Blood Glucose Monitoring Suppl (TRUETRACK SMART SYSTEM) KIT 1 each by Does not apply route once. Dx E11.9 1 each 0  . GARLIC PO Take by mouth.    . glucose blood (TRUETRACK TEST) test strip 1 each by Other route 2 (two) times daily. Check blood sugar twice daily. 100 each 11  . losartan (COZAAR) 100 MG tablet Take 1 tablet (100 mg total) by mouth daily. 90 tablet 3  . metFORMIN (GLUCOPHAGE) 500 MG tablet TAKE ONE TABLET TWICE DAILY WITH MEALS (Patient taking differently: TAKE ONE TABLET TWICE DAILY WITH MEALS) 180 tablet 1  . Multiple Vitamins-Minerals (MULTIVITAMIN ADULT PO) Take by mouth.    . omeprazole (PRILOSEC) 20 MG capsule TAKE ONE CAPSULE BY MOUTH TWICE DAILY WITH MEALS 60 capsule 12  . pantoprazole (PROTONIX) 40 MG tablet Take 1 tablet (40 mg total) by mouth 2 (two) times daily. 60 tablet 3  . pravastatin (PRAVACHOL) 40 MG tablet TAKE ONE (1)  TABLET EACH DAY 90 tablet 1   No facility-administered medications prior to visit.     Allergies: No Known Allergies    Review of Systems: See HPI for pertinent ROS. All other ROS negative.    Physical Exam: Blood pressure 122/72, pulse 68, temperature 97.8 F (36.6 C), temperature source Oral, resp. rate 16, weight 171 lb 3.2 oz (77.7 kg), SpO2 98 %., Body mass index is 30.33 kg/m. General:  WNWD WF. Appears in no acute distress. HEENT: Normocephalic, atraumatic, eyes without discharge, sclera non-icteric, nares are without discharge. Bilateral auditory canals clear, TM's are without perforation, pearly grey and translucent with reflective cone of light bilaterally. Oral cavity moist, posterior pharynx without exudate, erythema, peritonsillar abscess.  Neck: Supple. No thyromegaly. No lymphadenopathy. Lungs: Clear bilaterally to auscultation without wheezes, rales, or rhonchi. Breathing is unlabored. Heart: Regular rhythm. No murmurs, rubs, or gallops. Msk:  Strength and tone normal for age. Extremities/Skin: Underside of left upper arm: There is an approximate 3 mm pink papule--site where tick bite had been. See no residual parts of tick present. There are no additional areas of additional rash on the remainder of her skin. Neuro: Alert and oriented X 3. Moves all extremities spontaneously. Gait is normal. CNII-XII grossly   in tact. Psych:  Responds to questions appropriately with a normal affect.     ASSESSMENT AND PLAN:  81 y.o. year old female with  1. Bacterial respiratory infection She states that Z-Pak usually gives her a yeast infection. I told her if she develops symptoms of yeast infection 2.take the Diflucan. Follow-up if needed. - azithromycin (ZITHROMAX) 250 MG tablet; Day 1: Take 2 once a day. Days 2 -5: Take 1 daily.  Dispense: 6 tablet; Refill: 0 - fluconazole (DIFLUCAN) 150 MG tablet; Take 1 tablet (150 mg total) by mouth once.  Dispense: 1 tablet; Refill:  0  Reassured her that site of tick bite looks fine and I see no signs of residual parts of tick remaining. No signs or symptoms of Lyme [place] spotted fever at this time. Follow-up if develops these.  Signed, Mary Beth Dixon, PA, BSFM 04/30/2017 4:37 PM   

## 2017-06-12 ENCOUNTER — Encounter: Payer: Self-pay | Admitting: *Deleted

## 2017-06-13 ENCOUNTER — Ambulatory Visit: Payer: Medicare Other | Admitting: Anesthesiology

## 2017-06-13 ENCOUNTER — Ambulatory Visit
Admission: RE | Admit: 2017-06-13 | Discharge: 2017-06-13 | Disposition: A | Discharge status: Home / Self Care | Payer: Medicare Other | Source: Ambulatory Visit | Attending: Unknown Physician Specialty | Admitting: Unknown Physician Specialty

## 2017-06-13 ENCOUNTER — Encounter
Admission: RE | Disposition: A | Discharge status: Home / Self Care | Payer: Self-pay | Source: Ambulatory Visit | Attending: Unknown Physician Specialty

## 2017-06-13 ENCOUNTER — Encounter: Payer: Self-pay | Admitting: *Deleted

## 2017-06-13 DIAGNOSIS — Z7982 Long term (current) use of aspirin: Secondary | ICD-10-CM | POA: Diagnosis not present

## 2017-06-13 DIAGNOSIS — E119 Type 2 diabetes mellitus without complications: Secondary | ICD-10-CM | POA: Insufficient documentation

## 2017-06-13 DIAGNOSIS — K222 Esophageal obstruction: Secondary | ICD-10-CM | POA: Diagnosis not present

## 2017-06-13 DIAGNOSIS — K5730 Diverticulosis of large intestine without perforation or abscess without bleeding: Secondary | ICD-10-CM | POA: Diagnosis not present

## 2017-06-13 DIAGNOSIS — E785 Hyperlipidemia, unspecified: Secondary | ICD-10-CM | POA: Insufficient documentation

## 2017-06-13 DIAGNOSIS — K5790 Diverticulosis of intestine, part unspecified, without perforation or abscess without bleeding: Secondary | ICD-10-CM | POA: Diagnosis not present

## 2017-06-13 DIAGNOSIS — Z86010 Personal history of colonic polyps: Secondary | ICD-10-CM | POA: Diagnosis not present

## 2017-06-13 DIAGNOSIS — K640 First degree hemorrhoids: Secondary | ICD-10-CM | POA: Insufficient documentation

## 2017-06-13 DIAGNOSIS — K649 Unspecified hemorrhoids: Secondary | ICD-10-CM | POA: Diagnosis not present

## 2017-06-13 DIAGNOSIS — Z79899 Other long term (current) drug therapy: Secondary | ICD-10-CM | POA: Insufficient documentation

## 2017-06-13 DIAGNOSIS — Z1211 Encounter for screening for malignant neoplasm of colon: Secondary | ICD-10-CM | POA: Insufficient documentation

## 2017-06-13 DIAGNOSIS — I10 Essential (primary) hypertension: Secondary | ICD-10-CM | POA: Diagnosis not present

## 2017-06-13 DIAGNOSIS — Z7984 Long term (current) use of oral hypoglycemic drugs: Secondary | ICD-10-CM | POA: Diagnosis not present

## 2017-06-13 DIAGNOSIS — K449 Diaphragmatic hernia without obstruction or gangrene: Secondary | ICD-10-CM | POA: Diagnosis not present

## 2017-06-13 HISTORY — PX: COLONOSCOPY WITH PROPOFOL: SHX5780

## 2017-06-13 HISTORY — PX: ESOPHAGOGASTRODUODENOSCOPY (EGD) WITH PROPOFOL: SHX5813

## 2017-06-13 LAB — HM COLONOSCOPY

## 2017-06-13 SURGERY — ESOPHAGOGASTRODUODENOSCOPY (EGD) WITH PROPOFOL
Anesthesia: General

## 2017-06-13 MED ORDER — LIDOCAINE HCL (PF) 2 % IJ SOLN
INTRAMUSCULAR | Status: AC
  Filled 2017-06-13: qty 2

## 2017-06-13 MED ORDER — SODIUM CHLORIDE 0.9 % IV SOLN: INTRAVENOUS | Status: DC

## 2017-06-13 MED ORDER — SODIUM CHLORIDE 0.9 % IV SOLN
INTRAVENOUS | Status: DC
  Administered 2017-06-13: 1000 mL via INTRAVENOUS

## 2017-06-13 MED ORDER — PHENYLEPHRINE HCL 10 MG/ML IJ SOLN
INTRAMUSCULAR | Status: DC | PRN
  Administered 2017-06-13: 100 ug via INTRAVENOUS

## 2017-06-13 MED ORDER — MIDAZOLAM HCL 2 MG/2ML IJ SOLN
INTRAMUSCULAR | Status: DC | PRN
  Administered 2017-06-13: 1 mg via INTRAVENOUS

## 2017-06-13 MED ORDER — PROPOFOL 500 MG/50ML IV EMUL
INTRAVENOUS | Status: DC | PRN
  Administered 2017-06-13: 100 ug/kg/min via INTRAVENOUS

## 2017-06-13 MED ORDER — GLYCOPYRROLATE 0.2 MG/ML IJ SOLN
INTRAMUSCULAR | Status: DC | PRN
  Administered 2017-06-13: 0.2 mg via INTRAVENOUS

## 2017-06-13 MED ORDER — MIDAZOLAM HCL 2 MG/2ML IJ SOLN
INTRAMUSCULAR | Status: AC
  Filled 2017-06-13: qty 2

## 2017-06-13 MED ORDER — LIDOCAINE HCL (CARDIAC) 20 MG/ML IV SOLN
INTRAVENOUS | Status: DC | PRN
  Administered 2017-06-13: 50 mg via INTRAVENOUS

## 2017-06-13 MED ORDER — PROPOFOL 10 MG/ML IV BOLUS
INTRAVENOUS | Status: DC | PRN
  Administered 2017-06-13: 50 mg via INTRAVENOUS

## 2017-06-13 NOTE — H&P (Signed)
Primary Care Physician:  Susy Frizzle, MD Primary Gastroenterologist:  Dr. Vira Agar  Pre-Procedure History & Physical: HPI:  Sherri Leon is a 81 y.o. female is here for an endoscopy and colonoscopy.   Past Medical History:  Diagnosis Date  . Diabetes mellitus without complication ([place])   . Hyperlipidemia   . Hypertension   . Iron deficiency anemia     Past Surgical History:  Procedure Laterality Date  . ABDOMINAL HYSTERECTOMY    . ABDOMINAL HYSTERECTOMY  1970  . APPENDECTOMY  1970  . CARDIAC CATHETERIZATION N/A 08/23/2015   Procedure: Left Heart Cath;  Surgeon: Leonie Man, MD;  Location: [facility];  Service: Cardiovascular;  Laterality: N/A;  . HERNIA REPAIR    . TONSILLECTOMY  1955    Prior to Admission medications   Medication Sig Start Date End Date Taking? Authorizing Provider  losartan (COZAAR) 100 MG tablet Take 1 tablet (100 mg total) by mouth daily. 06/27/16  Yes Arlis Porta., MD  aspirin EC 81 MG tablet Take 81 mg by mouth every other day.     [provider]  Blood Glucose Monitoring Suppl ([place]) KIT 1 each by Does not apply route once. Dx E11.9 09/18/15   Arlis Porta., MD  GARLIC PO Take by mouth.    [provider]  glucose blood (TRUETRACK TEST) test strip 1 each by Other route 2 (two) times daily. Check blood sugar twice daily. 11/19/16   Arlis Porta., MD  metFORMIN (GLUCOPHAGE) 500 MG tablet TAKE ONE TABLET TWICE DAILY WITH MEALS Patient taking differently: TAKE ONE TABLET TWICE DAILY WITH MEALS 03/25/17   Susy Frizzle, MD  Multiple Vitamins-Minerals (MULTIVITAMIN ADULT PO) Take by mouth.    [provider]  omeprazole (PRILOSEC) 20 MG capsule TAKE ONE CAPSULE BY MOUTH TWICE DAILY WITH MEALS 08/12/16   Arlis Porta., MD  pantoprazole (PROTONIX) 40 MG tablet Take 1 tablet (40 mg total) by mouth 2 (two) times daily. 01/16/17   Susy Frizzle, MD  pravastatin  (PRAVACHOL) 40 MG tablet TAKE ONE (1) TABLET EACH DAY 01/14/17   Susy Frizzle, MD    Allergies as of 03/17/2017  . (No Known Allergies)    Family History  Problem Relation Age of Onset  . Hypertension Mother     Social History   Social History  . Marital status: Married    Spouse name: N/A  . Number of children: N/A  . Years of education: N/A   Occupational History  . Not on file.   Social History Main Topics  . Smoking status: Never Smoker  . Smokeless tobacco: Never Used  . Alcohol use No  . Drug use: No  . Sexual activity: Not Currently   Other Topics Concern  . Not on file   Social History Narrative   ** Merged History Encounter **        Review of Systems: See HPI, otherwise negative ROS  Physical Exam: BP (!) 146/98   Pulse 60   Temp (!) 97.3 F (36.3 C) (Tympanic)   Resp 16   Ht '5\' 7"'  (1.702 m)   Wt 73.5 kg (162 lb)   SpO2 100%   BMI 25.37 kg/m  General:   Alert,  pleasant and cooperative in NAD Head:  Normocephalic and atraumatic. Neck:  Supple; no masses or thyromegaly. Lungs:  Clear throughout to auscultation.    Heart:  Regular rate and  rhythm. Abdomen:  Soft, nontender and nondistended. Normal bowel sounds, without guarding, and without rebound.   Neurologic:  Alert and  oriented x4;  grossly normal neurologically.  Impression/Plan: [name] is here for an endoscopy and colonoscopy to be performed for [facility] colon polyps and dysphagia.  Risks, benefits, limitations, and alternatives regarding  endoscopy and colonoscopy have been reviewed with the patient.  Questions have been answered.  All parties agreeable.   Gaylyn Cheers, MD  06/13/2017, 9:23 AM

## 2017-06-13 NOTE — Anesthesia Preprocedure Evaluation (Signed)
Anesthesia Evaluation  Patient identified by MRN, date of birth, ID band Patient awake    Reviewed: Allergy & Precautions, H&P , NPO status , Patient's Chart, lab work & pertinent test results, reviewed documented beta blocker date and time   Airway Mallampati: II   Neck ROM: full    Dental  (+) Poor Dentition   Pulmonary neg pulmonary ROS,    Pulmonary exam normal        Cardiovascular hypertension, negative cardio ROS Normal cardiovascular exam Rhythm:regular Rate:Normal     Neuro/Psych negative neurological ROS  negative psych ROS   GI/Hepatic negative GI ROS, Neg liver ROS, GERD  Medicated,  Endo/Other  negative endocrine ROSdiabetes  Renal/GU negative Renal ROS  negative genitourinary   Musculoskeletal   Abdominal   Peds  Hematology negative hematology ROS (+) anemia ,   Anesthesia Other Findings Past Medical History: No date: Diabetes mellitus without complication ([facility]) No date: Hyperlipidemia No date: Hypertension No date: Iron deficiency anemia Past Surgical History: No date: ABDOMINAL HYSTERECTOMY 1970: ABDOMINAL HYSTERECTOMY 1970: APPENDECTOMY 08/23/2015: CARDIAC CATHETERIZATION; N/A     Comment:  Procedure: Left Heart Cath;  Surgeon: Leonie Man,               MD;  Location: [facility];  Service:               Cardiovascular;  Laterality: N/A; No date: HERNIA REPAIR 1955: TONSILLECTOMY   Reproductive/Obstetrics negative OB ROS                             Anesthesia Physical Anesthesia Plan  ASA: II  Anesthesia Plan: General   Post-op Pain Management:    Induction:   PONV Risk Score and Plan:   Airway Management Planned:   Additional Equipment:   Intra-op Plan:   Post-operative Plan:   Informed Consent: I have reviewed the patients History and Physical, chart, labs and discussed the procedure including the risks, benefits and alternatives  for the proposed anesthesia with the patient or authorized representative who has indicated his/her understanding and acceptance.   Dental Advisory Given  Plan Discussed with: CRNA  Anesthesia Plan Comments:         Anesthesia Quick Evaluation

## 2017-06-13 NOTE — Op Note (Signed)
[facility] Gastroenterology Patient Name: Sherri Leon Procedure Date: 06/13/2017 9:27 AM MRN: [id number] Account #: [id number] Date of Birth: Oct 03, 1936 Admit Type: Outpatient Age: 81 Room: [facility] [address] ENDO ROOM 1 Gender: Female Note Status: Finalized Procedure:            Colonoscopy Indications:          High risk colon cancer surveillance: Personal history                        of colonic polyps Providers:            Manya Silvas, MD Referring MD:         Cammie Mcgee. Pickard (Referring MD) Medicines:            Propofol per Anesthesia Complications:        No immediate complications. Procedure:            Pre-Anesthesia Assessment:                       - After reviewing the risks and benefits, the patient                        was deemed in satisfactory condition to undergo the                        procedure.                       After obtaining informed consent, the colonoscope was                        passed under direct vision. Throughout the procedure,                        the patient's blood pressure, pulse, and oxygen                        saturations were monitored continuously. The                        Colonoscope was introduced through the anus and                        advanced to the the cecum, identified by appendiceal                        orifice and ileocecal valve. The colonoscopy was                        performed without difficulty. The patient tolerated the                        procedure well. The quality of the bowel preparation                        was adequate to identify polyps. Findings:      Multiple medium-mouthed diverticula were found in the sigmoid colon,       descending colon and transverse colon.      Internal hemorrhoids were found during endoscopy. The hemorrhoids were       small and Grade I (internal hemorrhoids  that do not prolapse).      The exam was otherwise without abnormality. Impression:            - Diverticulosis in the sigmoid colon, in the                        descending colon and in the transverse colon.                       - Internal hemorrhoids.                       - The examination was otherwise normal.                       - No specimens collected. Recommendation:       No repeat procedure recommended.                       - Resume previous diet indefinitely. Manya Silvas, MD 06/13/2017 10:03:09 AM This report has been signed electronically. Number of Addenda: 0 Note Initiated On: 06/13/2017 9:27 AM Scope Withdrawal Time: 0 hours 8 minutes 55 seconds  Total Procedure Duration: 0 hours 18 minutes 17 seconds       [facility]

## 2017-06-13 NOTE — Anesthesia Post-op Follow-up Note (Cosign Needed)
Anesthesia QCDR form completed.        

## 2017-06-13 NOTE — Op Note (Signed)
[facility] Gastroenterology Patient Name: Sherri Leon Procedure Date: 06/13/2017 9:28 AM MRN: [id number] Account #: [id number] Date of Birth: 1935-12-12 Admit Type: Outpatient Age: 81 Room: [facility] ENDO ROOM 1 Gender: Female Note Status: Finalized Procedure:            Upper GI endoscopy Indications:          Dysphagia Providers:            Manya Silvas, MD Medicines:            Propofol per Anesthesia Complications:        No immediate complications. Procedure:            Pre-Anesthesia Assessment:                       - After reviewing the risks and benefits, the patient                        was deemed in satisfactory condition to undergo the                        procedure.                       After obtaining informed consent, the endoscope was                        passed under direct vision. Throughout the procedure,                        the patient's blood pressure, pulse, and oxygen                        saturations were monitored continuously. The Endoscope                        was introduced through the mouth, and advanced to the                        second part of duodenum. The upper GI endoscopy was                        accomplished without difficulty. The patient tolerated                        the procedure well. Findings:      A mild Schatzki ring (acquired) was found at the gastroesophageal       junction. A guidewire was placed and the scope was withdrawn. Dilation       was performed with a Savary dilator with mild resistance at 17 mm.      A small hiatal hernia was present.      The stomach was normal.      The examined duodenum was normal. Impression:           - Mild Schatzki ring. Dilated.                       - Small hiatal hernia.                       - Normal stomach.                       -  Normal examined duodenum.                       - No specimens collected. Recommendation:       - soft food for 3 days, eat  slowly, chew well, take                        small bites Procedure Code(s):    --- Professional ---                       [phone], Esophagogastroduodenoscopy, flexible, transoral;                        with insertion of guide wire followed by passage of                        dilator(s) through esophagus over guide wire Diagnosis Code(s):    --- Professional ---                       K22.2, Esophageal obstruction                       K44.9, Diaphragmatic hernia without obstruction or                        gangrene                       R13.10, Dysphagia, unspecified CPT copyright 2016 [employer] Association. All rights reserved. The codes documented in this report are preliminary and upon coder review may  be revised to meet current compliance requirements. Manya Silvas, MD 06/13/2017 9:40:19 AM This report has been signed electronically. Number of Addenda: 0 Note Initiated On: 06/13/2017 9:28 AM      [facility]

## 2017-06-13 NOTE — Transfer of Care (Signed)
Immediate Anesthesia Transfer of Care Note  Patient: Sherri Leon  Procedure(s) Performed: Procedure(s): ESOPHAGOGASTRODUODENOSCOPY (EGD) WITH PROPOFOL (N/A) COLONOSCOPY WITH PROPOFOL (N/A)  Patient Location: PACU  Anesthesia Type:General  Level of Consciousness: sedated and responds to stimulation  Airway & Oxygen Therapy: Patient Spontanous Breathing and Patient connected to nasal cannula oxygen  Post-op Assessment: Report given to RN and Post -op Vital signs reviewed and stable  Post vital signs: Reviewed and stable  Last Vitals:  Vitals:   06/13/17 1004 06/13/17 1005  BP: (!) 123/52 (!) 123/52  Pulse:  (!) 52  Resp: 16 18  Temp: (!) 36.1 C     Last Pain:  Vitals:   06/13/17 1004  TempSrc: Tympanic         Complications: No apparent anesthesia complications

## 2017-06-16 ENCOUNTER — Encounter: Payer: Self-pay | Admitting: Unknown Physician Specialty

## 2017-06-17 NOTE — Anesthesia Postprocedure Evaluation (Signed)
Anesthesia Post Note  Patient: Sherri Leon  Procedure(s) Performed: Procedure(s) (LRB): ESOPHAGOGASTRODUODENOSCOPY (EGD) WITH PROPOFOL (N/A) COLONOSCOPY WITH PROPOFOL (N/A)  Patient location during evaluation: PACU Anesthesia Type: General Level of consciousness: awake and alert Pain management: pain level controlled Vital Signs Assessment: post-procedure vital signs reviewed and stable Respiratory status: spontaneous breathing, nonlabored ventilation, respiratory function stable and patient connected to nasal cannula oxygen Cardiovascular status: blood pressure returned to baseline and stable Postop Assessment: no signs of nausea or vomiting Anesthetic complications: no     Last Vitals:  Vitals:   06/13/17 1034 06/13/17 1054  BP: 134/83 (!) 159/64  Pulse: (!) 50 (!) 45  Resp: 15 14  Temp:      Last Pain:  Vitals:   06/16/17 0807  TempSrc:   PainSc: 0-No pain                 Molli Barrows

## 2017-06-26 ENCOUNTER — Other Ambulatory Visit: Payer: Self-pay | Admitting: Family Medicine

## 2017-06-26 DIAGNOSIS — R1319 Other dysphagia: Secondary | ICD-10-CM

## 2017-06-26 DIAGNOSIS — R1310 Dysphagia, unspecified: Secondary | ICD-10-CM

## 2017-06-26 MED ORDER — PANTOPRAZOLE SODIUM 40 MG PO TBEC
40.0000 mg | DELAYED_RELEASE_TABLET | Freq: Two times a day (BID) | ORAL | 11 refills | Status: DC
Start: 2017-06-26 — End: 2017-12-19

## 2017-07-09 ENCOUNTER — Other Ambulatory Visit: Payer: Self-pay | Admitting: Family Medicine

## 2017-07-09 MED ORDER — PRAVASTATIN SODIUM 40 MG PO TABS: ORAL_TABLET | ORAL | 1 refills | Status: DC

## 2017-07-14 ENCOUNTER — Encounter: Payer: Self-pay | Admitting: Family Medicine

## 2017-07-22 ENCOUNTER — Ambulatory Visit: Payer: Medicare Other | Admitting: Family Medicine

## 2017-07-30 ENCOUNTER — Encounter: Payer: Self-pay | Admitting: Family Medicine

## 2017-08-08 ENCOUNTER — Ambulatory Visit (INDEPENDENT_AMBULATORY_CARE_PROVIDER_SITE_OTHER): Payer: Medicare Other | Admitting: Family Medicine

## 2017-08-08 ENCOUNTER — Encounter: Payer: Self-pay | Admitting: Family Medicine

## 2017-08-08 VITALS — BP 118/78 | HR 60 | Temp 97.4°F | Resp 18 | Wt 162.0 lb

## 2017-08-08 DIAGNOSIS — Z23 Encounter for immunization: Secondary | ICD-10-CM | POA: Diagnosis not present

## 2017-08-08 DIAGNOSIS — E119 Type 2 diabetes mellitus without complications: Secondary | ICD-10-CM | POA: Diagnosis not present

## 2017-08-08 NOTE — Progress Notes (Signed)
Subjective:    Patient ID: Sherri Leon, female    DOB: 10-Mar-1936, 81 y.o.   MRN: [id number]  Medication Refill     11/2016 Patient is a very pleasant 81 year old white female here today to establish care. Last mammogram was in November 2016 and is therefore due. She had a bone density test in 2016 and this is up-to-date. She just had an eye exam and therefore this is up-to-date for 1 year. Based on her age does not require a Pap smear.  Also based on her age, I do not believe she requires a colonoscopy. Past medical history significant for diabetes mellitus type 2. She's taking metformin 500 mg a day. She states her fasting blood sugars in the morning are 130-140. Her two-hour postprandial blood sugars in the evening are less than 200 typically greater than 160. Otherwise she is doing well with no complications or concerns. She is due for Pneumovax 23. She's had Prevnar 13. She's had a shingles vaccine. She has had her flu shot. Diabetic foot exam is performed today is normal.  At that time, my plan was: Patient's blood pressures well controlled. My goal hemoglobin A1c for this patient would be less than 7. Based on her reported blood sugar values, I believe I will increase her metformin 500 mg twice a day. I will check hemoglobin A1c to confirm this suspicion. I'll also check a urine microalbumin. Diabetic foot exam is performed today. Diabetic eye exam is up-to-date. I will check a fasting lipid panel. Goal LDL cholesterol is less than 100. I will schedule the patient for mammogram. [place] test is up-to-date. She does not require a Pap smear or colonoscopy. She received Pneumovax 23 today. All other vaccines are up-to-date  04/18/17 Last hemoglobin A1c was 6.3. The patient is currently taking metformin twice a day. She denies any hypoglycemic episode but she is interested in possibly discontinuing the medication. She denies any polyuria, polydipsia, blurry vision. She denies any chest pain shortness of  breath or dyspnea on exertion. Her fasting blood sugars are less than 120.  AT that time, my plan was:  Check hemoglobin A1c. Hemoglobin A1c is less than 6.5, I believe it would be reasonable to discontinue metformin and monitor the sugars closely. I will be willing to except a hemoglobin A1c of 7 and a patient this age without concern.  08/08/17 Patient has remained off metformin. She is currently not taking any medication for her blood sugars. Her fasting sugars are typically less than 120 and her two-hour postprandial sugars are typically less than 180. She denies any chest pain shortness of breath or dyspnea on exertion. Recently was found to be anemic secondary to iron deficiency. Colonoscopy and EGD were negative for any source of GI bleed. Patient denies any melena or bright red blood per rectum. She is due to recheck a CBC however. She also is due for her flu shot Past Medical History:  Diagnosis Date  . Diabetes mellitus without complication ([place])   . Hyperlipidemia   . Hypertension   . Iron deficiency anemia    Past Surgical History:  Procedure Laterality Date  . ABDOMINAL HYSTERECTOMY    . ABDOMINAL HYSTERECTOMY  1970  . APPENDECTOMY  1970  . CARDIAC CATHETERIZATION N/A 08/23/2015   Procedure: Left Heart Cath;  Surgeon: Leonie Man, MD;  Location: [place] [facility];  Service: Cardiovascular;  Laterality: N/A;  . COLONOSCOPY WITH PROPOFOL N/A 06/13/2017   Procedure: COLONOSCOPY WITH PROPOFOL;  Surgeon: Vira Agar,  Gavin Pound, MD;  Location: [facility] ENDOSCOPY;  Service: Endoscopy;  Laterality: N/A;  . ESOPHAGOGASTRODUODENOSCOPY (EGD) WITH PROPOFOL N/A 06/13/2017   Procedure: ESOPHAGOGASTRODUODENOSCOPY (EGD) WITH PROPOFOL;  Surgeon: Manya Silvas, MD;  Location: [facility] ENDOSCOPY;  Service: Endoscopy;  Laterality: N/A;  . HERNIA REPAIR    . TONSILLECTOMY  1955   Current Outpatient Prescriptions on File Prior to Visit  Medication Sig Dispense Refill  . aspirin EC 81 MG tablet Take 81  mg by mouth every other day.     . Blood Glucose Monitoring Suppl ([place]) KIT 1 each by Does not apply route once. Dx E11.9 1 each 0  . GARLIC PO Take by mouth.    Marland Kitchen glucose blood (TRUETRACK TEST) test strip 1 each by Other route 2 (two) times daily. Check blood sugar twice daily. 100 each 11  . losartan (COZAAR) 100 MG tablet Take 1 tablet (100 mg total) by mouth daily. 90 tablet 3  . Multiple Vitamins-Minerals (MULTIVITAMIN ADULT PO) Take by mouth.    . pantoprazole (PROTONIX) 40 MG tablet Take 1 tablet (40 mg total) by mouth 2 (two) times daily. 60 tablet 11  . pravastatin (PRAVACHOL) 40 MG tablet TAKE ONE (1) TABLET EACH DAY 90 tablet 1  . metFORMIN (GLUCOPHAGE) 500 MG tablet TAKE ONE TABLET TWICE DAILY WITH MEALS (Patient not taking: Reported on 08/08/2017) 180 tablet 1   No current facility-administered medications on file prior to visit.    No Known Allergies Social History   Social History  . Marital status: Married    Spouse name: N/A  . Number of children: N/A  . Years of education: N/A   Occupational History  . Not on file.   Social History Main Topics  . Smoking status: Never Smoker  . Smokeless tobacco: Never Used  . Alcohol use No  . Drug use: No  . Sexual activity: Not Currently   Other Topics Concern  . Not on file   Social History Narrative   ** Merged History Encounter **       Family History  Problem Relation Age of Onset  . Hypertension Mother       Review of Systems  All other systems reviewed and are negative.      Objective:   Physical Exam  Constitutional: She is oriented to person, place, and time. She appears well-developed and well-nourished. No distress.  HENT:  Head: Normocephalic and atraumatic.  Right Ear: External ear normal.  Left Ear: External ear normal.  Nose: Nose normal.  Mouth/Throat: Oropharynx is clear and moist. No oropharyngeal exudate.  Eyes: Pupils are equal, round, and reactive to light.  Conjunctivae and EOM are normal. Right eye exhibits no discharge. Left eye exhibits no discharge. No scleral icterus.  Neck: Normal range of motion. Neck supple. No JVD present. No tracheal deviation present. No thyromegaly present.  Cardiovascular: Normal rate, regular rhythm, normal heart sounds and intact distal pulses.  Exam reveals no gallop and no friction rub.   No murmur heard. Pulmonary/Chest: Effort normal and breath sounds normal. No stridor. No respiratory distress. She has no wheezes. She has no rales. She exhibits no tenderness.  Abdominal: Soft. Bowel sounds are normal. She exhibits no distension and no mass. There is no tenderness. There is no rebound and no guarding.  Musculoskeletal: Normal range of motion. She exhibits no edema, tenderness or deformity.  Lymphadenopathy:    She has no cervical adenopathy.  Neurological: She is alert and oriented to person, place,  and time. She displays normal reflexes. No cranial nerve deficit. She exhibits normal muscle tone. Coordination normal.  Skin: Skin is warm. No rash noted. She is not diaphoretic. No erythema. No pallor.  Psychiatric: She has a normal mood and affect. Her behavior is normal. Judgment and thought content normal.  Vitals reviewed.         Assessment & Plan:  Need for prophylactic vaccination and inoculation against influenza - Plan: Flu vaccine HIGH DOSE PF (Fluzone High dose)  Controlled type 2 diabetes mellitus without complication, without long-term current use of insulin ([place]) - Plan: CBC with Differential/Platelet, COMPLETE METABOLIC PANEL WITH GFR, Hemoglobin A1c I will recheck the patient's hemoglobin A1c today. Goal hemoglobin A1c is less than 7. If greater than 7, I would recommend that she resume metformin. I will also recheck a CBC to ensure her anemia stabilized or improved. She received her flu shot today.

## 2017-08-09 LAB — COMPLETE METABOLIC PANEL WITH GFR
AG Ratio: 1.7 (calc) (ref 1.0–2.5)
ALBUMIN MSPROF: 3.9 g/dL (ref 3.6–5.1)
ALKALINE PHOSPHATASE (APISO): 78 U/L (ref 33–130)
ALT: 13 U/L (ref 6–29)
AST: 18 U/L (ref 10–35)
BILIRUBIN TOTAL: 0.6 mg/dL (ref 0.2–1.2)
BUN: 11 mg/dL (ref 7–25)
CHLORIDE: 106 mmol/L (ref 98–110)
CO2: 27 mmol/L (ref 20–32)
CREATININE: 0.65 mg/dL (ref 0.60–0.93)
Calcium: 9.3 mg/dL (ref 8.6–10.4)
GFR, Est African American: 98 mL/min/{1.73_m2} (ref >=60)
GFR, Est Non African American: 84 mL/min/{1.73_m2} (ref >=60)
GLOBULIN: 2.3 g/dL (ref 1.9–3.7)
GLUCOSE: 109 mg/dL — AB (ref 65–99)
Potassium: 4.4 mmol/L (ref 3.5–5.3)
SODIUM: 139 mmol/L (ref 135–146)
Total Protein: 6.2 g/dL (ref 6.1–8.1)

## 2017-08-09 LAB — CBC WITH DIFFERENTIAL/PLATELET
BASOS ABS: 29 {cells}/uL (ref 0–200)
Basophils Relative: 0.6 %
EOS ABS: 108 {cells}/uL (ref 15–500)
EOS PCT: 2.2 %
HEMATOCRIT: 38.1 % (ref 35.0–45.0)
Hemoglobin: 12.5 g/dL (ref 11.7–15.5)
Lymphs Abs: 2210 cells/uL (ref 850–3900)
MCH: 27.8 pg (ref 27.0–33.0)
MCHC: 32.8 g/dL (ref 32.0–36.0)
MCV: 84.9 fL (ref 80.0–100.0)
MONOS PCT: 6.7 %
MPV: 12.2 fL (ref 7.5–12.5)
NEUTROS PCT: 45.4 %
Neutro Abs: 2225 cells/uL (ref 1500–7800)
Platelets: 185 10*3/uL (ref 140–400)
RBC: 4.49 10*6/uL (ref 3.80–5.10)
RDW: 13.6 % (ref 11.0–15.0)
Total Lymphocyte: 45.1 %
WBC mixed population: 328 cells/uL (ref 200–950)
WBC: 4.9 10*3/uL (ref 3.8–10.8)

## 2017-08-09 LAB — HEMOGLOBIN A1C
Hgb A1c MFr Bld: 5.8 % of total Hgb — ABNORMAL HIGH (ref <5.7)
Mean Plasma Glucose: 120 (calc)
eAG (mmol/L): 6.6 (calc)

## 2017-09-02 ENCOUNTER — Other Ambulatory Visit: Payer: Self-pay

## 2017-09-02 DIAGNOSIS — I10 Essential (primary) hypertension: Secondary | ICD-10-CM

## 2017-09-02 MED ORDER — LOSARTAN POTASSIUM 100 MG PO TABS: 100.0000 mg | ORAL_TABLET | Freq: Every day | ORAL | 3 refills | Status: DC

## 2017-09-02 NOTE — Telephone Encounter (Signed)
Written by different provider Berenice Bouton jr ok to refill?

## 2017-09-02 NOTE — Telephone Encounter (Signed)
Ok to refill 

## 2017-09-02 NOTE — Telephone Encounter (Signed)
rx filled

## 2017-09-23 DIAGNOSIS — Z961 Presence of intraocular lens: Secondary | ICD-10-CM | POA: Diagnosis not present

## 2017-09-23 LAB — HM DIABETES EYE EXAM

## 2017-11-17 ENCOUNTER — Other Ambulatory Visit: Payer: Self-pay

## 2017-11-17 ENCOUNTER — Encounter: Payer: Self-pay | Admitting: Physician Assistant

## 2017-11-17 ENCOUNTER — Ambulatory Visit (INDEPENDENT_AMBULATORY_CARE_PROVIDER_SITE_OTHER): Payer: Medicare Other | Admitting: Physician Assistant

## 2017-11-17 VITALS — BP 122/80 | HR 68 | Temp 97.8°F | Resp 14 | Wt 167.8 lb

## 2017-11-17 DIAGNOSIS — J988 Other specified respiratory disorders: Secondary | ICD-10-CM | POA: Diagnosis not present

## 2017-11-17 DIAGNOSIS — R8290 Unspecified abnormal findings in urine: Secondary | ICD-10-CM

## 2017-11-17 DIAGNOSIS — B9689 Other specified bacterial agents as the cause of diseases classified elsewhere: Secondary | ICD-10-CM

## 2017-11-17 LAB — URINALYSIS, ROUTINE W REFLEX MICROSCOPIC
Bilirubin Urine: NEGATIVE
Glucose, UA: NEGATIVE
HGB URINE DIPSTICK: NEGATIVE
Ketones, ur: NEGATIVE
LEUKOCYTES UA: NEGATIVE
NITRITE: NEGATIVE
PROTEIN: NEGATIVE
Specific Gravity, Urine: 1.02 (ref 1.001–1.03)
pH: 6 (ref 5.0–8.0)

## 2017-11-17 MED ORDER — HYDROCODONE-HOMATROPINE 5-1.5 MG/5ML PO SYRP: 5.0000 mL | ORAL_SOLUTION | Freq: Four times a day (QID) | ORAL | 0 refills | Status: DC | PRN

## 2017-11-17 MED ORDER — AZITHROMYCIN 250 MG PO TABS: ORAL_TABLET | ORAL | 0 refills | Status: DC

## 2017-11-17 NOTE — Progress Notes (Signed)
Patient ID: Sherri Leon MRN: [id number], DOB: May 25, 1936, 81 y.o. Date of Encounter: 11/17/2017, 10:18 AM    Chief Complaint:  Chief Complaint  Patient presents with  . Cough  . urine odor     HPI: 81 y.o. year old female presents with above.   She reports that she had "cold" symptoms for about 6 days.  Then has also developed cough and has been having more and more thick dark phlegm the last several days.  Also has had some congestion and mucus in her head and nose.  Getting very little sleep secondary to cough.  No known fevers or chills.  No significant sore throat.  Also has noticed some odor with urination.  Having some urinary frequency and urgency.  Having no dysuria.  No pain or burning with urination.  No abdominal or pelvic pain.    Home Meds:   Outpatient Medications Prior to Visit  Medication Sig Dispense Refill  . aspirin EC 81 MG tablet Take 81 mg by mouth every other day.     . Blood Glucose Monitoring Suppl ([place]) KIT 1 each by Does not apply route once. Dx E11.9 1 each 0  . GARLIC PO Take by mouth.    Marland Kitchen glucose blood (TRUETRACK TEST) test strip 1 each by Other route 2 (two) times daily. Check blood sugar twice daily. 100 each 11  . losartan (COZAAR) 100 MG tablet Take 1 tablet (100 mg total) by mouth daily. 90 tablet 3  . metFORMIN (GLUCOPHAGE) 500 MG tablet TAKE ONE TABLET TWICE DAILY WITH MEALS 180 tablet 1  . Multiple Vitamins-Minerals (MULTIVITAMIN ADULT PO) Take by mouth.    . pantoprazole (PROTONIX) 40 MG tablet Take 1 tablet (40 mg total) by mouth 2 (two) times daily. 60 tablet 11  . pravastatin (PRAVACHOL) 40 MG tablet TAKE ONE (1) TABLET EACH DAY 90 tablet 1   No facility-administered medications prior to visit.     Allergies: No Known Allergies    Review of Systems: See HPI for pertinent ROS. All other ROS negative.    Physical Exam: Blood pressure 122/80, pulse 68, temperature 97.8 F (36.6 C), temperature source Oral,  resp. rate 14, weight 76.1 kg (167 lb 12.8 oz), SpO2 98 %., Body mass index is 26.28 kg/m. General:  WNWD WF. Appears in no acute distress. HEENT: Normocephalic, atraumatic, eyes without discharge, sclera non-icteric, nares are without discharge. Bilateral auditory canals clear, TM's are without perforation, pearly grey and translucent with reflective cone of light bilaterally. Oral cavity moist, posterior pharynx without exudate, erythema, peritonsillar abscess.  Neck: Supple. No thyromegaly. No lymphadenopathy. Lungs: Clear bilaterally to auscultation without wheezes, rales, or rhonchi. Breathing is unlabored. Heart: Regular rhythm. No murmurs, rubs, or gallops. Msk:  Strength and tone normal for age. Extremities/Skin: Warm and dry.  Neuro: Alert and oriented X 3. Moves all extremities spontaneously. Gait is normal. CNII-XII grossly in tact. Psych:  Responds to questions appropriately with a normal affect.     ASSESSMENT AND PLAN:  81 y.o. year old female with  1. Bacterial respiratory infection She is to take antibiotic as directed and complete all of it.  In the interim can use Hycodan as cough suppressant for symptom relief in the interim.  Follow-up if symptoms do not resolve within 1 week after completion of antibiotic. - azithromycin (ZITHROMAX) 250 MG tablet; Day 1: Take 2 daily. Days 2 -5: Take 1 daily.  Dispense: 6 tablet; Refill: 0 - HYDROcodone-homatropine (HYCODAN) 5-1.5  MG/5ML syrup; Take 5 mLs by mouth every 6 (six) hours as needed for cough.  Dispense: 120 mL; Refill: 0  2. Abnormal urine odor Reassured her that urinalysis shows no sign of UTI. - Urinalysis, Routine w reflex microscopic Results for orders placed or performed in visit on 11/17/17  Urinalysis, Routine w reflex microscopic  Result Value Ref Range   Color, Urine YELLOW YELLOW   APPearance CLEAR CLEAR   Specific Gravity, Urine 1.020 1.001 - 1.03   pH 6.0 5.0 - 8.0   Glucose, UA NEGATIVE NEGATIVE    Bilirubin Urine NEGATIVE NEGATIVE   Ketones, ur NEGATIVE NEGATIVE   Hgb urine dipstick NEGATIVE NEGATIVE   Protein, ur NEGATIVE NEGATIVE   Nitrite NEGATIVE NEGATIVE   Leukocytes, UA NEGATIVE NEGATIVE     Signed, [address] [place], [place], [facility] 11/17/2017 10:18 AM

## 2017-11-24 ENCOUNTER — Other Ambulatory Visit: Payer: Self-pay | Admitting: Family Medicine

## 2017-12-09 ENCOUNTER — Encounter: Payer: Self-pay | Admitting: Family Medicine

## 2017-12-09 ENCOUNTER — Ambulatory Visit (INDEPENDENT_AMBULATORY_CARE_PROVIDER_SITE_OTHER): Payer: Medicare Other | Admitting: Family Medicine

## 2017-12-09 VITALS — BP 118/76 | HR 62 | Temp 98.6°F | Resp 16 | Ht 63.0 in | Wt 169.0 lb

## 2017-12-09 DIAGNOSIS — R079 Chest pain, unspecified: Secondary | ICD-10-CM | POA: Diagnosis not present

## 2017-12-09 MED ORDER — DILTIAZEM HCL 30 MG PO TABS: 30.0000 mg | ORAL_TABLET | Freq: Three times a day (TID) | ORAL | 0 refills | Status: DC

## 2017-12-09 NOTE — Progress Notes (Signed)
Subjective:    Patient ID: Sherri Leon, female    DOB: [birth year], 82 y.o.   MRN: [id number]  HPI  Patient is a 82 year old white female who had a catheterization in 2016 that showed clear coronary arteries with no evidence of coronary artery disease. She also had an EGD performed in July of last year that showed Schlatzki's ring but no significant abnormalities. Over the last 2-3 months, she is having episodes of chest pain. It occurs after she drinks coffee or eats chocolate despite taking Protonix 40 mg twice daily.  She walks a mile everyday backwards and forth to her mailbox with no chest pain and no shortness of breath. This always seems to occur after eating. She also reports reflux associated with the pain. She denies any hemoptysis. She denies any pleurisy. She denies any cough. She denies any melena or hematochezia. EKG today shows normal sinus rhythm with no evidence of ischemia or infarction. Past Medical History:  Diagnosis Date  . Diabetes mellitus without complication ([place])   . Hyperlipidemia   . Hypertension   . Iron deficiency anemia    Past Surgical History:  Procedure Laterality Date  . ABDOMINAL HYSTERECTOMY    . ABDOMINAL HYSTERECTOMY  1970  . APPENDECTOMY  1970  . CARDIAC CATHETERIZATION N/A 08/23/2015   Procedure: Left Heart Cath;  Surgeon: Leonie Man, MD;  Location: [place] [facility];  Service: Cardiovascular;  Laterality: N/A;  . COLONOSCOPY WITH PROPOFOL N/A 06/13/2017   Procedure: COLONOSCOPY WITH PROPOFOL;  Surgeon: Manya Silvas, MD;  Location: [facility] ENDOSCOPY;  Service: Endoscopy;  Laterality: N/A;  . ESOPHAGOGASTRODUODENOSCOPY (EGD) WITH PROPOFOL N/A 06/13/2017   Procedure: ESOPHAGOGASTRODUODENOSCOPY (EGD) WITH PROPOFOL;  Surgeon: Manya Silvas, MD;  Location: [facility] ENDOSCOPY;  Service: Endoscopy;  Laterality: N/A;  . HERNIA REPAIR    . TONSILLECTOMY  1955   Current Outpatient Medications on File Prior to Visit  Medication Sig Dispense  Refill  . aspirin EC 81 MG tablet Take 81 mg by mouth every other day.     . Blood Glucose Monitoring Suppl ([place]) KIT 1 each by Does not apply route once. Dx E11.9 1 each 0  . GARLIC PO Take by mouth.    Marland Kitchen glucose blood (TRUETRACK TEST) test strip 1 each by Other route 2 (two) times daily. Check blood sugar twice daily. 100 each 11  . losartan (COZAAR) 100 MG tablet Take 1 tablet (100 mg total) by mouth daily. 90 tablet 3  . metFORMIN (GLUCOPHAGE) 500 MG tablet TAKE ONE TABLET TWICE DAILY WITH MEALS 180 tablet 1  . Multiple Vitamins-Minerals (MULTIVITAMIN ADULT PO) Take by mouth.    . pantoprazole (PROTONIX) 40 MG tablet Take 1 tablet (40 mg total) by mouth 2 (two) times daily. 60 tablet 11  . pravastatin (PRAVACHOL) 40 MG tablet TAKE ONE (1) TABLET EACH DAY 90 tablet 0   No current facility-administered medications on file prior to visit.    No Known Allergies Social History   Socioeconomic History  . Marital status: Married    Spouse name: Not on file  . Number of children: Not on file  . Years of education: Not on file  . Highest education level: Not on file  Social Needs  . Financial resource strain: Not on file  . Food insecurity - worry: Not on file  . Food insecurity - inability: Not on file  . Transportation needs - medical: Not on file  . Transportation needs - non-medical: Not  on file  Occupational History  . Not on file  Tobacco Use  . Smoking status: Never Smoker  . Smokeless tobacco: Never Used  Substance and Sexual Activity  . Alcohol use: No  . Drug use: No  . Sexual activity: Not Currently  Other Topics Concern  . Not on file  Social History Narrative   ** Merged History Encounter **         Review of Systems  All other systems reviewed and are negative.      Objective:   Physical Exam  Constitutional: She appears well-developed and well-nourished. No distress.  Neck: Neck supple. No JVD present. No thyromegaly present.    Cardiovascular: Normal rate, regular rhythm and normal heart sounds.  Pulmonary/Chest: Effort normal and breath sounds normal. No respiratory distress. She has no wheezes. She has no rales.  Abdominal: Soft. Bowel sounds are normal. She exhibits no distension. There is no tenderness. There is no rebound and no guarding.  Musculoskeletal: She exhibits no edema.  Lymphadenopathy:    She has no cervical adenopathy.  Skin: She is not diaphoretic.  Vitals reviewed.         Assessment & Plan:  Chest pain, unspecified type - Plan: EKG 12-Lead  Given the fact she has normal EKG, atypical chest pain, and a clean catheterization 2 years ago, I do not believe this chest pain is due to coronary artery disease. Furthermore his been ongoing for the last 2-3 months which would be unlikely coronary artery disease. I believe this is esophageal in origin. Differential diagnosis includes GERD versus esophageal spasms. We will temporarily discontinue losartan and replaced with Cardizem 30 mg 3 times a day for esophageal spasms and we will discontinue pantoprazole and replaced with dexilant 60 mg poqday and recheck in 2 weeks or sooner if worse.

## 2017-12-17 DIAGNOSIS — R928 Other abnormal and inconclusive findings on diagnostic imaging of breast: Secondary | ICD-10-CM | POA: Diagnosis not present

## 2017-12-17 DIAGNOSIS — Z1231 Encounter for screening mammogram for malignant neoplasm of breast: Secondary | ICD-10-CM | POA: Diagnosis not present

## 2017-12-19 ENCOUNTER — Other Ambulatory Visit: Payer: Self-pay | Admitting: Family Medicine

## 2017-12-19 ENCOUNTER — Ambulatory Visit (INDEPENDENT_AMBULATORY_CARE_PROVIDER_SITE_OTHER): Payer: Medicare Other | Admitting: Family Medicine

## 2017-12-19 ENCOUNTER — Encounter: Payer: Self-pay | Admitting: Family Medicine

## 2017-12-19 VITALS — BP 130/78 | HR 64 | Temp 97.4°F | Resp 14 | Ht 63.0 in | Wt 171.0 lb

## 2017-12-19 DIAGNOSIS — R1310 Dysphagia, unspecified: Secondary | ICD-10-CM

## 2017-12-19 DIAGNOSIS — R1013 Epigastric pain: Secondary | ICD-10-CM

## 2017-12-19 DIAGNOSIS — R1319 Other dysphagia: Secondary | ICD-10-CM

## 2017-12-19 MED ORDER — SUCRALFATE 1 G PO TABS
1.0000 g | ORAL_TABLET | Freq: Three times a day (TID) | ORAL | 1 refills | Status: DC
Start: 2017-12-19 — End: 2018-01-16

## 2017-12-19 NOTE — Progress Notes (Signed)
Subjective:    Patient ID: Sherri Leon, female    DOB: 12-06-1935, 82 y.o.   MRN: [id number]  HPI 12/09/17 Patient is a 82 year old white female who had a catheterization in 2016 that showed clear coronary arteries with no evidence of coronary artery disease. She also had an EGD performed in July of last year that showed Schlatzki's ring but no significant abnormalities. Over the last 2-3 months, she is having episodes of chest pain. It occurs after she drinks coffee or eats chocolate despite taking Protonix 40 mg twice daily.  She walks a mile everyday backwards and forth to her mailbox with no chest pain and no shortness of breath. This always seems to occur after eating. She also reports reflux associated with the pain. She denies any hemoptysis. She denies any pleurisy. She denies any cough. She denies any melena or hematochezia. EKG today shows normal sinus rhythm with no evidence of ischemia or infarction.  At that time, my plan was: Given the fact she has normal EKG, atypical chest pain, and a clean catheterization 2 years ago, I do not believe this chest pain is due to coronary artery disease. Furthermore his been ongoing for the last 2-3 months which would be unlikely coronary artery disease. I believe this is esophageal in origin. Differential diagnosis includes GERD versus esophageal spasms. We will temporarily discontinue losartan and replaced with Cardizem 30 mg 3 times a day for esophageal spasms and we will discontinue pantoprazole and replaced with dexilant 60 mg poqday and recheck in 2 weeks or sooner if worse.    12/19/17 She has seen no improvement we made at her last visit.  She continues to complain of a pain right below the xiphoid process whenever she eats.  Chocolate seems to be the worse offending agent.  She denies any angina or chest pain with exertion or shortness of breath with exertion.  She denies any melena or hematochezia.  There is no tenderness to palpation in her  abdomen.  She does not drink alcohol.  She has no history of pancreatitis.  She also reports a "knot" forms in her throat when she eats too much food which I equate to an esophageal spasm but the diltiazem has not helped. Past Medical History:  Diagnosis Date  . Diabetes mellitus without complication ([place])   . Hyperlipidemia   . Hypertension   . Iron deficiency anemia    Past Surgical History:  Procedure Laterality Date  . ABDOMINAL HYSTERECTOMY    . ABDOMINAL HYSTERECTOMY  1970  . APPENDECTOMY  1970  . CARDIAC CATHETERIZATION N/A 08/23/2015   Procedure: Left Heart Cath;  Surgeon: Leonie Man, MD;  Location: [place] [facility];  Service: Cardiovascular;  Laterality: N/A;  . COLONOSCOPY WITH PROPOFOL N/A 06/13/2017   Procedure: COLONOSCOPY WITH PROPOFOL;  Surgeon: Manya Silvas, MD;  Location: [facility] ENDOSCOPY;  Service: Endoscopy;  Laterality: N/A;  . ESOPHAGOGASTRODUODENOSCOPY (EGD) WITH PROPOFOL N/A 06/13/2017   Procedure: ESOPHAGOGASTRODUODENOSCOPY (EGD) WITH PROPOFOL;  Surgeon: Manya Silvas, MD;  Location: [facility] ENDOSCOPY;  Service: Endoscopy;  Laterality: N/A;  . HERNIA REPAIR    . TONSILLECTOMY  1955   Current Outpatient Medications on File Prior to Visit  Medication Sig Dispense Refill  . aspirin EC 81 MG tablet Take 81 mg by mouth every other day.     . Blood Glucose Monitoring Suppl ([place]) KIT 1 each by Does not apply route once. Dx E11.9 1 each 0  . diltiazem (CARDIZEM)  30 MG tablet Take 1 tablet (30 mg total) by mouth 3 (three) times daily. 90 tablet 0  . GARLIC PO Take by mouth.    Marland Kitchen glucose blood (TRUETRACK TEST) test strip 1 each by Other route 2 (two) times daily. Check blood sugar twice daily. 100 each 11  . Multiple Vitamins-Minerals (MULTIVITAMIN ADULT PO) Take by mouth.    . pantoprazole (PROTONIX) 40 MG tablet Take 1 tablet (40 mg total) by mouth 2 (two) times daily. 60 tablet 11  . pravastatin (PRAVACHOL) 40 MG tablet TAKE ONE (1) TABLET  EACH DAY 90 tablet 0   No current facility-administered medications on file prior to visit.    No Known Allergies Social History   Socioeconomic History  . Marital status: Married    Spouse name: Not on file  . Number of children: Not on file  . Years of education: Not on file  . Highest education level: Not on file  Social Needs  . Financial resource strain: Not on file  . Food insecurity - worry: Not on file  . Food insecurity - inability: Not on file  . Transportation needs - medical: Not on file  . Transportation needs - non-medical: Not on file  Occupational History  . Not on file  Tobacco Use  . Smoking status: Never Smoker  . Smokeless tobacco: Never Used  Substance and Sexual Activity  . Alcohol use: No  . Drug use: No  . Sexual activity: Not Currently  Other Topics Concern  . Not on file  Social History Narrative   ** Merged History Encounter **         Review of Systems  All other systems reviewed and are negative.      Objective:   Physical Exam  Constitutional: She appears well-developed and well-nourished. No distress.  Neck: Neck supple. No JVD present. No thyromegaly present.  Cardiovascular: Normal rate, regular rhythm and normal heart sounds.  Pulmonary/Chest: Effort normal and breath sounds normal. No respiratory distress. She has no wheezes. She has no rales.  Abdominal: Soft. Bowel sounds are normal. She exhibits no distension. There is no tenderness. There is no rebound and no guarding.  Musculoskeletal: She exhibits no edema.  Lymphadenopathy:    She has no cervical adenopathy.  Skin: She is not diaphoretic.  Vitals reviewed.         Assessment & Plan:  Dyspepsia Differential diagnosis continues to include esophageal spasm versus peptic ulcer disease versus hiatal hernia.  Discontinue diltiazem as it has not provided any benefit.  Resume losartan 100 mg a day for hypertension.  Continue Dexilant 60 mg a day and add Carafate 1 g p.o.  q. before meals at bedtime and reassess in 1 week.  If no improvement is seen, consult GI.  Patient may benefit from a barium swallow to evaluate for corkscrew esophagus.  Would also consider imaging of the abdomen to rule out neoplastic process in the abdomen as EGD and colonoscopy were negative last year. Symptoms due not sound consitant with gastroparesis.

## 2018-01-02 ENCOUNTER — Other Ambulatory Visit: Payer: Self-pay | Admitting: Family Medicine

## 2018-01-02 MED ORDER — DEXLANSOPRAZOLE 60 MG PO CPDR: 60.0000 mg | DELAYED_RELEASE_CAPSULE | Freq: Every day | ORAL | 11 refills | Status: DC

## 2018-01-16 ENCOUNTER — Ambulatory Visit (INDEPENDENT_AMBULATORY_CARE_PROVIDER_SITE_OTHER): Payer: Medicare Other | Admitting: Family Medicine

## 2018-01-16 ENCOUNTER — Encounter: Payer: Self-pay | Admitting: Family Medicine

## 2018-01-16 VITALS — BP 142/80 | HR 68 | Temp 97.2°F | Resp 16 | Ht 63.0 in | Wt 171.0 lb

## 2018-01-16 DIAGNOSIS — E119 Type 2 diabetes mellitus without complications: Secondary | ICD-10-CM

## 2018-01-16 DIAGNOSIS — R1310 Dysphagia, unspecified: Secondary | ICD-10-CM

## 2018-01-16 DIAGNOSIS — I10 Essential (primary) hypertension: Secondary | ICD-10-CM

## 2018-01-16 DIAGNOSIS — R1319 Other dysphagia: Secondary | ICD-10-CM

## 2018-01-16 DIAGNOSIS — R1013 Epigastric pain: Secondary | ICD-10-CM

## 2018-01-16 DIAGNOSIS — E7800 Pure hypercholesterolemia, unspecified: Secondary | ICD-10-CM

## 2018-01-16 NOTE — Progress Notes (Signed)
Subjective:    Patient ID: Sherri Leon, female    DOB: 1936-04-01, 82 y.o.   MRN: [id number]  HPI 12/09/17 Patient is a 82 year old white female who had a catheterization in 2016 that showed clear coronary arteries with no evidence of coronary artery disease. She also had an EGD performed in July of last year that showed Schlatzki's ring but no significant abnormalities. Over the last 2-3 months, she is having episodes of chest pain. It occurs after she drinks coffee or eats chocolate despite taking Protonix 40 mg twice daily.  She walks a mile everyday backwards and forth to her mailbox with no chest pain and no shortness of breath. This always seems to occur after eating. She also reports reflux associated with the pain. She denies any hemoptysis. She denies any pleurisy. She denies any cough. She denies any melena or hematochezia. EKG today shows normal sinus rhythm with no evidence of ischemia or infarction.  At that time, my plan was: Given the fact she has normal EKG, atypical chest pain, and a clean catheterization 2 years ago, I do not believe this chest pain is due to coronary artery disease. Furthermore his been ongoing for the last 2-3 months which would be unlikely coronary artery disease. I believe this is esophageal in origin. Differential diagnosis includes GERD versus esophageal spasms. We will temporarily discontinue losartan and replaced with Cardizem 30 mg 3 times a day for esophageal spasms and we will discontinue pantoprazole and replaced with dexilant 60 mg poqday and recheck in 2 weeks or sooner if worse.    12/19/17 She has seen no improvement we made at her last visit.  She continues to complain of a pain right below the xiphoid process whenever she eats.  Chocolate seems to be the worse offending agent.  She denies any angina or chest pain with exertion or shortness of breath with exertion.  She denies any melena or hematochezia.  There is no tenderness to palpation in her  abdomen.  She does not drink alcohol.  She has no history of pancreatitis.  She also reports a "knot" forms in her throat when she eats too much food which I equate to an esophageal spasm but the diltiazem has not helped.  At that time, my plan was: Differential diagnosis continues to include esophageal spasm versus peptic ulcer disease versus hiatal hernia.  Discontinue diltiazem as it has not provided any benefit.  Resume losartan 100 mg a day for hypertension.  Continue Dexilant 60 mg a day and add Carafate 1 g p.o. q. before meals at bedtime and reassess in 1 week.  If no improvement is seen, consult GI.  Patient may benefit from a barium swallow to evaluate for corkscrew esophagus.  Would also consider imaging of the abdomen to rule out neoplastic process in the abdomen as EGD and colonoscopy were negative last year. Symptoms due not sound consitant with gastroparesis.  01/16/18 Patient states that she felt better on Dexilant.  She never took Carafate but her symptoms improved on the Dexilant.  Unfortunately when she went to fill the prescription for this it was going to be over $100 and she cannot afford that.  Therefore she has resumed her previous proton pump inhibitor.  She states that she is no longer having dyspepsia.  She denies any chest pain.  She denies any angina.  She denies any shortness of breath or dyspnea on exertion.  Her symptoms have improved.  She is due today to recheck her  fasting lab work regarding her diabetes.  She denies any polyuria, polydipsia, or blurry vision.  She sees an eye doctor in [place] every 6 months.  Her eye exam is up-to-date we just do not have records.  Diabetic foot exam is performed today and is normal aside from thick dystrophic toenails on both great toes bilaterally.  Blood pressure today is acceptable given her age at 142/80.  Past Medical History:  Diagnosis Date  . Diabetes mellitus without complication ([place])   . Hyperlipidemia   . Hypertension   .  Iron deficiency anemia    Past Surgical History:  Procedure Laterality Date  . ABDOMINAL HYSTERECTOMY    . ABDOMINAL HYSTERECTOMY  1970  . APPENDECTOMY  1970  . CARDIAC CATHETERIZATION N/A 08/23/2015   Procedure: Left Heart Cath;  Surgeon: Leonie Man, MD;  Location: [place] [facility];  Service: Cardiovascular;  Laterality: N/A;  . COLONOSCOPY WITH PROPOFOL N/A 06/13/2017   Procedure: COLONOSCOPY WITH PROPOFOL;  Surgeon: Manya Silvas, MD;  Location: [facility] ENDOSCOPY;  Service: Endoscopy;  Laterality: N/A;  . ESOPHAGOGASTRODUODENOSCOPY (EGD) WITH PROPOFOL N/A 06/13/2017   Procedure: ESOPHAGOGASTRODUODENOSCOPY (EGD) WITH PROPOFOL;  Surgeon: Manya Silvas, MD;  Location: [facility] ENDOSCOPY;  Service: Endoscopy;  Laterality: N/A;  . HERNIA REPAIR    . TONSILLECTOMY  1955   Current Outpatient Medications on File Prior to Visit  Medication Sig Dispense Refill  . aspirin EC 81 MG tablet Take 81 mg by mouth every other day.     . Blood Glucose Monitoring Suppl ([place]) KIT 1 each by Does not apply route once. Dx E11.9 1 each 0  . GARLIC PO Take by mouth.    Marland Kitchen glucose blood (TRUETRACK TEST) test strip 1 each by Other route 2 (two) times daily. Check blood sugar twice daily. 100 each 11  . losartan (COZAAR) 100 MG tablet Take 100 mg by mouth daily.    . Multiple Vitamins-Minerals (MULTIVITAMIN ADULT PO) Take by mouth.    . pantoprazole (PROTONIX) 40 MG tablet Take 40 mg by mouth daily.    . pravastatin (PRAVACHOL) 40 MG tablet TAKE ONE (1) TABLET EACH DAY 90 tablet 0   No current facility-administered medications on file prior to visit.    No Known Allergies Social History   Socioeconomic History  . Marital status: Married    Spouse name: Not on file  . Number of children: Not on file  . Years of education: Not on file  . Highest education level: Not on file  Social Needs  . Financial resource strain: Not on file  . Food insecurity - worry: Not on file  . Food  insecurity - inability: Not on file  . Transportation needs - medical: Not on file  . Transportation needs - non-medical: Not on file  Occupational History  . Not on file  Tobacco Use  . Smoking status: Never Smoker  . Smokeless tobacco: Never Used  Substance and Sexual Activity  . Alcohol use: No  . Drug use: No  . Sexual activity: Not Currently  Other Topics Concern  . Not on file  Social History Narrative   ** Merged History Encounter **         Review of Systems  All other systems reviewed and are negative.      Objective:   Physical Exam  Constitutional: She appears well-developed and well-nourished. No distress.  Neck: Neck supple. No JVD present. No thyromegaly present.  Cardiovascular: Normal rate, regular rhythm  and normal heart sounds.  Pulmonary/Chest: Effort normal and breath sounds normal. No respiratory distress. She has no wheezes. She has no rales.  Abdominal: Soft. Bowel sounds are normal. She exhibits no distension. There is no tenderness. There is no rebound and no guarding.  Musculoskeletal: She exhibits no edema.  Lymphadenopathy:    She has no cervical adenopathy.  Skin: She is not diaphoretic.  Vitals reviewed.         Assessment & Plan:  Dyspepsia  Esophageal dysphagia  Controlled type 2 diabetes mellitus without complication, without long-term current use of insulin (HCC) - Plan: COMPLETE METABOLIC PANEL WITH GFR, Lipid panel, Microalbumin, urine, Hemoglobin A1c  Benign essential HTN  Pure hypercholesterolemia  Patient's original dyspepsia has improved.  No further treatment or workup at this time.  Diabetic eye exam is up-to-date.  I would like to obtain records of this.  Diabetic foot exam was performed today and is normal aside from thick toenails.  Blood pressures acceptable.  Check hemoglobin A1c.  Check fasting lipid panel.  Check urine microalbumin.  Goal LDL cholesterol is less than 100.  Goal hemoglobin A1c is less than 6.5.

## 2018-01-17 LAB — LIPID PANEL
CHOLESTEROL: 148 mg/dL (ref <200)
HDL: 51 mg/dL (ref >50)
LDL Cholesterol (Calc): 82 mg/dL (calc)
Non-HDL Cholesterol (Calc): 97 mg/dL (calc) (ref <130)
Total CHOL/HDL Ratio: 2.9 (calc) (ref <5.0)
Triglycerides: 72 mg/dL (ref <150)

## 2018-01-17 LAB — COMPLETE METABOLIC PANEL WITH GFR
AG RATIO: 1.9 (calc) (ref 1.0–2.5)
ALKALINE PHOSPHATASE (APISO): 91 U/L (ref 33–130)
ALT: 15 U/L (ref 6–29)
AST: 20 U/L (ref 10–35)
Albumin: 4 g/dL (ref 3.6–5.1)
BUN: 11 mg/dL (ref 7–25)
CHLORIDE: 107 mmol/L (ref 98–110)
CO2: 28 mmol/L (ref 20–32)
Calcium: 9.3 mg/dL (ref 8.6–10.4)
Creat: 0.68 mg/dL (ref 0.60–0.93)
GFR, Est African American: 96 mL/min/{1.73_m2} (ref >=60)
GFR, Est Non African American: 83 mL/min/{1.73_m2} (ref >=60)
GLOBULIN: 2.1 g/dL (ref 1.9–3.7)
Glucose, Bld: 116 mg/dL — ABNORMAL HIGH (ref 65–99)
POTASSIUM: 4.7 mmol/L (ref 3.5–5.3)
SODIUM: 141 mmol/L (ref 135–146)
Total Bilirubin: 0.5 mg/dL (ref 0.2–1.2)
Total Protein: 6.1 g/dL (ref 6.1–8.1)

## 2018-01-17 LAB — HEMOGLOBIN A1C
HEMOGLOBIN A1C: 6.3 %{Hb} — AB (ref <5.7)
Mean Plasma Glucose: 134 (calc)
eAG (mmol/L): 7.4 (calc)

## 2018-01-17 LAB — MICROALBUMIN, URINE: Microalb, Ur: 0.5 mg/dL

## 2018-01-19 LAB — HM DIABETES EYE EXAM

## 2018-01-23 ENCOUNTER — Encounter: Payer: Self-pay | Admitting: *Deleted

## 2018-01-28 ENCOUNTER — Encounter: Payer: Self-pay | Admitting: *Deleted

## 2018-02-02 ENCOUNTER — Other Ambulatory Visit: Payer: Self-pay | Admitting: Family Medicine

## 2018-03-13 ENCOUNTER — Encounter: Payer: Self-pay | Admitting: Family Medicine

## 2018-03-13 ENCOUNTER — Ambulatory Visit (INDEPENDENT_AMBULATORY_CARE_PROVIDER_SITE_OTHER): Payer: Medicare Other | Admitting: Family Medicine

## 2018-03-13 VITALS — BP 140/80 | HR 62 | Temp 97.8°F | Resp 18 | Ht 63.0 in | Wt 172.0 lb

## 2018-03-13 DIAGNOSIS — J209 Acute bronchitis, unspecified: Secondary | ICD-10-CM | POA: Diagnosis not present

## 2018-03-13 MED ORDER — AZITHROMYCIN 250 MG PO TABS: ORAL_TABLET | ORAL | 0 refills | Status: DC

## 2018-03-13 MED ORDER — PREDNISONE 20 MG PO TABS: ORAL_TABLET | ORAL | 0 refills | Status: DC

## 2018-03-13 MED ORDER — ALBUTEROL SULFATE HFA 108 (90 BASE) MCG/ACT IN AERS: 2.0000 | INHALATION_SPRAY | Freq: Four times a day (QID) | RESPIRATORY_TRACT | 0 refills | Status: DC | PRN

## 2018-03-13 NOTE — Progress Notes (Signed)
Subjective:    Patient ID: Sherri Leon, female    DOB: March 04, 1936, 82 y.o.   MRN: [id number]  HPI  Patient has been sick for more than a week.  She reports subjective fevers, purulent sputum, chest congestion, pleurisy, shortness of breath with exertion, and audible wheezing.  She has no history of asthma or reactive airway disease however today on exam, she has diminished breath sounds bilaterally with expiratory wheezing and rhonchorous breath sounds.  She denies any hemoptysis but she does report green sputum.  She denies any history of allergies.  She denies any rhinorrhea or sore throat. Past Medical History:  Diagnosis Date  . Diabetes mellitus without complication ([place])   . Hyperlipidemia   . Hypertension   . Iron deficiency anemia    Past Surgical History:  Procedure Laterality Date  . ABDOMINAL HYSTERECTOMY    . ABDOMINAL HYSTERECTOMY  1970  . APPENDECTOMY  1970  . CARDIAC CATHETERIZATION N/A 08/23/2015   Procedure: Left Heart Cath;  Surgeon: Leonie Man, MD;  Location: [place] [facility];  Service: Cardiovascular;  Laterality: N/A;  . COLONOSCOPY WITH PROPOFOL N/A 06/13/2017   Procedure: COLONOSCOPY WITH PROPOFOL;  Surgeon: Manya Silvas, MD;  Location[facility] ENDOSCOPY;  Service: Endoscopy;  Laterality: N/A;  . ESOPHAGOGASTRODUODENOSCOPY (EGD) WITH PROPOFOL N/A 06/13/2017   Procedure: ESOPHAGOGASTRODUODENOSCOPY (EGD) WITH PROPOFOL;  Surgeon: Manya Silvas, MD;  Location: [facility] ENDOSCOPY;  Service: Endoscopy;  Laterality: N/A;  . HERNIA REPAIR    . TONSILLECTOMY  1955   Current Outpatient Medications on File Prior to Visit  Medication Sig Dispense Refill  . aspirin EC 81 MG tablet Take 81 mg by mouth every other day.     . Blood Glucose Monitoring Suppl ([place]) KIT 1 each by Does not apply route once. Dx E11.9 1 each 0  . GARLIC PO Take by mouth.    Marland Kitchen glucose blood (TRUETRACK TEST) test strip 1 each by Other route 2 (two) times daily. Check  blood sugar twice daily. 100 each 11  . losartan (COZAAR) 100 MG tablet Take 100 mg by mouth daily.    . Multiple Vitamins-Minerals (MULTIVITAMIN ADULT PO) Take by mouth.    . pantoprazole (PROTONIX) 40 MG tablet Take 40 mg by mouth daily.    . pravastatin (PRAVACHOL) 40 MG tablet TAKE ONE (1) TABLET EACH DAY 90 tablet 1   No current facility-administered medications on file prior to visit.    No Known Allergies Social History   Socioeconomic History  . Marital status: Married    Spouse name: Not on file  . Number of children: Not on file  . Years of education: Not on file  . Highest education level: Not on file  Occupational History  . Not on file  Social Needs  . Financial resource strain: Not on file  . Food insecurity:    Worry: Not on file    Inability: Not on file  . Transportation needs:    Medical: Not on file    Non-medical: Not on file  Tobacco Use  . Smoking status: Never Smoker  . Smokeless tobacco: Never Used  Substance and Sexual Activity  . Alcohol use: No  . Drug use: No  . Sexual activity: Not Currently  Lifestyle  . Physical activity:    Days per week: Not on file    Minutes per session: Not on file  . Stress: Not on file  Relationships  . Social connections:  Talks on phone: Not on file    Gets together: Not on file    Attends religious service: Not on file    Active member of club or organization: Not on file    Attends meetings of clubs or organizations: Not on file    Relationship status: Not on file  . Intimate partner violence:    Fear of current or ex partner: Not on file    Emotionally abused: Not on file    Physically abused: Not on file    Forced sexual activity: Not on file  Other Topics Concern  . Not on file  Social History Narrative   ** Merged History Encounter **         Review of Systems  All other systems reviewed and are negative.      Objective:   Physical Exam  Constitutional: She appears well-developed and  well-nourished. No distress.  HENT:  Right Ear: External ear normal.  Left Ear: External ear normal.  Nose: Nose normal.  Mouth/Throat: Oropharynx is clear and moist.  Eyes: Conjunctivae are normal.  Neck: Neck supple.  Cardiovascular: Normal rate, regular rhythm and normal heart sounds. Exam reveals no gallop and no friction rub.  No murmur heard. Pulmonary/Chest: Effort normal. No stridor. No respiratory distress. She has wheezes. She has no rales.  Lymphadenopathy:    She has no cervical adenopathy.  Skin: She is not diaphoretic.  Vitals reviewed.         Assessment & Plan:  Bronchitis with bronchospasm  Start the patient on a Z-Pak to cover streptococcal pneumonia and atypical bacterial infections.  Also add prednisone taper pack given primarily for the audible bronchospasm and expiratory wheezing.  Use albuterol 2 puffs inhaled every 6 hours as needed for wheezing.  Recheck next week if no better or sooner if worse.

## 2018-04-07 ENCOUNTER — Ambulatory Visit (INDEPENDENT_AMBULATORY_CARE_PROVIDER_SITE_OTHER): Payer: Medicare Other | Admitting: Family Medicine

## 2018-04-07 ENCOUNTER — Other Ambulatory Visit: Payer: Self-pay

## 2018-04-07 VITALS — BP 118/76 | HR 69 | Temp 97.7°F | Ht 63.0 in | Wt 171.0 lb

## 2018-04-07 DIAGNOSIS — E119 Type 2 diabetes mellitus without complications: Secondary | ICD-10-CM

## 2018-04-07 DIAGNOSIS — E1165 Type 2 diabetes mellitus with hyperglycemia: Secondary | ICD-10-CM

## 2018-04-07 NOTE — Progress Notes (Signed)
Subjective:    Patient ID: Sherri Leon, female    DOB: February 11, 1936, 82 y.o.   MRN: [id number]  HPI Patient's hemoglobin A1c was last checked in March 1.  At that time it was excellent at 6.3.  She is off all medication.  However now she reports that her sugars are rising out of control.  She states that her fasting blood sugars are never less than 130.  She states that her sugars however are very consistent and typically run in the 130s in the morning.  She states that her 2-hour postprandial sugars are 200-240 in the evening.  She denies any polyuria, polydipsia, or blurry vision.  She also recently reports having "low iron".  She was seen for blood donation and was refused secondary to "anemia".  She started taking her iron supplement independently and states that she feels better however I have no record of her anemia and I am not sure of the extent or whether it was microcytic, normocytic, or macrocytic. Past Medical History:  Diagnosis Date  . Diabetes mellitus without complication ([place])   . Hyperlipidemia   . Hypertension   . Iron deficiency anemia    Past Surgical History:  Procedure Laterality Date  . ABDOMINAL HYSTERECTOMY    . ABDOMINAL HYSTERECTOMY  1970  . APPENDECTOMY  1970  . CARDIAC CATHETERIZATION N/A 08/23/2015   Procedure: Left Heart Cath;  Surgeon: Leonie Man, MD;  Location: [place] [facility];  Service: Cardiovascular;  Laterality: N/A;  . COLONOSCOPY WITH PROPOFOL N/A 06/13/2017   Procedure: COLONOSCOPY WITH PROPOFOL;  Surgeon: Manya Silvas, MD;  Location: [facility] ENDOSCOPY;  Service: Endoscopy;  Laterality: N/A;  . ESOPHAGOGASTRODUODENOSCOPY (EGD) WITH PROPOFOL N/A 06/13/2017   Procedure: ESOPHAGOGASTRODUODENOSCOPY (EGD) WITH PROPOFOL;  Surgeon: Manya Silvas, MD;  Location: [facility] ENDOSCOPY;  Service: Endoscopy;  Laterality: N/A;  . HERNIA REPAIR    . TONSILLECTOMY  1955   Current Outpatient Medications on File Prior to Visit  Medication Sig Dispense  Refill  . aspirin EC 81 MG tablet Take 81 mg by mouth every other day.     . Blood Glucose Monitoring Suppl ([place]) KIT 1 each by Does not apply route once. Dx E11.9 1 each 0  . GARLIC PO Take by mouth.    Marland Kitchen glucose blood (TRUETRACK TEST) test strip 1 each by Other route 2 (two) times daily. Check blood sugar twice daily. 100 each 11  . losartan (COZAAR) 100 MG tablet Take 100 mg by mouth daily.    . Multiple Vitamins-Minerals (MULTIVITAMIN ADULT PO) Take by mouth.    . pantoprazole (PROTONIX) 40 MG tablet Take 40 mg by mouth daily.    . pravastatin (PRAVACHOL) 40 MG tablet TAKE ONE (1) TABLET EACH DAY 90 tablet 1  . albuterol (PROVENTIL HFA;VENTOLIN HFA) 108 (90 Base) MCG/ACT inhaler Inhale 2 puffs into the lungs every 6 (six) hours as needed for wheezing or shortness of breath. (Patient not taking: Reported on 04/07/2018) 1 Inhaler 0   No current facility-administered medications on file prior to visit.    No Known Allergies Social History   Socioeconomic History  . Marital status: Married    Spouse name: Not on file  . Number of children: Not on file  . Years of education: Not on file  . Highest education level: Not on file  Occupational History  . Not on file  Social Needs  . Financial resource strain: Not on file  . Food insecurity:  Worry: Not on file    Inability: Not on file  . Transportation needs:    Medical: Not on file    Non-medical: Not on file  Tobacco Use  . Smoking status: Never Smoker  . Smokeless tobacco: Never Used  Substance and Sexual Activity  . Alcohol use: No  . Drug use: No  . Sexual activity: Not Currently  Lifestyle  . Physical activity:    Days per week: Not on file    Minutes per session: Not on file  . Stress: Not on file  Relationships  . Social connections:    Talks on phone: Not on file    Gets together: Not on file    Attends religious service: Not on file    Active member of club or organization: Not on file     Attends meetings of clubs or organizations: Not on file    Relationship status: Not on file  . Intimate partner violence:    Fear of current or ex partner: Not on file    Emotionally abused: Not on file    Physically abused: Not on file    Forced sexual activity: Not on file  Other Topics Concern  . Not on file  Social History Narrative   ** Merged History Encounter **          Review of Systems  All other systems reviewed and are negative.      Objective:   Physical Exam  Constitutional: She appears well-developed and well-nourished. No distress.  Eyes: Pupils are equal, round, and reactive to light. Conjunctivae and EOM are normal.  Neck: No JVD present. No thyromegaly present.  Cardiovascular: Normal rate, regular rhythm and normal heart sounds. Exam reveals no gallop and no friction rub.  No murmur heard. Pulmonary/Chest: Effort normal and breath sounds normal. No stridor. No respiratory distress. She has no wheezes. She has no rales.  Abdominal: Soft. Bowel sounds are normal. She exhibits no distension and no mass. There is no tenderness. There is no rebound and no guarding.  Musculoskeletal: She exhibits no edema.  Lymphadenopathy:    She has no cervical adenopathy.  Skin: She is not diaphoretic.  Vitals reviewed.         Assessment & Plan:  Controlled type 2 diabetes mellitus without complication, without long-term current use of insulin (HCC)  Type 2 diabetes mellitus with hyperglycemia, without long-term current use of insulin ([place]) - Plan: CBC with Differential/Platelet, COMPLETE METABOLIC PANEL WITH GFR, Hemoglobin A1c  Patient's exam today is normal.  Her blood pressure is excellent.  I will check hemoglobin A1c to get a better idea what her sugars are doing on average.  Given her age, I would be willing to accept the hemoglobin A1c less than 7 however patient is very concerned about her sugars if they are greater than 200.  Therefore, I am suggesting the  patient that we resume metformin.  If hemoglobin A1c is greater than 7, I would resume metformin at 850 mg p.o. twice daily.  Meanwhile I will also check a CBC along with a CMP to evaluate for any further signs of anemia or electrolytes disturbances that could explain her fatigue.

## 2018-04-08 LAB — CBC WITH DIFFERENTIAL/PLATELET
Basophils Absolute: 28 cells/uL (ref 0–200)
Basophils Relative: 0.5 %
EOS PCT: 2.6 %
Eosinophils Absolute: 143 cells/uL (ref 15–500)
HEMATOCRIT: 36.7 % (ref 35.0–45.0)
HEMOGLOBIN: 12.3 g/dL (ref 11.7–15.5)
LYMPHS ABS: 2530 {cells}/uL (ref 850–3900)
MCH: 27.1 pg (ref 27.0–33.0)
MCHC: 33.5 g/dL (ref 32.0–36.0)
MCV: 80.8 fL (ref 80.0–100.0)
MPV: 12.4 fL (ref 7.5–12.5)
Monocytes Relative: 9 %
Neutro Abs: 2305 cells/uL (ref 1500–7800)
Neutrophils Relative %: 41.9 %
Platelets: 172 10*3/uL (ref 140–400)
RBC: 4.54 10*6/uL (ref 3.80–5.10)
RDW: 14.2 % (ref 11.0–15.0)
Total Lymphocyte: 46 %
WBC: 5.5 10*3/uL (ref 3.8–10.8)
WBCMIX: 495 {cells}/uL (ref 200–950)

## 2018-04-08 LAB — COMPLETE METABOLIC PANEL WITH GFR
AG RATIO: 1.9 (calc) (ref 1.0–2.5)
ALT: 15 U/L (ref 6–29)
AST: 22 U/L (ref 10–35)
Albumin: 4.1 g/dL (ref 3.6–5.1)
Alkaline phosphatase (APISO): 87 U/L (ref 33–130)
BUN: 13 mg/dL (ref 7–25)
CALCIUM: 9.6 mg/dL (ref 8.6–10.4)
CO2: 27 mmol/L (ref 20–32)
CREATININE: 0.73 mg/dL (ref 0.60–0.88)
Chloride: 108 mmol/L (ref 98–110)
GFR, EST AFRICAN AMERICAN: 90 mL/min/{1.73_m2} (ref >=60)
GFR, EST NON AFRICAN AMERICAN: 78 mL/min/{1.73_m2} (ref >=60)
GLOBULIN: 2.2 g/dL (ref 1.9–3.7)
Glucose, Bld: 89 mg/dL (ref 65–99)
POTASSIUM: 4.7 mmol/L (ref 3.5–5.3)
SODIUM: 141 mmol/L (ref 135–146)
Total Bilirubin: 0.5 mg/dL (ref 0.2–1.2)
Total Protein: 6.3 g/dL (ref 6.1–8.1)

## 2018-04-08 LAB — HEMOGLOBIN A1C
EAG (MMOL/L): 8.4 (calc)
Hgb A1c MFr Bld: 6.9 % of total Hgb — ABNORMAL HIGH (ref <5.7)
MEAN PLASMA GLUCOSE: 151 (calc)

## 2018-04-09 ENCOUNTER — Encounter: Payer: Self-pay | Admitting: *Deleted

## 2018-04-09 MED ORDER — METFORMIN HCL 500 MG PO TABS: 500.0000 mg | ORAL_TABLET | Freq: Two times a day (BID) | ORAL | 3 refills | Status: DC

## 2018-05-25 ENCOUNTER — Ambulatory Visit: Payer: Medicare Other | Admitting: Family Medicine

## 2018-06-11 ENCOUNTER — Encounter: Payer: Self-pay | Admitting: Family Medicine

## 2018-06-11 ENCOUNTER — Ambulatory Visit (INDEPENDENT_AMBULATORY_CARE_PROVIDER_SITE_OTHER): Payer: Medicare Other | Admitting: Family Medicine

## 2018-06-11 VITALS — BP 110/60 | HR 74 | Temp 97.9°F | Resp 14 | Ht 63.0 in | Wt 164.0 lb

## 2018-06-11 DIAGNOSIS — M79641 Pain in right hand: Secondary | ICD-10-CM

## 2018-06-11 MED ORDER — DICLOFENAC SODIUM 75 MG PO TBEC: 75.0000 mg | DELAYED_RELEASE_TABLET | Freq: Two times a day (BID) | ORAL | 0 refills | Status: DC

## 2018-06-11 NOTE — Progress Notes (Signed)
Subjective:    Patient ID: Sherri Leon, female    DOB: May 05, 1936, 82 y.o.   MRN: [id number]  HPI Patient presents today with pain in her right hand.  She states that she injured her right third PIP joint when she was 18 working in a [employer].  She had surgery on the joint at that time due to an infection that she obtain after the injury.  There has been a large nodular lesion on the ulnar aspect of the joint ever since.  She has a slight flexion deformity and decreased range of motion in that joint ever since.  Recently, she was trying to pull start a lawnmower.  The starter recoiled jerking her joint causing significant sudden pain in the PIP joint.  There is now sharp stinging pain radiating from the third PIP joint up into the third metacarpal.  It aches and throbs.  Visibly there is no swelling.  There is no ecchymosis.  She has full range of motion of all the MCP joints.  She also has full and painless range of motion of the PIP and DIP joints on all of her fingers except the third 1.  There is slight decreased flexion at the third PIP joint and there is the chronic bony deformity Past Medical History:  Diagnosis Date  . Diabetes mellitus without complication ([place])   . Hyperlipidemia   . Hypertension   . Iron deficiency anemia    Past Surgical History:  Procedure Laterality Date  . ABDOMINAL HYSTERECTOMY    . ABDOMINAL HYSTERECTOMY  1970  . APPENDECTOMY  1970  . CARDIAC CATHETERIZATION N/A 08/23/2015   Procedure: Left Heart Cath;  Surgeon: Leonie Man, MD;  Location: [place] [facility];  Service: Cardiovascular;  Laterality: N/A;  . COLONOSCOPY WITH PROPOFOL N/A 06/13/2017   Procedure: COLONOSCOPY WITH PROPOFOL;  Surgeon: Manya Silvas, MD;  Location: [facility] ENDOSCOPY;  Service: Endoscopy;  Laterality: N/A;  . ESOPHAGOGASTRODUODENOSCOPY (EGD) WITH PROPOFOL N/A 06/13/2017   Procedure: ESOPHAGOGASTRODUODENOSCOPY (EGD) WITH PROPOFOL;  Surgeon: Manya Silvas, MD;  Location:  [facility] ENDOSCOPY;  Service: Endoscopy;  Laterality: N/A;  . HERNIA REPAIR    . TONSILLECTOMY  1955   Current Outpatient Medications on File Prior to Visit  Medication Sig Dispense Refill  . aspirin EC 81 MG tablet Take 81 mg by mouth every other day.     . Blood Glucose Monitoring Suppl ([place]) KIT 1 each by Does not apply route once. Dx E11.9 1 each 0  . GARLIC PO Take by mouth.    Marland Kitchen glucose blood (TRUETRACK TEST) test strip 1 each by Other route 2 (two) times daily. Check blood sugar twice daily. 100 each 11  . losartan (COZAAR) 100 MG tablet Take 100 mg by mouth daily.    . metFORMIN (GLUCOPHAGE) 500 MG tablet Take 1 tablet (500 mg total) by mouth 2 (two) times daily with a meal. 180 tablet 3  . Multiple Vitamins-Minerals (MULTIVITAMIN ADULT PO) Take by mouth.    . pantoprazole (PROTONIX) 40 MG tablet Take 40 mg by mouth daily.    . pravastatin (PRAVACHOL) 40 MG tablet TAKE ONE (1) TABLET EACH DAY 90 tablet 1   No current facility-administered medications on file prior to visit.    No Known Allergies Social History   Socioeconomic History  . Marital status: Married    Spouse name: Not on file  . Number of children: Not on file  . Years of education: Not  on file  . Highest education level: Not on file  Occupational History  . Not on file  Social Needs  . Financial resource strain: Not on file  . Food insecurity:    Worry: Not on file    Inability: Not on file  . Transportation needs:    Medical: Not on file    Non-medical: Not on file  Tobacco Use  . Smoking status: Never Smoker  . Smokeless tobacco: Never Used  Substance and Sexual Activity  . Alcohol use: No  . Drug use: No  . Sexual activity: Not Currently  Lifestyle  . Physical activity:    Days per week: Not on file    Minutes per session: Not on file  . Stress: Not on file  Relationships  . Social connections:    Talks on phone: Not on file    Gets together: Not on file    Attends religious  service: Not on file    Active member of club or organization: Not on file    Attends meetings of clubs or organizations: Not on file    Relationship status: Not on file  . Intimate partner violence:    Fear of current or ex partner: Not on file    Emotionally abused: Not on file    Physically abused: Not on file    Forced sexual activity: Not on file  Other Topics Concern  . Not on file  Social History Narrative   ** Merged History Encounter **          Review of Systems  All other systems reviewed and are negative.      Objective:   Physical Exam  Constitutional: She appears well-developed and well-nourished. No distress.  Eyes: Pupils are equal, round, and reactive to light. Conjunctivae and EOM are normal.  Neck: No JVD present. No thyromegaly present.  Cardiovascular: Normal rate, regular rhythm and normal heart sounds. Exam reveals no gallop and no friction rub.  No murmur heard. Pulmonary/Chest: Effort normal and breath sounds normal. No stridor. No respiratory distress. She has no wheezes. She has no rales.  Musculoskeletal:       Right hand: She exhibits decreased range of motion, tenderness, bony tenderness and deformity. Normal sensation noted. Normal strength noted.       Hands: Lymphadenopathy:    She has no cervical adenopathy.  Skin: She is not diaphoretic.  Vitals reviewed.         Assessment & Plan:  Right hand pain - Plan: diclofenac (VOLTAREN) 75 MG EC tablet, DG Hand Complete Right  Obtain x-ray of the hand to rule out an occult fracture.  I suspect that the injury/mechanism of action disrupted scar tissue around the PIP joint causing severe pain.  I recommended diclofenac 75 mg p.o. twice daily and ice be applied to the joint 2-3 times a day and then recheck in 1 week if the x-ray is negative for any fracture.  She was also concerned about a lesion on her right temple.  It is a well-circumscribed skin colored nodular lesion approximately 3 mm in  diameter.  It appears to be a dermal nevus.  I recommended that we simply monitor this.  It is been present for many years.  She only recently became concerned when she started to occasionally have headaches in that area

## 2018-06-16 ENCOUNTER — Encounter: Payer: Self-pay | Admitting: *Deleted

## 2018-07-15 ENCOUNTER — Other Ambulatory Visit: Payer: Self-pay | Admitting: Family Medicine

## 2018-07-15 DIAGNOSIS — R1310 Dysphagia, unspecified: Secondary | ICD-10-CM

## 2018-07-15 DIAGNOSIS — R1319 Other dysphagia: Secondary | ICD-10-CM

## 2018-08-12 ENCOUNTER — Other Ambulatory Visit: Payer: Self-pay | Admitting: Family Medicine

## 2018-08-26 ENCOUNTER — Ambulatory Visit (INDEPENDENT_AMBULATORY_CARE_PROVIDER_SITE_OTHER): Payer: Medicare Other | Admitting: Family Medicine

## 2018-08-26 DIAGNOSIS — Z23 Encounter for immunization: Secondary | ICD-10-CM | POA: Diagnosis not present

## 2018-09-10 ENCOUNTER — Other Ambulatory Visit: Payer: Self-pay | Admitting: Family Medicine

## 2018-09-10 DIAGNOSIS — I10 Essential (primary) hypertension: Secondary | ICD-10-CM

## 2018-12-23 ENCOUNTER — Telehealth: Payer: Self-pay | Admitting: Family Medicine

## 2018-12-23 DIAGNOSIS — Z1231 Encounter for screening mammogram for malignant neoplasm of breast: Secondary | ICD-10-CM | POA: Diagnosis not present

## 2018-12-23 LAB — HM MAMMOGRAPHY

## 2018-12-23 NOTE — Telephone Encounter (Signed)
PATIENT IS CALLING TO GET RX SENT FOR HER STRIPS AND A NEW METER IF POSSIBLE  THE REASON SHE NEEDS NEW METER IS BECAUSE HER PHARMACY DOES NOT CARRY STRIPS FOR HER CURRENT METER  PLEASE CALL HER IF ANY QUESTIONS  [facility] [place]

## 2018-12-24 ENCOUNTER — Encounter: Payer: Self-pay | Admitting: *Deleted

## 2018-12-24 MED ORDER — BLOOD GLUCOSE MONITORING SUPPL DEVI: 3 refills | Status: DC

## 2018-12-24 NOTE — Telephone Encounter (Signed)
Sent to requested pharmacy.

## 2018-12-24 NOTE — Telephone Encounter (Signed)
[facility] [address]

## 2018-12-28 ENCOUNTER — Ambulatory Visit (INDEPENDENT_AMBULATORY_CARE_PROVIDER_SITE_OTHER): Payer: Medicare Other | Admitting: Family Medicine

## 2018-12-28 ENCOUNTER — Encounter: Payer: Self-pay | Admitting: Family Medicine

## 2018-12-28 VITALS — BP 118/68 | HR 72 | Temp 97.6°F | Resp 16 | Ht 63.0 in | Wt 171.0 lb

## 2018-12-28 DIAGNOSIS — M25512 Pain in left shoulder: Secondary | ICD-10-CM

## 2018-12-28 DIAGNOSIS — E1165 Type 2 diabetes mellitus with hyperglycemia: Secondary | ICD-10-CM | POA: Diagnosis not present

## 2018-12-28 NOTE — Progress Notes (Signed)
Subjective:    Patient ID: Sherri Leon, female    DOB: January 08, 1936, 83 y.o.   MRN: [id number]  HPI Patient reports 2 weeks of pain in her left shoulder.  She reports pain with abduction greater than 90 degrees.  She reports pain with internal and external rotation as well as audible crepitus in the left shoulder.  There is a lipoma roughly the size of a ping-pong ball on the anterior surface of her left shoulder but she states that that has been there her entire life.  There is no change in the soft tissue mass.  It is not tender.  She has a positive empty can sign.  She has crepitus on exam with range of motion.  She has a negative Hawkins sign.  She has normal strength with empty can sign however it does elicit pain.  She has with abduction greater than 100 degrees although her strength is normal.  She states that she started taking diclofenac 75 mg twice daily 2 to 3 days ago and the pain has improved dramatically.  She is questioning if she could still take that medication.  She also would like to recheck her lab work regarding her diabetes.  Her meter is no longer functional and therefore she has not been able to check her blood sugar recently.  She denies any polyuria, polydipsia, or blurry vision Past Medical History:  Diagnosis Date  . Diabetes mellitus without complication ([place])   . Hyperlipidemia   . Hypertension   . Iron deficiency anemia    Past Surgical History:  Procedure Laterality Date  . ABDOMINAL HYSTERECTOMY    . ABDOMINAL HYSTERECTOMY  1970  . APPENDECTOMY  1970  . CARDIAC CATHETERIZATION N/A 08/23/2015   Procedure: Left Heart Cath;  Surgeon: Leonie Man, MD;  Location: [place] [facility];  Service: Cardiovascular;  Laterality: N/A;  . COLONOSCOPY WITH PROPOFOL N/A 06/13/2017   Procedure: COLONOSCOPY WITH PROPOFOL;  Surgeon: Manya Silvas, MD;  Location: [facility] ENDOSCOPY;  Service: Endoscopy;  Laterality: N/A;  . ESOPHAGOGASTRODUODENOSCOPY (EGD) WITH PROPOFOL N/A  06/13/2017   Procedure: ESOPHAGOGASTRODUODENOSCOPY (EGD) WITH PROPOFOL;  Surgeon: Manya Silvas, MD;  Location: [facility] ENDOSCOPY;  Service: Endoscopy;  Laterality: N/A;  . HERNIA REPAIR    . TONSILLECTOMY  1955   Current Outpatient Medications on File Prior to Visit  Medication Sig Dispense Refill  . aspirin EC 81 MG tablet Take 81 mg by mouth every other day.     . diclofenac (VOLTAREN) 75 MG EC tablet Take 1 tablet (75 mg total) by mouth 2 (two) times daily. 60 tablet 0  . GARLIC PO Take by mouth.    . losartan (COZAAR) 100 MG tablet Take 100 mg by mouth daily.    . metFORMIN (GLUCOPHAGE) 500 MG tablet Take 1 tablet (500 mg total) by mouth 2 (two) times daily with a meal. 180 tablet 3  . Multiple Vitamins-Minerals (MULTIVITAMIN ADULT PO) Take by mouth.    . pantoprazole (PROTONIX) 40 MG tablet Take 1 tablet (40 mg total) by mouth 2 (two) times daily. 60 tablet 10  . pravastatin (PRAVACHOL) 40 MG tablet TAKE 1 TABLET BY MOUTH ONCE A DAY 90 tablet 1   No current facility-administered medications on file prior to visit.    No Known Allergies Social History   Socioeconomic History  . Marital status: Married    Spouse name: Not on file  . Number of children: Not on file  . Years of education: Not  on file  . Highest education level: Not on file  Occupational History  . Not on file  Social Needs  . Financial resource strain: Not on file  . Food insecurity:    Worry: Not on file    Inability: Not on file  . Transportation needs:    Medical: Not on file    Non-medical: Not on file  Tobacco Use  . Smoking status: Never Smoker  . Smokeless tobacco: Never Used  Substance and Sexual Activity  . Alcohol use: No  . Drug use: No  . Sexual activity: Not Currently  Lifestyle  . Physical activity:    Days per week: Not on file    Minutes per session: Not on file  . Stress: Not on file  Relationships  . Social connections:    Talks on phone: Not on file    Gets together: Not on  file    Attends religious service: Not on file    Active member of club or organization: Not on file    Attends meetings of clubs or organizations: Not on file    Relationship status: Not on file  . Intimate partner violence:    Fear of current or ex partner: Not on file    Emotionally abused: Not on file    Physically abused: Not on file    Forced sexual activity: Not on file  Other Topics Concern  . Not on file  Social History Narrative   ** Merged History Encounter **          Review of Systems  All other systems reviewed and are negative.      Objective:   Physical Exam Vitals signs reviewed.  Constitutional:      General: She is not in acute distress.    Appearance: She is well-developed. She is not diaphoretic.  Eyes:     Conjunctiva/sclera: Conjunctivae normal.     Pupils: Pupils are equal, round, and reactive to light.  Neck:     Thyroid: No thyromegaly.     Vascular: No JVD.  Cardiovascular:     Rate and Rhythm: Normal rate and regular rhythm.     Heart sounds: Normal heart sounds. No murmur. No friction rub. No gallop.   Pulmonary:     Effort: Pulmonary effort is normal. No respiratory distress.     Breath sounds: Normal breath sounds. No stridor. No wheezing or rales.  Musculoskeletal:     Left shoulder: She exhibits decreased range of motion, tenderness, deformity and pain. She exhibits no bony tenderness and normal strength.       Arms:  Lymphadenopathy:     Cervical: No cervical adenopathy.           Assessment & Plan:  Type 2 diabetes mellitus with hyperglycemia, without long-term current use of insulin (HCC) - Plan: COMPLETE METABOLIC PANEL WITH GFR, Hemoglobin A1c, CBC with Differential/Platelet  Left shoulder pain presumed to be due to a combination of osteoarthritis in the left shoulder coupled with subacromial bursitis.  The patient has been taking diclofenac 75 mg twice a day now for 3 days and the pain has improved dramatically.  I  recommend that she continue diclofenac 75 mg twice a day for the next 1 to 2 weeks.  This should address the pain in her hands and in her left shoulder.  If the pain worsens in her left shoulder I will proceed with an x-ray and offer the patient a subacromial cortisone injection.  Also will check  lab work including a CBC, CMP, and hemoglobin A1c to monitor her diabetes

## 2018-12-29 LAB — COMPLETE METABOLIC PANEL WITH GFR
AG RATIO: 1.5 (calc) (ref 1.0–2.5)
ALBUMIN MSPROF: 4.1 g/dL (ref 3.6–5.1)
ALT: 16 U/L (ref 6–29)
AST: 21 U/L (ref 10–35)
Alkaline phosphatase (APISO): 83 U/L (ref 37–153)
BUN: 13 mg/dL (ref 7–25)
CALCIUM: 10.1 mg/dL (ref 8.6–10.4)
CO2: 29 mmol/L (ref 20–32)
Chloride: 105 mmol/L (ref 98–110)
Creat: 0.72 mg/dL (ref 0.60–0.88)
GFR, EST NON AFRICAN AMERICAN: 79 mL/min/{1.73_m2} (ref >=60)
GFR, Est African American: 92 mL/min/{1.73_m2} (ref >=60)
Globulin: 2.7 g/dL (calc) (ref 1.9–3.7)
Glucose, Bld: 103 mg/dL — ABNORMAL HIGH (ref 65–99)
POTASSIUM: 4.1 mmol/L (ref 3.5–5.3)
SODIUM: 140 mmol/L (ref 135–146)
Total Bilirubin: 0.4 mg/dL (ref 0.2–1.2)
Total Protein: 6.8 g/dL (ref 6.1–8.1)

## 2018-12-29 LAB — CBC WITH DIFFERENTIAL/PLATELET
Absolute Monocytes: 419 cells/uL (ref 200–950)
BASOS PCT: 0.5 %
Basophils Absolute: 30 cells/uL (ref 0–200)
Eosinophils Absolute: 171 cells/uL (ref 15–500)
Eosinophils Relative: 2.9 %
HEMATOCRIT: 38.7 % (ref 35.0–45.0)
Hemoglobin: 12.9 g/dL (ref 11.7–15.5)
Lymphs Abs: 2266 cells/uL (ref 850–3900)
MCH: 27.7 pg (ref 27.0–33.0)
MCHC: 33.3 g/dL (ref 32.0–36.0)
MCV: 83 fL (ref 80.0–100.0)
MPV: 12.2 fL (ref 7.5–12.5)
Monocytes Relative: 7.1 %
Neutro Abs: 3015 cells/uL (ref 1500–7800)
Neutrophils Relative %: 51.1 %
PLATELETS: 213 10*3/uL (ref 140–400)
RBC: 4.66 10*6/uL (ref 3.80–5.10)
RDW: 13.6 % (ref 11.0–15.0)
TOTAL LYMPHOCYTE: 38.4 %
WBC: 5.9 10*3/uL (ref 3.8–10.8)

## 2018-12-29 LAB — HEMOGLOBIN A1C
Hgb A1c MFr Bld: 6.4 % of total Hgb — ABNORMAL HIGH (ref <5.7)
MEAN PLASMA GLUCOSE: 137 (calc)
eAG (mmol/L): 7.6 (calc)

## 2019-01-04 ENCOUNTER — Encounter: Payer: Self-pay | Admitting: Family Medicine

## 2019-01-04 ENCOUNTER — Ambulatory Visit (INDEPENDENT_AMBULATORY_CARE_PROVIDER_SITE_OTHER): Payer: Medicare Other | Admitting: Family Medicine

## 2019-01-04 VITALS — BP 160/90 | HR 60 | Temp 97.8°F | Resp 14 | Ht 63.0 in | Wt 172.0 lb

## 2019-01-04 DIAGNOSIS — M25512 Pain in left shoulder: Secondary | ICD-10-CM | POA: Diagnosis not present

## 2019-01-04 NOTE — Progress Notes (Signed)
Subjective:    Patient ID: Sherri Leon, female    DOB: 1936/04/12, 83 y.o.   MRN: [id number]  HPI   12/28/18 Patient reports 2 weeks of pain in her left shoulder.  She reports pain with abduction greater than 90 degrees.  She reports pain with internal and external rotation as well as audible crepitus in the left shoulder.  There is a lipoma roughly the size of a ping-pong ball on the anterior surface of her left shoulder but she states that that has been there her entire life.  There is no change in the soft tissue mass.  It is not tender.  She has a positive empty can sign.  She has crepitus on exam with range of motion.  She has a negative Hawkins sign.  She has normal strength with empty can sign however it does elicit pain.  She has with abduction greater than 100 degrees although her strength is normal.  She states that she started taking diclofenac 75 mg twice daily 2 to 3 days ago and the pain has improved dramatically.  She is questioning if she could still take that medication.  She also would like to recheck her lab work regarding her diabetes.  Her meter is no longer functional and therefore she has not been able to check her blood sugar recently.  She denies any polyuria, polydipsia, or blurry vision.  At that time, my plan was: Left shoulder pain presumed to be due to a combination of osteoarthritis in the left shoulder coupled with subacromial bursitis.  The patient has been taking diclofenac 75 mg twice a day now for 3 days and the pain has improved dramatically.  I recommend that she continue diclofenac 75 mg twice a day for the next 1 to 2 weeks.  This should address the pain in her hands and in her left shoulder.  If the pain worsens in her left shoulder I will proceed with an x-ray and offer the patient a subacromial cortisone injection.  Also will check lab work including a CBC, CMP, and hemoglobin A1c to monitor her diabetes.  01/04/19 Patient states the pain in her left shoulder  is gotten worse.  She is unable to lift her left shoulder greater than 90 degrees.  She is requesting a cortisone injection in her left shoulder.  She states that she cannot sleep on her shoulder at night due to the pain.  She also reports crepitus.  The diclofenac is providing very little relief.  Blood pressure is elevated today at 160/90 but she attributes this mainly to pain.  Past Medical History:  Diagnosis Date  . Diabetes mellitus without complication ([place])   . Hyperlipidemia   . Hypertension   . Iron deficiency anemia    Past Surgical History:  Procedure Laterality Date  . ABDOMINAL HYSTERECTOMY    . ABDOMINAL HYSTERECTOMY  1970  . APPENDECTOMY  1970  . CARDIAC CATHETERIZATION N/A 08/23/2015   Procedure: Left Heart Cath;  Surgeon: Leonie Man, MD;  Location: [place] [facility];  Service: Cardiovascular;  Laterality: N/A;  . COLONOSCOPY WITH PROPOFOL N/A 06/13/2017   Procedure: COLONOSCOPY WITH PROPOFOL;  Surgeon: Manya Silvas, MD;  Location: [facility] ENDOSCOPY;  Service: Endoscopy;  Laterality: N/A;  . ESOPHAGOGASTRODUODENOSCOPY (EGD) WITH PROPOFOL N/A 06/13/2017   Procedure: ESOPHAGOGASTRODUODENOSCOPY (EGD) WITH PROPOFOL;  Surgeon: Manya Silvas, MD;  Location: [facility] ENDOSCOPY;  Service: Endoscopy;  Laterality: N/A;  . HERNIA REPAIR    . TONSILLECTOMY  1955  Current Outpatient Medications on File Prior to Visit  Medication Sig Dispense Refill  . aspirin EC 81 MG tablet Take 81 mg by mouth every other day.     . diclofenac (VOLTAREN) 75 MG EC tablet Take 1 tablet (75 mg total) by mouth 2 (two) times daily. 60 tablet 0  . GARLIC PO Take by mouth.    . losartan (COZAAR) 100 MG tablet Take 100 mg by mouth daily.    . metFORMIN (GLUCOPHAGE) 500 MG tablet Take 1 tablet (500 mg total) by mouth 2 (two) times daily with a meal. 180 tablet 3  . Multiple Vitamins-Minerals (MULTIVITAMIN ADULT PO) Take by mouth.    . pantoprazole (PROTONIX) 40 MG tablet Take 1 tablet (40 mg total)  by mouth 2 (two) times daily. 60 tablet 10  . pravastatin (PRAVACHOL) 40 MG tablet TAKE 1 TABLET BY MOUTH ONCE A DAY 90 tablet 1   No current facility-administered medications on file prior to visit.    No Known Allergies Social History   Socioeconomic History  . Marital status: Married    Spouse name: Not on file  . Number of children: Not on file  . Years of education: Not on file  . Highest education level: Not on file  Occupational History  . Not on file  Social Needs  . Financial resource strain: Not on file  . Food insecurity:    Worry: Not on file    Inability: Not on file  . Transportation needs:    Medical: Not on file    Non-medical: Not on file  Tobacco Use  . Smoking status: Never Smoker  . Smokeless tobacco: Never Used  Substance and Sexual Activity  . Alcohol use: No  . Drug use: No  . Sexual activity: Not Currently  Lifestyle  . Physical activity:    Days per week: Not on file    Minutes per session: Not on file  . Stress: Not on file  Relationships  . Social connections:    Talks on phone: Not on file    Gets together: Not on file    Attends religious service: Not on file    Active member of club or organization: Not on file    Attends meetings of clubs or organizations: Not on file    Relationship status: Not on file  . Intimate partner violence:    Fear of current or ex partner: Not on file    Emotionally abused: Not on file    Physically abused: Not on file    Forced sexual activity: Not on file  Other Topics Concern  . Not on file  Social History Narrative   ** Merged History Encounter **          Review of Systems  All other systems reviewed and are negative.      Objective:   Physical Exam Vitals signs reviewed.  Constitutional:      General: She is not in acute distress.    Appearance: She is well-developed. She is not diaphoretic.  Eyes:     Conjunctiva/sclera: Conjunctivae normal.     Pupils: Pupils are equal, round, and  reactive to light.  Neck:     Thyroid: No thyromegaly.     Vascular: No JVD.  Cardiovascular:     Rate and Rhythm: Normal rate and regular rhythm.     Heart sounds: Normal heart sounds. No murmur. No friction rub. No gallop.   Pulmonary:     Effort: Pulmonary effort  is normal. No respiratory distress.     Breath sounds: Normal breath sounds. No stridor. No wheezing or rales.  Musculoskeletal:     Left shoulder: She exhibits decreased range of motion, tenderness, deformity and pain. She exhibits no bony tenderness and normal strength.       Arms:  Lymphadenopathy:     Cervical: No cervical adenopathy.           Assessment & Plan:  Acute pain of left shoulder  Patient has left subacromial bursitis.  Using sterile technique, I injected the left subacromial space with 2 cc lidocaine, 2 cc of Marcaine, and 2 cc of 40 mg/mL Kenalog.  The patient tolerated the procedure well without complication.  Of asked the patient to monitor her blood pressure and call me with the values later this week

## 2019-01-07 ENCOUNTER — Telehealth: Payer: Self-pay | Admitting: Family Medicine

## 2019-01-07 NOTE — Telephone Encounter (Signed)
Patients blood pressure is high, would like a call back regarding this  [phone]

## 2019-01-07 NOTE — Telephone Encounter (Signed)
Pt called with BP readings 162/86,151/86,154/71

## 2019-01-07 NOTE — Telephone Encounter (Signed)
Add hctz 25 poqday and recheck in 2 weeks.

## 2019-01-08 MED ORDER — HYDROCHLOROTHIAZIDE 25 MG PO TABS: 25.0000 mg | ORAL_TABLET | Freq: Every day | ORAL | 3 refills | Status: DC

## 2019-01-08 NOTE — Telephone Encounter (Signed)
Med sen to pharm and pt aware via vm

## 2019-02-04 ENCOUNTER — Telehealth: Payer: Self-pay | Admitting: Family Medicine

## 2019-02-04 MED ORDER — PRAVASTATIN SODIUM 40 MG PO TABS: ORAL_TABLET | ORAL | 1 refills | Status: DC

## 2019-02-04 NOTE — Telephone Encounter (Signed)
Medication called/sent to requested pharmacy  

## 2019-02-04 NOTE — Telephone Encounter (Signed)
Needs pravastatin to [facility] [place]

## 2019-03-22 ENCOUNTER — Other Ambulatory Visit: Payer: Self-pay | Admitting: Family Medicine

## 2019-03-22 DIAGNOSIS — M79641 Pain in right hand: Secondary | ICD-10-CM

## 2019-03-22 MED ORDER — DICLOFENAC SODIUM 75 MG PO TBEC: 75.0000 mg | DELAYED_RELEASE_TABLET | Freq: Two times a day (BID) | ORAL | 0 refills | Status: DC

## 2019-04-24 DIAGNOSIS — M25561 Pain in right knee: Secondary | ICD-10-CM | POA: Diagnosis not present

## 2019-05-28 ENCOUNTER — Other Ambulatory Visit: Payer: Self-pay | Admitting: *Deleted

## 2019-05-28 MED ORDER — LOSARTAN POTASSIUM 100 MG PO TABS: 100.0000 mg | ORAL_TABLET | Freq: Every day | ORAL | 1 refills | Status: DC

## 2019-06-07 ENCOUNTER — Other Ambulatory Visit: Payer: Self-pay | Admitting: Family Medicine

## 2019-06-07 MED ORDER — METFORMIN HCL 500 MG PO TABS: 500.0000 mg | ORAL_TABLET | Freq: Two times a day (BID) | ORAL | 3 refills | Status: DC

## 2019-07-01 ENCOUNTER — Other Ambulatory Visit: Payer: Self-pay

## 2019-07-01 NOTE — Patient Outreach (Signed)
[facility]) Care Management  07/01/2019  Sherri Leon Jul 12, 1936 [id number]   Medication Adherence call to Sherri Leon Compliant Voice message left with a call back number. Sherri Leon is showing past due on Metformin 500 mg under [place].   [place] Management Direct Dial [phone]  Fax [phone] [email or web address]

## 2019-07-20 ENCOUNTER — Other Ambulatory Visit: Payer: Self-pay

## 2019-07-20 ENCOUNTER — Ambulatory Visit (INDEPENDENT_AMBULATORY_CARE_PROVIDER_SITE_OTHER): Payer: Medicare Other | Admitting: Family Medicine

## 2019-07-20 ENCOUNTER — Encounter: Payer: Self-pay | Admitting: Family Medicine

## 2019-07-20 VITALS — BP 112/72 | HR 56 | Temp 98.0°F | Resp 16 | Ht 63.0 in | Wt 160.0 lb

## 2019-07-20 DIAGNOSIS — Z23 Encounter for immunization: Secondary | ICD-10-CM

## 2019-07-20 DIAGNOSIS — M19041 Primary osteoarthritis, right hand: Secondary | ICD-10-CM

## 2019-07-20 DIAGNOSIS — M19042 Primary osteoarthritis, left hand: Secondary | ICD-10-CM

## 2019-07-20 DIAGNOSIS — E118 Type 2 diabetes mellitus with unspecified complications: Secondary | ICD-10-CM

## 2019-07-20 DIAGNOSIS — E7800 Pure hypercholesterolemia, unspecified: Secondary | ICD-10-CM | POA: Diagnosis not present

## 2019-07-20 MED ORDER — DICLOFENAC SODIUM 1 % TD GEL: 2.0000 g | Freq: Four times a day (QID) | TRANSDERMAL | 2 refills | Status: DC

## 2019-07-20 MED ORDER — PRAVASTATIN SODIUM 40 MG PO TABS: ORAL_TABLET | ORAL | 1 refills | Status: DC

## 2019-07-20 MED ORDER — LOSARTAN POTASSIUM 100 MG PO TABS: 100.0000 mg | ORAL_TABLET | Freq: Every day | ORAL | 3 refills | Status: DC

## 2019-07-20 NOTE — Progress Notes (Signed)
Subjective:    Patient ID: Sherri Leon, female    DOB: 1936-05-09, 83 y.o.   MRN: [id number]  HPI Since I last saw the patient, her husband passed away.  However she has been doing remarkably well.  She denies any chest pain.  She denies any shortness of breath.  She denies any dyspnea on exertion.  She is here today because she wants her hemoglobin A1c checked.  She denies any polyuria, polydipsia, or blurry vision.  She denies any hypoglycemia.  She is due today for a flu shot as well.  She denies any neuropathy in her feet.  Diabetic foot exam was performed today and is normal.  The patient does complain of arthritic pain in her hands primarily in the DIP and PIP joints.  She has arthritic nodules on the PIP joints of her third and fourth fingers on both hands as well as on the DIP joint of her index finger on her right hand.  She takes diclofenac every night which helps ameliorate the pain however during the day the pain returns.  She denies any GERD or stomach upset despite taking NSAID regularly. Past Medical History:  Diagnosis Date  . Diabetes mellitus without complication ([place])   . Hyperlipidemia   . Hypertension   . Iron deficiency anemia    Past Surgical History:  Procedure Laterality Date  . ABDOMINAL HYSTERECTOMY    . ABDOMINAL HYSTERECTOMY  1970  . APPENDECTOMY  1970  . CARDIAC CATHETERIZATION N/A 08/23/2015   Procedure: Left Heart Cath;  Surgeon: Leonie Man, MD;  Location: [place] [facility];  Service: Cardiovascular;  Laterality: N/A;  . COLONOSCOPY WITH PROPOFOL N/A 06/13/2017   Procedure: COLONOSCOPY WITH PROPOFOL;  Surgeon: Manya Silvas, MD;  Location: [facility] ENDOSCOPY;  Service: Endoscopy;  Laterality: N/A;  . ESOPHAGOGASTRODUODENOSCOPY (EGD) WITH PROPOFOL N/A 06/13/2017   Procedure: ESOPHAGOGASTRODUODENOSCOPY (EGD) WITH PROPOFOL;  Surgeon: Manya Silvas, MD;  Location: [facility] ENDOSCOPY;  Service: Endoscopy;  Laterality: N/A;  . HERNIA REPAIR    .  TONSILLECTOMY  1955   Current Outpatient Medications on File Prior to Visit  Medication Sig Dispense Refill  . aspirin EC 81 MG tablet Take 81 mg by mouth every other day.     . diclofenac (VOLTAREN) 75 MG EC tablet TAKE 1 TABLET(75 MG) BY MOUTH TWICE DAILY 180 tablet 3  . GARLIC PO Take by mouth.    . hydrochlorothiazide (HYDRODIURIL) 25 MG tablet Take 1 tablet (25 mg total) by mouth daily. 90 tablet 3  . metFORMIN (GLUCOPHAGE) 500 MG tablet Take 1 tablet (500 mg total) by mouth 2 (two) times daily with a meal. 180 tablet 3  . Multiple Vitamins-Minerals (MULTIVITAMIN ADULT PO) Take by mouth.    . pantoprazole (PROTONIX) 40 MG tablet Take 1 tablet (40 mg total) by mouth 2 (two) times daily. 60 tablet 10   No current facility-administered medications on file prior to visit.    No Known Allergies Social History   Socioeconomic History  . Marital status: Married    Spouse name: Not on file  . Number of children: Not on file  . Years of education: Not on file  . Highest education level: Not on file  Occupational History  . Not on file  Social Needs  . Financial resource strain: Not on file  . Food insecurity    Worry: Not on file    Inability: Not on file  . Transportation needs    Medical: Not on  file    Non-medical: Not on file  Tobacco Use  . Smoking status: Never Smoker  . Smokeless tobacco: Never Used  Substance and Sexual Activity  . Alcohol use: No  . Drug use: No  . Sexual activity: Not Currently  Lifestyle  . Physical activity    Days per week: Not on file    Minutes per session: Not on file  . Stress: Not on file  Relationships  . Social Herbalist on phone: Not on file    Gets together: Not on file    Attends religious service: Not on file    Active member of club or organization: Not on file    Attends meetings of clubs or organizations: Not on file    Relationship status: Not on file  . Intimate partner violence    Fear of current or ex  partner: Not on file    Emotionally abused: Not on file    Physically abused: Not on file    Forced sexual activity: Not on file  Other Topics Concern  . Not on file  Social History Narrative   ** Merged History Encounter **          Review of Systems  Musculoskeletal: Positive for arthritis.  All other systems reviewed and are negative.      Objective:   Physical Exam Vitals signs reviewed.  Constitutional:      General: She is not in acute distress.    Appearance: She is well-developed. She is not diaphoretic.  Eyes:     Conjunctiva/sclera: Conjunctivae normal.     Pupils: Pupils are equal, round, and reactive to light.  Neck:     Thyroid: No thyromegaly.     Vascular: No JVD.  Cardiovascular:     Rate and Rhythm: Normal rate and regular rhythm.     Heart sounds: Normal heart sounds. No murmur. No friction rub. No gallop.   Pulmonary:     Effort: Pulmonary effort is normal. No respiratory distress.     Breath sounds: Normal breath sounds. No stridor. No wheezing or rales.  Musculoskeletal:     Right hand: She exhibits decreased range of motion, tenderness, bony tenderness and deformity.     Left hand: She exhibits decreased range of motion, tenderness, bony tenderness and deformity.  Lymphadenopathy:     Cervical: No cervical adenopathy.           Assessment & Plan:  Controlled type 2 diabetes mellitus with complication, without long-term current use of insulin (HCC) - Plan: Hemoglobin A1c, CBC with Differential/Platelet, COMPLETE METABOLIC PANEL WITH GFR, Lipid panel  Pure hypercholesterolemia  Osteoarthritis of both hands, unspecified osteoarthritis type  I will check a CBC, CMP, fasting lipid panel, and hemoglobin A1c.  Goal hemoglobin A1c is less than 6.5.  Blood pressure today is well below goal.  Goal LDL cholesterol is less than 100.  We discussed the risk of taking NSAIDs regularly including peptic ulcer disease.  Patient is willing to accept the risk  and will monitor for any GI side effects.  She would like to try Voltaren gel 2 g applied 4 times daily as needed pain in the hands to better manage the pain with less GI toxicity.  She also received her flu shot today.

## 2019-07-20 NOTE — Addendum Note (Signed)
Addended by: Shary Decamp B on: 07/20/2019 01:54 PM   Modules accepted: Orders

## 2019-07-21 LAB — COMPLETE METABOLIC PANEL WITH GFR
AG Ratio: 1.7 (calc) (ref 1.0–2.5)
ALT: 21 U/L (ref 6–29)
AST: 23 U/L (ref 10–35)
Albumin: 3.7 g/dL (ref 3.6–5.1)
Alkaline phosphatase (APISO): 62 U/L (ref 37–153)
BUN: 11 mg/dL (ref 7–25)
CO2: 25 mmol/L (ref 20–32)
Calcium: 9.6 mg/dL (ref 8.6–10.4)
Chloride: 108 mmol/L (ref 98–110)
Creat: 0.76 mg/dL (ref 0.60–0.88)
GFR, Est African American: 85 mL/min/{1.73_m2} (ref >=60)
GFR, Est Non African American: 74 mL/min/{1.73_m2} (ref >=60)
Globulin: 2.2 g/dL (calc) (ref 1.9–3.7)
Glucose, Bld: 115 mg/dL — ABNORMAL HIGH (ref 65–99)
Potassium: 4.2 mmol/L (ref 3.5–5.3)
Sodium: 140 mmol/L (ref 135–146)
Total Bilirubin: 0.4 mg/dL (ref 0.2–1.2)
Total Protein: 5.9 g/dL — ABNORMAL LOW (ref 6.1–8.1)

## 2019-07-21 LAB — CBC WITH DIFFERENTIAL/PLATELET
Absolute Monocytes: 347 cells/uL (ref 200–950)
Basophils Absolute: 22 cells/uL (ref 0–200)
Basophils Relative: 0.4 %
Eosinophils Absolute: 90 cells/uL (ref 15–500)
Eosinophils Relative: 1.6 %
HCT: 38.7 % (ref 35.0–45.0)
Hemoglobin: 12.5 g/dL (ref 11.7–15.5)
Lymphs Abs: 2094 cells/uL (ref 850–3900)
MCH: 27.4 pg (ref 27.0–33.0)
MCHC: 32.3 g/dL (ref 32.0–36.0)
MCV: 84.9 fL (ref 80.0–100.0)
MPV: 12.2 fL (ref 7.5–12.5)
Monocytes Relative: 6.2 %
Neutro Abs: 3046 cells/uL (ref 1500–7800)
Neutrophils Relative %: 54.4 %
Platelets: 194 10*3/uL (ref 140–400)
RBC: 4.56 10*6/uL (ref 3.80–5.10)
RDW: 14.5 % (ref 11.0–15.0)
Total Lymphocyte: 37.4 %
WBC: 5.6 10*3/uL (ref 3.8–10.8)

## 2019-07-21 LAB — LIPID PANEL
Cholesterol: 128 mg/dL (ref <200)
HDL: 43 mg/dL — ABNORMAL LOW (ref >=50)
LDL Cholesterol (Calc): 61 mg/dL (calc)
Non-HDL Cholesterol (Calc): 85 mg/dL (calc) (ref <130)
Total CHOL/HDL Ratio: 3 (calc) (ref <5.0)
Triglycerides: 153 mg/dL — ABNORMAL HIGH (ref <150)

## 2019-07-21 LAB — HEMOGLOBIN A1C
Hgb A1c MFr Bld: 5.9 % of total Hgb — ABNORMAL HIGH (ref <5.7)
Mean Plasma Glucose: 123 (calc)
eAG (mmol/L): 6.8 (calc)

## 2019-07-22 ENCOUNTER — Other Ambulatory Visit: Payer: Self-pay

## 2019-07-22 NOTE — Patient Outreach (Signed)
[facility]) Care Management  07/22/2019  AVALON ENGLEHART 1936-05-09 [id number]   Medication Adherence call to Mrs. Arjay Compliant Voice message left with a call back number. Mrs. Cregan is showing past due on Metformin 500 mg under [facility].   [facility] [place] Management Direct Dial [phone]  Fax [phone] [email or web address]

## 2019-07-27 ENCOUNTER — Telehealth: Payer: Self-pay | Admitting: Family Medicine

## 2019-07-27 NOTE — Telephone Encounter (Signed)
Patient calling regarding getting her lab results  [phone]

## 2019-07-30 NOTE — Telephone Encounter (Signed)
Pt aware of results 

## 2019-08-27 ENCOUNTER — Telehealth: Payer: Self-pay | Admitting: *Deleted

## 2019-08-27 NOTE — Telephone Encounter (Signed)
Received call from patient.   Reports that she has noted increased BP readings for the past 3 days. States that her readings are as follows: 160/ 81, 168/82, 150/78, 164/84.  Patient is unsure of what day/ time readings were taken. Also states that prior the recent elevation, her BP ran normal. Gave readings as 124/76 and 128/60, but again, cannot confirm what day/time readings are from. Patient gave dates in August, but also states that BP machine is not set on correct date.   Patient noted to be very confusing. States that she is taking all her meds, then proceeds to say she is only taking some of them. Reports that she is taking 4 pills daily. Upon review with patient, she states that she is taking her Losartan 100mg , but denies HCTZ.  MD please advise.

## 2019-08-27 NOTE — Telephone Encounter (Signed)
Resume hctz daily and recheck bp here in ov in 2 weeks.

## 2019-08-30 ENCOUNTER — Ambulatory Visit (INDEPENDENT_AMBULATORY_CARE_PROVIDER_SITE_OTHER): Payer: Medicare Other | Admitting: Family Medicine

## 2019-08-30 ENCOUNTER — Other Ambulatory Visit: Payer: Self-pay

## 2019-08-30 VITALS — BP 110/62 | HR 57 | Temp 97.5°F | Resp 18 | Ht 63.0 in | Wt 161.0 lb

## 2019-08-30 DIAGNOSIS — R1310 Dysphagia, unspecified: Secondary | ICD-10-CM

## 2019-08-30 DIAGNOSIS — I10 Essential (primary) hypertension: Secondary | ICD-10-CM

## 2019-08-30 DIAGNOSIS — H6191 Disorder of right external ear, unspecified: Secondary | ICD-10-CM | POA: Diagnosis not present

## 2019-08-30 DIAGNOSIS — R1319 Other dysphagia: Secondary | ICD-10-CM

## 2019-08-30 MED ORDER — PANTOPRAZOLE SODIUM 40 MG PO TBEC: 40.0000 mg | DELAYED_RELEASE_TABLET | Freq: Two times a day (BID) | ORAL | 10 refills | Status: DC

## 2019-08-30 MED ORDER — HYDROCHLOROTHIAZIDE 25 MG PO TABS: 25.0000 mg | ORAL_TABLET | Freq: Every day | ORAL | 3 refills | Status: DC

## 2019-08-30 MED ORDER — TRIAMCINOLONE ACETONIDE 0.1 % EX CREA: 1.0000 "application " | TOPICAL_CREAM | Freq: Two times a day (BID) | CUTANEOUS | 0 refills | Status: DC

## 2019-08-30 NOTE — Telephone Encounter (Signed)
Patient seen in office on 08/30/2019 and provider gave recommendations.

## 2019-08-30 NOTE — Progress Notes (Signed)
Subjective:    Patient ID: Sherri Leon, female    DOB: October 25, 1936, 83 y.o.   MRN: [id number]  Since patient's husband died, her blood pressure has been running extremely high.  Blood pressure has been between [id number] and [id number] systolic over [id number] diastolic.  She denies any chest pain shortness of breath or dyspnea on exertion.  She also has an erythematous tender papule forming on her right helix as diagrammed below.  This is been there for several months. Past Medical History:  Diagnosis Date  . Diabetes mellitus without complication ([place])   . Hyperlipidemia   . Hypertension   . Iron deficiency anemia    Past Surgical History:  Procedure Laterality Date  . ABDOMINAL HYSTERECTOMY    . ABDOMINAL HYSTERECTOMY  1970  . APPENDECTOMY  1970  . CARDIAC CATHETERIZATION N/A 08/23/2015   Procedure: Left Heart Cath;  Surgeon: Leonie Man, MD;  Location: [place] [facility];  Service: Cardiovascular;  Laterality: N/A;  . COLONOSCOPY WITH PROPOFOL N/A 06/13/2017   Procedure: COLONOSCOPY WITH PROPOFOL;  Surgeon: Manya Silvas, MD;  Location: [facility] ENDOSCOPY;  Service: Endoscopy;  Laterality: N/A;  . ESOPHAGOGASTRODUODENOSCOPY (EGD) WITH PROPOFOL N/A 06/13/2017   Procedure: ESOPHAGOGASTRODUODENOSCOPY (EGD) WITH PROPOFOL;  Surgeon: Manya Silvas, MD;  Location: [facility] ENDOSCOPY;  Service: Endoscopy;  Laterality: N/A;  . HERNIA REPAIR    . TONSILLECTOMY  1955   Current Outpatient Medications on File Prior to Visit  Medication Sig Dispense Refill  . aspirin EC 81 MG tablet Take 81 mg by mouth every other day.     . losartan (COZAAR) 100 MG tablet Take 1 tablet (100 mg total) by mouth daily. 90 tablet 3  . metFORMIN (GLUCOPHAGE) 500 MG tablet Take 1 tablet (500 mg total) by mouth 2 (two) times daily with a meal. 180 tablet 3  . Multiple Vitamins-Minerals (MULTIVITAMIN ADULT PO) Take by mouth.    . pantoprazole (PROTONIX) 40 MG tablet Take 1 tablet (40 mg total) by mouth 2 (two) times daily. 60  tablet 10  . pravastatin (PRAVACHOL) 40 MG tablet TAKE 1 TABLET BY MOUTH ONCE A DAY 90 tablet 1   No current facility-administered medications on file prior to visit.    No Known Allergies Social History   Socioeconomic History  . Marital status: Married    Spouse name: Not on file  . Number of children: Not on file  . Years of education: Not on file  . Highest education level: Not on file  Occupational History  . Not on file  Social Needs  . Financial resource strain: Not on file  . Food insecurity    Worry: Not on file    Inability: Not on file  . Transportation needs    Medical: Not on file    Non-medical: Not on file  Tobacco Use  . Smoking status: Never Smoker  . Smokeless tobacco: Never Used  Substance and Sexual Activity  . Alcohol use: No  . Drug use: No  . Sexual activity: Not Currently  Lifestyle  . Physical activity    Days per week: Not on file    Minutes per session: Not on file  . Stress: Not on file  Relationships  . Social Herbalist on phone: Not on file    Gets together: Not on file    Attends religious service: Not on file    Active member of club or organization: Not on file    Attends meetings  of clubs or organizations: Not on file    Relationship status: Not on file  . Intimate partner violence    Fear of current or ex partner: Not on file    Emotionally abused: Not on file    Physically abused: Not on file    Forced sexual activity: Not on file  Other Topics Concern  . Not on file  Social History Narrative   ** Merged History Encounter **          Review of Systems  Musculoskeletal: Positive for arthritis.  All other systems reviewed and are negative.      Objective:   Physical Exam Vitals signs reviewed.  Constitutional:      General: She is not in acute distress.    Appearance: She is well-developed. She is not diaphoretic.  HENT:     Ears:   Eyes:     Conjunctiva/sclera: Conjunctivae normal.     Pupils:  Pupils are equal, round, and reactive to light.  Neck:     Thyroid: No thyromegaly.     Vascular: No JVD.  Cardiovascular:     Rate and Rhythm: Normal rate and regular rhythm.     Heart sounds: Normal heart sounds. No murmur. No friction rub. No gallop.   Pulmonary:     Effort: Pulmonary effort is normal. No respiratory distress.     Breath sounds: Normal breath sounds. No stridor. No wheezing or rales.  Lymphadenopathy:     Cervical: No cervical adenopathy.           Assessment & Plan:  Benign essential HTN - Plan: BASIC METABOLIC PANEL WITH GFR  I rechecked her blood pressure and found it to be elevated at 142/70.  She is getting 150s to 160s at home over 70.  Therefore I recommended that she resume hydrochlorothiazide 25 mg a day and recheck blood pressure in 1 to 2 weeks.  Obtain baseline basic metabolic panel today.  I am also concerned by the lesion on her right helix.  It is an erythematous hard scaly patch.  It appears to be disfiguring of the helix.  I am concerned about squamous cell carcinoma.  It is erythematous and tender.  I will try to calm the inflammation with triamcinolone cream twice daily for 1 week and see if this helps but I will also arrange dermatology consultation for possible biopsy if persistent

## 2019-08-31 LAB — BASIC METABOLIC PANEL WITH GFR
BUN: 11 mg/dL (ref 7–25)
CO2: 28 mmol/L (ref 20–32)
Calcium: 9.6 mg/dL (ref 8.6–10.4)
Chloride: 106 mmol/L (ref 98–110)
Creat: 0.73 mg/dL (ref 0.60–0.88)
GFR, Est African American: 90 mL/min/{1.73_m2} (ref >=60)
GFR, Est Non African American: 77 mL/min/{1.73_m2} (ref >=60)
Glucose, Bld: 153 mg/dL — ABNORMAL HIGH (ref 65–99)
Potassium: 4.3 mmol/L (ref 3.5–5.3)
Sodium: 142 mmol/L (ref 135–146)

## 2019-09-01 ENCOUNTER — Encounter: Payer: Self-pay | Admitting: Family Medicine

## 2019-09-03 ENCOUNTER — Telehealth: Payer: Self-pay | Admitting: Family Medicine

## 2019-09-03 NOTE — Telephone Encounter (Signed)
Patient called in with Blood pressure readings from this week  Tuesday: 153/87 Wednesday: 150/71 Thursday: 145/72 Today: 130/71  Please advise?

## 2019-09-03 NOTE — Telephone Encounter (Signed)
Patient calling to talk about  Her bp reading for the last week  Like dr pickard instructed her to do  [phone]

## 2019-09-03 NOTE — Telephone Encounter (Signed)
Add amlodipine 5 mg poqday

## 2019-09-06 ENCOUNTER — Other Ambulatory Visit: Payer: Self-pay

## 2019-09-06 ENCOUNTER — Other Ambulatory Visit: Payer: Self-pay | Admitting: Family Medicine

## 2019-09-06 ENCOUNTER — Telehealth: Payer: Self-pay | Admitting: Family Medicine

## 2019-09-06 MED ORDER — AMLODIPINE BESYLATE 5 MG PO TABS: 5.0000 mg | ORAL_TABLET | Freq: Every day | ORAL | 0 refills | Status: DC

## 2019-09-06 NOTE — Telephone Encounter (Signed)
See previous phone note.  

## 2019-09-06 NOTE — Telephone Encounter (Signed)
Rx sent to pharmacy   

## 2019-09-06 NOTE — Telephone Encounter (Signed)
Patient is calling to give you her bp results  Saturday  146/71 Sunday  145/74 Monday 140/71

## 2019-09-06 NOTE — Telephone Encounter (Signed)
[place] on home number - tried to call mobile and it was answered and no on ever said anything.

## 2019-09-07 NOTE — Telephone Encounter (Signed)
These are acceptable.  No changes at this time.

## 2019-09-08 NOTE — Telephone Encounter (Signed)
Pt aware via vm 

## 2019-10-21 DIAGNOSIS — H61001 Unspecified perichondritis of right external ear: Secondary | ICD-10-CM | POA: Diagnosis not present

## 2019-10-21 DIAGNOSIS — L820 Inflamed seborrheic keratosis: Secondary | ICD-10-CM | POA: Diagnosis not present

## 2019-11-22 ENCOUNTER — Ambulatory Visit (INDEPENDENT_AMBULATORY_CARE_PROVIDER_SITE_OTHER): Payer: Medicare Other | Admitting: Family Medicine

## 2019-11-22 ENCOUNTER — Other Ambulatory Visit: Payer: Self-pay

## 2019-11-22 ENCOUNTER — Encounter: Payer: Self-pay | Admitting: Family Medicine

## 2019-11-22 VITALS — BP 122/74 | HR 78 | Temp 96.4°F | Resp 16 | Ht 63.0 in | Wt 167.0 lb

## 2019-11-22 DIAGNOSIS — R8290 Unspecified abnormal findings in urine: Secondary | ICD-10-CM | POA: Diagnosis not present

## 2019-11-22 DIAGNOSIS — R350 Frequency of micturition: Secondary | ICD-10-CM

## 2019-11-22 LAB — URINALYSIS, ROUTINE W REFLEX MICROSCOPIC
Bilirubin Urine: NEGATIVE
Glucose, UA: NEGATIVE
Hgb urine dipstick: NEGATIVE
Ketones, ur: NEGATIVE
Leukocytes,Ua: NEGATIVE
Nitrite: NEGATIVE
Protein, ur: NEGATIVE
Specific Gravity, Urine: 1.02 (ref 1.001–1.03)
pH: 6.5 (ref 5.0–8.0)

## 2019-11-22 MED ORDER — LOSARTAN POTASSIUM 100 MG PO TABS: 100.0000 mg | ORAL_TABLET | Freq: Every day | ORAL | 3 refills | Status: DC

## 2019-11-22 MED ORDER — OXYBUTYNIN CHLORIDE 5 MG PO TABS: 5.0000 mg | ORAL_TABLET | Freq: Three times a day (TID) | ORAL | 0 refills | Status: DC | PRN

## 2019-11-22 NOTE — Progress Notes (Signed)
Subjective:    Patient ID: Sherri Leon, female    DOB: 1936-04-17, 84 y.o.   MRN: [id number]  Patient is a very pleasant 84 year old Caucasian female who presents today with a 1 month history of urinary frequency.  She states that she is having to get up several times at night to go and urinate.  She will have sudden urge to urinate.  At times she has urge incontinence.  This primarily at night.  She denies any dysuria.  She denies any hematuria.  She denies any significant frequency during the day is primarily at night when she lies down.  Her blood sugars have been averaging in the 140s therefore I do not feel that this is due to glucosuria.  Urinalysis today shows no blood, no nitrites, no leukocyte esterase.  She denies any fevers or chills. Past Medical History:  Diagnosis Date  . Diabetes mellitus without complication ([place])   . Hyperlipidemia   . Hypertension   . Iron deficiency anemia    Past Surgical History:  Procedure Laterality Date  . ABDOMINAL HYSTERECTOMY    . ABDOMINAL HYSTERECTOMY  1970  . APPENDECTOMY  1970  . CARDIAC CATHETERIZATION N/A 08/23/2015   Procedure: Left Heart Cath;  Surgeon: Leonie Man, MD;  Location: [place] [facility] LAB;  Service: Cardiovascular;  Laterality: N/A;  . COLONOSCOPY WITH PROPOFOL N/A 06/13/2017   Procedure: COLONOSCOPY WITH PROPOFOL;  Surgeon: Manya Silvas, MD;  Location: [facility] ENDOSCOPY;  Service: Endoscopy;  Laterality: N/A;  . ESOPHAGOGASTRODUODENOSCOPY (EGD) WITH PROPOFOL N/A 06/13/2017   Procedure: ESOPHAGOGASTRODUODENOSCOPY (EGD) WITH PROPOFOL;  Surgeon: Manya Silvas, MD;  Location: [facility] ENDOSCOPY;  Service: Endoscopy;  Laterality: N/A;  . HERNIA REPAIR    . TONSILLECTOMY  1955   Current Outpatient Medications on File Prior to Visit  Medication Sig Dispense Refill  . aspirin EC 81 MG tablet Take 81 mg by mouth every other day.     . ferrous sulfate 325 (65 FE) MG tablet Take 325 mg by mouth every other day.    .  metFORMIN (GLUCOPHAGE) 500 MG tablet Take 1 tablet (500 mg total) by mouth 2 (two) times daily with a meal. 180 tablet 3  . Multiple Vitamins-Minerals (MULTIVITAMIN ADULT PO) Take by mouth.    . pantoprazole (PROTONIX) 40 MG tablet Take 1 tablet (40 mg total) by mouth 2 (two) times daily. 60 tablet 10  . pravastatin (PRAVACHOL) 40 MG tablet TAKE 1 TABLET BY MOUTH ONCE A DAY 90 tablet 1   No current facility-administered medications on file prior to visit.   No Known Allergies Social History   Socioeconomic History  . Marital status: Married    Spouse name: Not on file  . Number of children: Not on file  . Years of education: Not on file  . Highest education level: Not on file  Occupational History  . Not on file  Tobacco Use  . Smoking status: Never Smoker  . Smokeless tobacco: Never Used  Substance and Sexual Activity  . Alcohol use: No  . Drug use: No  . Sexual activity: Not Currently  Other Topics Concern  . Not on file  Social History Narrative   ** Merged History Encounter **       Social Determinants of Health   Financial Resource Strain:   . Difficulty of Paying Living Expenses: Not on file  Food Insecurity:   . Worried About Charity fundraiser in the Last Year: Not on file  .  Ran Out of Food in the Last Year: Not on file  Transportation Needs:   . Lack of Transportation (Medical): Not on file  . Lack of Transportation (Non-Medical): Not on file  Physical Activity:   . Days of Exercise per Week: Not on file  . Minutes of Exercise per Session: Not on file  Stress:   . Feeling of Stress : Not on file  Social Connections:   . Frequency of Communication with Friends and Family: Not on file  . Frequency of Social Gatherings with Friends and Family: Not on file  . Attends Religious Services: Not on file  . Active Member of Clubs or Organizations: Not on file  . Attends Archivist Meetings: Not on file  . Marital Status: Not on file  Intimate Partner  Violence:   . Fear of Current or Ex-Partner: Not on file  . Emotionally Abused: Not on file  . Physically Abused: Not on file  . Sexually Abused: Not on file      Review of Systems  All other systems reviewed and are negative.      Objective:   Physical Exam Vitals reviewed.  Constitutional:      General: She is not in acute distress.    Appearance: She is well-developed. She is not diaphoretic.  HENT:     Ears:   Eyes:     Conjunctiva/sclera: Conjunctivae normal.     Pupils: Pupils are equal, round, and reactive to light.  Neck:     Thyroid: No thyromegaly.     Vascular: No JVD.  Cardiovascular:     Rate and Rhythm: Normal rate and regular rhythm.     Heart sounds: Normal heart sounds. No murmur. No friction rub. No gallop.   Pulmonary:     Effort: Pulmonary effort is normal. No respiratory distress.     Breath sounds: Normal breath sounds. No stridor. No wheezing or rales.  Lymphadenopathy:     Cervical: No cervical adenopathy.           Assessment & Plan:  Abnormal urine odor - Plan: Urinalysis, Routine w reflex microscopic  Frequent urination - Plan: Urinalysis, Routine w reflex microscopic  I suspect that the patient has overactive bladder syndrome based on her normal urinalysis.  We will try oxybutynin 5 mg at night prior to bed and if the patient see significant improvement, we could try switching her to a more expensive option that would be safer with fewer side effects such as Vesicare.  She will call me back in 1 to 2 weeks and give me an update on oxybutynin.  The erythematous scaly papule persist on the right helix.  The patient did not feel the dermatologist she saw evaluated her thoroughly.  She would like a second opinion but she prefers to wait until the summer until after the Covid pandemic.  I believe this is reasonable.  I suspect an actinic keratosis.

## 2020-01-22 ENCOUNTER — Other Ambulatory Visit: Payer: Self-pay | Admitting: Family Medicine

## 2020-01-24 ENCOUNTER — Other Ambulatory Visit: Payer: Self-pay | Admitting: *Deleted

## 2020-01-24 MED ORDER — PRAVASTATIN SODIUM 40 MG PO TABS: ORAL_TABLET | ORAL | 1 refills | Status: DC

## 2020-01-25 ENCOUNTER — Other Ambulatory Visit: Payer: Self-pay | Admitting: Family Medicine

## 2020-03-08 DIAGNOSIS — Z1231 Encounter for screening mammogram for malignant neoplasm of breast: Secondary | ICD-10-CM | POA: Diagnosis not present

## 2020-03-09 ENCOUNTER — Ambulatory Visit (INDEPENDENT_AMBULATORY_CARE_PROVIDER_SITE_OTHER): Payer: Medicare Other | Admitting: *Deleted

## 2020-03-09 ENCOUNTER — Telehealth: Payer: Self-pay | Admitting: *Deleted

## 2020-03-09 ENCOUNTER — Other Ambulatory Visit: Payer: Self-pay

## 2020-03-09 VITALS — BP 164/80

## 2020-03-09 DIAGNOSIS — I10 Essential (primary) hypertension: Secondary | ICD-10-CM

## 2020-03-09 MED ORDER — HYDROCHLOROTHIAZIDE 25 MG PO TABS: 25.0000 mg | ORAL_TABLET | Freq: Every day | ORAL | 1 refills | Status: DC

## 2020-03-09 NOTE — Progress Notes (Signed)
Patient seen in office to have BP checked.   States that she noted elevated BP on 03/08/2020 (>190). Reports that BP remained elevated this AM (>170). Patient did not bring her BP readings or monitor with her.   Checked BP in B arms. Readings are as follows: 160/76~ R arm 164/80~ L arm  Reports that she is currently taking Losartan 100mg  PO Q AM. Also states that she is taking HCTZ 25mg  after lunch if BP elevated. States that she has not been taking HCTZ regularly as her BP has been running 130-140's/ 60-70's.  States that she took medication at approximately 7:30am. Patient seen in office at approximately 9am.   PCP to be made aware and will await recommendations.

## 2020-03-09 NOTE — Telephone Encounter (Signed)
Call placed to patient. Sherri Leon.   Prescription sent to pharmacy.

## 2020-03-09 NOTE — Telephone Encounter (Signed)
Patient seen in office to have BP checked.   States that she noted elevated BP on 03/08/2020 (>190). Reports that BP remained elevated this AM (>170). Patient did not bring her BP readings or monitor with her.   Checked BP in B arms. Readings are as follows: 160/76~ R arm 164/80~ L arm  No HA, fatigue, chest pain, DOE noted or reported.   Reports that she is currently taking Losartan 100mg  PO Q AM. Also states that she is taking HCTZ 25mg  after lunch, but only if BP elevated. States that she has not been taking HCTZ regularly as her BP has been running 130-140's/ 60-70's.  States that she took medication at approximately 7:30am. Patient seen in office at approximately 9am.   MD please advise.

## 2020-03-09 NOTE — Telephone Encounter (Signed)
Resume hctz.

## 2020-03-10 ENCOUNTER — Telehealth: Payer: Self-pay | Admitting: Family Medicine

## 2020-03-10 NOTE — Telephone Encounter (Signed)
Patient calling to say that her bp is still 166/95 this morning, was here yesterday to get a bp check, please call back and advise  [phone]

## 2020-03-10 NOTE — Telephone Encounter (Signed)
Patient aware of providers recommendations via vm 

## 2020-03-10 NOTE — Telephone Encounter (Signed)
I recommended she resume hctz yesterday.  It will take 1 week to get in her system.

## 2020-03-23 ENCOUNTER — Ambulatory Visit (INDEPENDENT_AMBULATORY_CARE_PROVIDER_SITE_OTHER): Payer: Medicare HMO | Admitting: Family Medicine

## 2020-03-23 ENCOUNTER — Other Ambulatory Visit: Payer: Self-pay

## 2020-03-23 ENCOUNTER — Encounter: Payer: Self-pay | Admitting: Family Medicine

## 2020-03-23 VITALS — BP 110/64 | HR 64 | Temp 96.4°F | Resp 16 | Ht 63.0 in | Wt 166.0 lb

## 2020-03-23 DIAGNOSIS — E118 Type 2 diabetes mellitus with unspecified complications: Secondary | ICD-10-CM

## 2020-03-23 MED ORDER — TRIAMCINOLONE ACETONIDE 0.1 % EX CREA: 1.0000 "application " | TOPICAL_CREAM | Freq: Two times a day (BID) | CUTANEOUS | 0 refills | Status: DC

## 2020-03-23 NOTE — Progress Notes (Signed)
Subjective:    Patient ID: Sherri Leon, female    DOB: October 18, 1936, 84 y.o.   MRN: [id number]  Patient is here today for recheck of the lesion on her right ear.  I had sent her to see a dermatologist in [place].  She had liquid nitrogen cryotherapy.  She states that the dermatologist reassured her that it was nothing.  However after the cryotherapy the lesion is no better.  Is extremely tender.  Has white scale adherent to it.  In the crease just above it in the helix is brownish-yellow crusty discharge similar to cradle cap.  It is tender and sore.    Past Medical History:  Diagnosis Date  . Diabetes mellitus without complication ([place])   . Hyperlipidemia   . Hypertension   . Iron deficiency anemia    Past Surgical History:  Procedure Laterality Date  . ABDOMINAL HYSTERECTOMY    . ABDOMINAL HYSTERECTOMY  1970  . APPENDECTOMY  1970  . CARDIAC CATHETERIZATION N/A 08/23/2015   Procedure: Left Heart Cath;  Surgeon: Leonie Man, MD;  Location: [place] [facility];  Service: Cardiovascular;  Laterality: N/A;  . COLONOSCOPY WITH PROPOFOL N/A 06/13/2017   Procedure: COLONOSCOPY WITH PROPOFOL;  Surgeon: Manya Silvas, MD;  Location: [facility] ENDOSCOPY;  Service: Endoscopy;  Laterality: N/A;  . ESOPHAGOGASTRODUODENOSCOPY (EGD) WITH PROPOFOL N/A 06/13/2017   Procedure: ESOPHAGOGASTRODUODENOSCOPY (EGD) WITH PROPOFOL;  Surgeon: Manya Silvas, MD;  Location: [facility] ENDOSCOPY;  Service: Endoscopy;  Laterality: N/A;  . HERNIA REPAIR    . TONSILLECTOMY  1955   Current Outpatient Medications on File Prior to Visit  Medication Sig Dispense Refill  . aspirin EC 81 MG tablet Take 81 mg by mouth every other day.     . ferrous sulfate 325 (65 FE) MG tablet Take 325 mg by mouth every other day.    . hydrochlorothiazide (HYDRODIURIL) 25 MG tablet Take 1 tablet (25 mg total) by mouth daily. 90 tablet 1  . losartan (COZAAR) 100 MG tablet Take 1 tablet (100 mg total) by mouth daily. 90 tablet 3  .  metFORMIN (GLUCOPHAGE) 500 MG tablet Take 1 tablet (500 mg total) by mouth 2 (two) times daily with a meal. 180 tablet 3  . Multiple Vitamins-Minerals (MULTIVITAMIN ADULT PO) Take by mouth.    Glory Rosebush VERIO test strip CHECK BLOOD SUGAR EVERY DAY 100 strip 0  . oxybutynin (DITROPAN) 5 MG tablet Take 1 tablet (5 mg total) by mouth every 8 (eight) hours as needed for bladder spasms. 30 tablet 0  . pantoprazole (PROTONIX) 40 MG tablet Take 1 tablet (40 mg total) by mouth 2 (two) times daily. 60 tablet 10  . pravastatin (PRAVACHOL) 40 MG tablet TAKE 1 TABLET BY MOUTH EVERY DAY 90 tablet 1   No current facility-administered medications on file prior to visit.   No Known Allergies Social History   Socioeconomic History  . Marital status: Married    Spouse name: Not on file  . Number of children: Not on file  . Years of education: Not on file  . Highest education level: Not on file  Occupational History  . Not on file  Tobacco Use  . Smoking status: Never Smoker  . Smokeless tobacco: Never Used  Substance and Sexual Activity  . Alcohol use: No  . Drug use: No  . Sexual activity: Not Currently  Other Topics Concern  . Not on file  Social History Narrative   ** Merged History Encounter **  Social Determinants of Health   Financial Resource Strain:   . Difficulty of Paying Living Expenses:   Food Insecurity:   . Worried About Charity fundraiser in the Last Year:   . Arboriculturist in the Last Year:   Transportation Needs:   . Film/video editor (Medical):   Marland Kitchen Lack of Transportation (Non-Medical):   Physical Activity:   . Days of Exercise per Week:   . Minutes of Exercise per Session:   Stress:   . Feeling of Stress :   Social Connections:   . Frequency of Communication with Friends and Family:   . Frequency of Social Gatherings with Friends and Family:   . Attends Religious Services:   . Active Member of Clubs or Organizations:   . Attends Archivist  Meetings:   Marland Kitchen Marital Status:   Intimate Partner Violence:   . Fear of Current or Ex-Partner:   . Emotionally Abused:   Marland Kitchen Physically Abused:   . Sexually Abused:       Review of Systems  All other systems reviewed and are negative.      Objective:   Physical Exam Vitals reviewed.  Constitutional:      General: She is not in acute distress.    Appearance: She is well-developed. She is not diaphoretic.  HENT:     Ears:   Eyes:     Conjunctiva/sclera: Conjunctivae normal.     Pupils: Pupils are equal, round, and reactive to light.  Neck:     Thyroid: No thyromegaly.     Vascular: No JVD.  Cardiovascular:     Rate and Rhythm: Normal rate and regular rhythm.     Heart sounds: Normal heart sounds. No murmur. No friction rub. No gallop.   Pulmonary:     Effort: Pulmonary effort is normal. No respiratory distress.     Breath sounds: Normal breath sounds. No stridor. No wheezing or rales.  Lymphadenopathy:     Cervical: No cervical adenopathy.           Assessment & Plan:  Controlled type 2 diabetes mellitus with complication, without long-term current use of insulin ([place]) - Plan: Hemoglobin A1c, CBC with Differential/Platelet, COMPLETE METABOLIC PANEL WITH GFR, Ambulatory referral to Podiatry  Patient's diabetes is well controlled.  However she is due to recheck her hemoglobin A1c along with a CMP.  She like a referral to a podiatrist due to thick dystrophic yellow toenails that are difficult for her to trim.  I am concerned about the lesion on her helix.  Differential diagnosis would be squamous cell carcinoma versus actinic keratosis versus some type of psoriasis/eczema.  I will temporarily treated with triamcinolone cream twice daily if the lesions no better in 2 weeks I would recommend consultation with dermatology again for excisional biopsy.  Patient is comfortable with this plan.

## 2020-03-24 LAB — CBC WITH DIFFERENTIAL/PLATELET
Absolute Monocytes: 616 cells/uL (ref 200–950)
Basophils Absolute: 31 cells/uL (ref 0–200)
Basophils Relative: 0.4 %
Eosinophils Absolute: 108 cells/uL (ref 15–500)
Eosinophils Relative: 1.4 %
HCT: 39.5 % (ref 35.0–45.0)
Hemoglobin: 12.9 g/dL (ref 11.7–15.5)
Lymphs Abs: 2410 cells/uL (ref 850–3900)
MCH: 28 pg (ref 27.0–33.0)
MCHC: 32.7 g/dL (ref 32.0–36.0)
MCV: 85.9 fL (ref 80.0–100.0)
MPV: 12.2 fL (ref 7.5–12.5)
Monocytes Relative: 8 %
Neutro Abs: 4535 cells/uL (ref 1500–7800)
Neutrophils Relative %: 58.9 %
Platelets: 209 10*3/uL (ref 140–400)
RBC: 4.6 10*6/uL (ref 3.80–5.10)
RDW: 13 % (ref 11.0–15.0)
Total Lymphocyte: 31.3 %
WBC: 7.7 10*3/uL (ref 3.8–10.8)

## 2020-03-24 LAB — COMPLETE METABOLIC PANEL WITH GFR
AG Ratio: 2 (calc) (ref 1.0–2.5)
ALT: 14 U/L (ref 6–29)
AST: 17 U/L (ref 10–35)
Albumin: 4.3 g/dL (ref 3.6–5.1)
Alkaline phosphatase (APISO): 84 U/L (ref 37–153)
BUN: 15 mg/dL (ref 7–25)
CO2: 29 mmol/L (ref 20–32)
Calcium: 9.7 mg/dL (ref 8.6–10.4)
Chloride: 102 mmol/L (ref 98–110)
Creat: 0.78 mg/dL (ref 0.60–0.88)
GFR, Est African American: 82 mL/min/{1.73_m2} (ref >=60)
GFR, Est Non African American: 71 mL/min/{1.73_m2} (ref >=60)
Globulin: 2.2 g/dL (calc) (ref 1.9–3.7)
Glucose, Bld: 129 mg/dL — ABNORMAL HIGH (ref 65–99)
Potassium: 4 mmol/L (ref 3.5–5.3)
Sodium: 139 mmol/L (ref 135–146)
Total Bilirubin: 0.6 mg/dL (ref 0.2–1.2)
Total Protein: 6.5 g/dL (ref 6.1–8.1)

## 2020-03-24 LAB — HEMOGLOBIN A1C
Hgb A1c MFr Bld: 6.1 % of total Hgb — ABNORMAL HIGH (ref <5.7)
Mean Plasma Glucose: 128 (calc)
eAG (mmol/L): 7.1 (calc)

## 2020-03-31 DIAGNOSIS — H61031 Chondritis of right external ear: Secondary | ICD-10-CM | POA: Diagnosis not present

## 2020-04-06 ENCOUNTER — Ambulatory Visit: Payer: Medicare HMO | Admitting: Podiatry

## 2020-04-14 ENCOUNTER — Other Ambulatory Visit: Payer: Self-pay | Admitting: Family Medicine

## 2020-04-14 DIAGNOSIS — H61031 Chondritis of right external ear: Secondary | ICD-10-CM | POA: Diagnosis not present

## 2020-04-14 DIAGNOSIS — H6122 Impacted cerumen, left ear: Secondary | ICD-10-CM | POA: Diagnosis not present

## 2020-04-14 DIAGNOSIS — E041 Nontoxic single thyroid nodule: Secondary | ICD-10-CM | POA: Diagnosis not present

## 2020-04-19 ENCOUNTER — Other Ambulatory Visit: Payer: Self-pay | Admitting: Family Medicine

## 2020-04-19 NOTE — Telephone Encounter (Signed)
CB# [phone] Pt need  refill Onetouch Verio test strips

## 2020-04-20 MED ORDER — ONETOUCH VERIO VI STRP: ORAL_STRIP | 3 refills | Status: DC

## 2020-04-20 NOTE — Telephone Encounter (Signed)
Test Strips Prescription called in for [facility]

## 2020-04-25 ENCOUNTER — Other Ambulatory Visit: Payer: Self-pay

## 2020-04-25 MED ORDER — ONETOUCH VERIO VI STRP: ORAL_STRIP | 3 refills | Status: DC

## 2020-06-05 ENCOUNTER — Other Ambulatory Visit: Payer: Self-pay

## 2020-06-05 ENCOUNTER — Ambulatory Visit: Payer: Medicare HMO | Admitting: Podiatry

## 2020-06-05 ENCOUNTER — Encounter: Payer: Self-pay | Admitting: Podiatry

## 2020-06-05 DIAGNOSIS — M79675 Pain in left toe(s): Secondary | ICD-10-CM

## 2020-06-05 DIAGNOSIS — M79674 Pain in right toe(s): Secondary | ICD-10-CM

## 2020-06-05 DIAGNOSIS — B351 Tinea unguium: Secondary | ICD-10-CM | POA: Diagnosis not present

## 2020-06-05 DIAGNOSIS — E1149 Type 2 diabetes mellitus with other diabetic neurological complication: Secondary | ICD-10-CM | POA: Diagnosis not present

## 2020-06-05 DIAGNOSIS — E1140 Type 2 diabetes mellitus with diabetic neuropathy, unspecified: Secondary | ICD-10-CM

## 2020-06-07 NOTE — Progress Notes (Signed)
Subjective:   Patient ID: Sherri Leon, female   DOB: 84 y.o.   MRN: [id number]   HPI Patient presents with thick nailbeds 1-5 both feet being a long-term type II diabetic.  States she has had a big toenails removed in the past but they are still quite bothersome for her and she is wanting to know about possibility for removal   Review of Systems  All other systems reviewed and are negative.       Objective:  Physical Exam Vitals and nursing note reviewed.  Constitutional:      Appearance: She is well-developed.  Pulmonary:     Effort: Pulmonary effort is normal.  Musculoskeletal:        General: Normal range of motion.  Skin:    General: Skin is warm.  Neurological:     Mental Status: She is alert.     Neurovascular status found to be intact with PT pulses DP pulses intact mild diminishment neurological sensation and thick brittle nailbeds 1-5 both feet with severe dystrophic changes of the hallux nails bilateral.  Pain is noted in all nails and patient did have good digital perfusion     Assessment:  Mycotic nail infection 1-5 both feet with pain     Plan:  H&P form discussed condition debrided nailbeds 1-5 both feet with no iatrogenic bleeding and reappoint for routine care educated her on diabetic foot care and inspections

## 2020-06-28 ENCOUNTER — Telehealth: Payer: Self-pay | Admitting: Family Medicine

## 2020-06-28 NOTE — Progress Notes (Signed)
  Chronic Care Management   Note  06/28/2020 Name: SHAUN ZUCCARO MRN: [id number] DOB: 1936-03-06  Sherri Leon is a 84 y.o. year old female who is a primary care patient of Dennard Schaumann, Cammie Mcgee, MD. I reached out to Melchor Amour by phone today in response to a referral sent by [name]'s PCP, Susy Frizzle, MD.   [name] was given information about Chronic Care Management services today including:  1. CCM service includes personalized support from designated clinical staff supervised by her physician, including individualized plan of care and coordination with other care providers 2. 24/7 contact phone numbers for assistance for urgent and routine care needs. 3. Service will only be billed when office clinical staff spend 20 minutes or more in a month to coordinate care. 4. Only one practitioner may furnish and bill the service in a calendar month. 5. The patient may stop CCM services at any time (effective at the end of the month) by phone call to the office staff.   Patient wishes to consider information provided and/or speak with a member of the care team before deciding about enrollment in care management services.   Follow up plan:   Carley Perdue [employer] Scheduler

## 2020-07-06 ENCOUNTER — Ambulatory Visit (INDEPENDENT_AMBULATORY_CARE_PROVIDER_SITE_OTHER): Payer: Medicare HMO | Admitting: Family Medicine

## 2020-07-06 ENCOUNTER — Other Ambulatory Visit: Payer: Self-pay

## 2020-07-06 VITALS — BP 120/68 | HR 67 | Temp 97.6°F | Ht 63.0 in | Wt 160.0 lb

## 2020-07-06 DIAGNOSIS — S39012A Strain of muscle, fascia and tendon of lower back, initial encounter: Secondary | ICD-10-CM

## 2020-07-06 NOTE — Progress Notes (Signed)
Subjective:    Patient ID: Sherri Leon, female    DOB: Aug 30, 1936, 84 y.o.   MRN: [id number]  Patient was riding her golf cart.  She was also dragging a push mower to the side.  Something happened and the push mower got jerked causing her to twist while driving the golf cart.  She strained her lower back on the right side.  She denies any bowel or bladder incontinence.  She denies any saddle anesthesia.  She denies any leg weakness.  She denies any sciatica.  She denies any numbness or tingling in the legs.  She has normal reflexes at the patella and Achilles bilaterally.  She has normal sensation.  She has negative straight leg raise and no pain with palpation at the sciatic notch.  She does have some tenderness to palpation in the right paraspinal muscles roughly at the level of L5 Past Medical History:  Diagnosis Date  . Diabetes mellitus without complication ([place])   . Hyperlipidemia   . Hypertension   . Iron deficiency anemia    Past Surgical History:  Procedure Laterality Date  . ABDOMINAL HYSTERECTOMY    . ABDOMINAL HYSTERECTOMY  1970  . APPENDECTOMY  1970  . CARDIAC CATHETERIZATION N/A 08/23/2015   Procedure: Left Heart Cath;  Surgeon: Leonie Man, MD;  Location: [place] [facility];  Service: Cardiovascular;  Laterality: N/A;  . COLONOSCOPY WITH PROPOFOL N/A 06/13/2017   Procedure: COLONOSCOPY WITH PROPOFOL;  Surgeon: Manya Silvas, MD;  Location: [facility] ENDOSCOPY;  Service: Endoscopy;  Laterality: N/A;  . ESOPHAGOGASTRODUODENOSCOPY (EGD) WITH PROPOFOL N/A 06/13/2017   Procedure: ESOPHAGOGASTRODUODENOSCOPY (EGD) WITH PROPOFOL;  Surgeon: Manya Silvas, MD;  Location: [facility] ENDOSCOPY;  Service: Endoscopy;  Laterality: N/A;  . HERNIA REPAIR    . TONSILLECTOMY  1955   Current Outpatient Medications on File Prior to Visit  Medication Sig Dispense Refill  . aspirin EC 81 MG tablet Take 81 mg by mouth every other day.     . clobetasol cream (TEMOVATE) 0.05 %     . ferrous  sulfate 325 (65 FE) MG tablet Take 325 mg by mouth every other day.    Marland Kitchen glucose blood (ONETOUCH VERIO) test strip CHECK BLOOD SUGAR EVERY DAY 100 strip 3  . hydrochlorothiazide (HYDRODIURIL) 25 MG tablet Take 1 tablet (25 mg total) by mouth daily. 90 tablet 1  . losartan (COZAAR) 100 MG tablet Take 1 tablet (100 mg total) by mouth daily. 90 tablet 3  . metFORMIN (GLUCOPHAGE) 500 MG tablet Take 1 tablet (500 mg total) by mouth 2 (two) times daily with a meal. 180 tablet 3  . Multiple Vitamins-Minerals (MULTIVITAMIN ADULT PO) Take by mouth.    . oxybutynin (DITROPAN) 5 MG tablet Take 1 tablet (5 mg total) by mouth every 8 (eight) hours as needed for bladder spasms. 30 tablet 0  . pantoprazole (PROTONIX) 40 MG tablet Take 1 tablet (40 mg total) by mouth 2 (two) times daily. 60 tablet 10  . pravastatin (PRAVACHOL) 40 MG tablet TAKE 1 TABLET BY MOUTH EVERY DAY 90 tablet 1  . triamcinolone cream (KENALOG) 0.1 % Apply 1 application topically 2 (two) times daily. 30 g 0   No current facility-administered medications on file prior to visit.   No Known Allergies Social History   Socioeconomic History  . Marital status: Married    Spouse name: Not on file  . Number of children: Not on file  . Years of education: Not on file  .  Highest education level: Not on file  Occupational History  . Not on file  Tobacco Use  . Smoking status: Never Smoker  . Smokeless tobacco: Never Used  Substance and Sexual Activity  . Alcohol use: No  . Drug use: No  . Sexual activity: Not Currently  Other Topics Concern  . Not on file  Social History Narrative   ** Merged History Encounter **       Social Determinants of Health   Financial Resource Strain:   . Difficulty of Paying Living Expenses: Not on file  Food Insecurity:   . Worried About Charity fundraiser in the Last Year: Not on file  . Ran Out of Food in the Last Year: Not on file  Transportation Needs:   . Lack of Transportation (Medical): Not  on file  . Lack of Transportation (Non-Medical): Not on file  Physical Activity:   . Days of Exercise per Week: Not on file  . Minutes of Exercise per Session: Not on file  Stress:   . Feeling of Stress : Not on file  Social Connections:   . Frequency of Communication with Friends and Family: Not on file  . Frequency of Social Gatherings with Friends and Family: Not on file  . Attends Religious Services: Not on file  . Active Member of Clubs or Organizations: Not on file  . Attends Archivist Meetings: Not on file  . Marital Status: Not on file  Intimate Partner Violence:   . Fear of Current or Ex-Partner: Not on file  . Emotionally Abused: Not on file  . Physically Abused: Not on file  . Sexually Abused: Not on file      Review of Systems  All other systems reviewed and are negative.      Objective:   Physical Exam Vitals reviewed.  Constitutional:      General: She is not in acute distress.    Appearance: She is well-developed. She is not diaphoretic.  HENT:     Ears:   Eyes:     Conjunctiva/sclera: Conjunctivae normal.     Pupils: Pupils are equal, round, and reactive to light.  Neck:     Thyroid: No thyromegaly.     Vascular: No JVD.  Cardiovascular:     Rate and Rhythm: Normal rate and regular rhythm.     Heart sounds: Normal heart sounds. No murmur heard.  No friction rub. No gallop.   Pulmonary:     Effort: Pulmonary effort is normal. No respiratory distress.     Breath sounds: Normal breath sounds. No stridor. No wheezing or rales.  Musculoskeletal:     Lumbar back: Spasms and tenderness present. Decreased range of motion.       Back:  Lymphadenopathy:     Cervical: No cervical adenopathy.           Assessment & Plan:  Strain of lumbar region, initial encounter  Recommended heat, rest, and Tylenol 500 mg every 6 hours.  Patient states the pain is gradually getting better.  The lesion on her right helix is not healing.  I recommended  that she see the dermatologist as soon as possible.  He is recommended surgery to repair it.

## 2020-08-09 ENCOUNTER — Other Ambulatory Visit: Payer: Self-pay | Admitting: Family Medicine

## 2020-09-04 DIAGNOSIS — H6123 Impacted cerumen, bilateral: Secondary | ICD-10-CM | POA: Diagnosis not present

## 2020-09-04 DIAGNOSIS — E041 Nontoxic single thyroid nodule: Secondary | ICD-10-CM | POA: Diagnosis not present

## 2020-09-04 DIAGNOSIS — H61031 Chondritis of right external ear: Secondary | ICD-10-CM | POA: Diagnosis not present

## 2020-09-07 ENCOUNTER — Ambulatory Visit (INDEPENDENT_AMBULATORY_CARE_PROVIDER_SITE_OTHER): Payer: Medicare HMO

## 2020-09-07 ENCOUNTER — Other Ambulatory Visit: Payer: Self-pay

## 2020-09-07 DIAGNOSIS — Z23 Encounter for immunization: Secondary | ICD-10-CM

## 2020-09-26 ENCOUNTER — Ambulatory Visit (INDEPENDENT_AMBULATORY_CARE_PROVIDER_SITE_OTHER): Payer: Medicare HMO | Admitting: Family Medicine

## 2020-09-26 ENCOUNTER — Other Ambulatory Visit: Payer: Self-pay

## 2020-09-26 VITALS — BP 120/70 | HR 73 | Temp 97.7°F | Ht 63.0 in | Wt 162.0 lb

## 2020-09-26 DIAGNOSIS — R350 Frequency of micturition: Secondary | ICD-10-CM

## 2020-09-26 DIAGNOSIS — E118 Type 2 diabetes mellitus with unspecified complications: Secondary | ICD-10-CM

## 2020-09-26 DIAGNOSIS — R8290 Unspecified abnormal findings in urine: Secondary | ICD-10-CM | POA: Diagnosis not present

## 2020-09-26 DIAGNOSIS — D509 Iron deficiency anemia, unspecified: Secondary | ICD-10-CM | POA: Diagnosis not present

## 2020-09-26 LAB — URINALYSIS, ROUTINE W REFLEX MICROSCOPIC
Bilirubin Urine: NEGATIVE
Glucose, UA: NEGATIVE
Hgb urine dipstick: NEGATIVE
Leukocytes,Ua: NEGATIVE
Nitrite: NEGATIVE
Protein, ur: NEGATIVE
Specific Gravity, Urine: 1.023 (ref 1.001–1.03)
pH: 5.5 (ref 5.0–8.0)

## 2020-09-26 MED ORDER — FREESTYLE LITE TEST VI STRP
1.0000 | ORAL_STRIP | Freq: Every day | 12 refills | Status: DC
Start: 2020-09-26 — End: 2021-02-27

## 2020-09-26 NOTE — Progress Notes (Signed)
Subjective:    Patient ID: Sherri Leon, female    DOB: 1936/02/11, 84 y.o.   MRN: [id number]  Patient is here today due to increased urinary frequency.  She states that she is having to wake up every night 2 or 3 times to go to the bathroom.  She will feel the sudden urge to urinate.  She will go to the bathroom and void.  Shortly after completing and emptying her bladder, she will stand up and feel like she has to void again.  She denies any dysuria.  She denies any hematuria.  Urinalysis today is negative for nitrites, negative for leukocyte esterase, negative for blood, negative for sugar.  She reports that her blood sugars are between 140 and 120.  She denies any hypoglycemia.  She has a history of overactive bladder but she has not been taking her oxybutynin 5 mg p.o. nightly that we have prescribed in the past.  Past Medical History:  Diagnosis Date  . Diabetes mellitus without complication ([place])   . Hyperlipidemia   . Hypertension   . Iron deficiency anemia    Past Surgical History:  Procedure Laterality Date  . ABDOMINAL HYSTERECTOMY    . ABDOMINAL HYSTERECTOMY  1970  . APPENDECTOMY  1970  . CARDIAC CATHETERIZATION N/A 08/23/2015   Procedure: Left Heart Cath;  Surgeon: Leonie Man, MD;  Location: [place] [facility];  Service: Cardiovascular;  Laterality: N/A;  . COLONOSCOPY WITH PROPOFOL N/A 06/13/2017   Procedure: COLONOSCOPY WITH PROPOFOL;  Surgeon: Manya Silvas, MD;  Location: [facility] ENDOSCOPY;  Service: Endoscopy;  Laterality: N/A;  . ESOPHAGOGASTRODUODENOSCOPY (EGD) WITH PROPOFOL N/A 06/13/2017   Procedure: ESOPHAGOGASTRODUODENOSCOPY (EGD) WITH PROPOFOL;  Surgeon: Manya Silvas, MD;  Location: [facility] ENDOSCOPY;  Service: Endoscopy;  Laterality: N/A;  . HERNIA REPAIR    . TONSILLECTOMY  1955   Current Outpatient Medications on File Prior to Visit  Medication Sig Dispense Refill  . aspirin EC 81 MG tablet Take 81 mg by mouth every other day.     . clobetasol  cream (TEMOVATE) 0.05 %     . ferrous sulfate 325 (65 FE) MG tablet Take 325 mg by mouth every other day.    Marland Kitchen glucose blood (ONETOUCH VERIO) test strip CHECK BLOOD SUGAR EVERY DAY 100 strip 3  . hydrochlorothiazide (HYDRODIURIL) 25 MG tablet Take 1 tablet (25 mg total) by mouth daily. 90 tablet 1  . losartan (COZAAR) 100 MG tablet TAKE 1 TABLET(100 MG) BY MOUTH DAILY 90 tablet 3  . metFORMIN (GLUCOPHAGE) 500 MG tablet TAKE 1 TABLET(500 MG) BY MOUTH TWICE DAILY WITH A MEAL 180 tablet 3  . Multiple Vitamins-Minerals (MULTIVITAMIN ADULT PO) Take by mouth.    . oxybutynin (DITROPAN) 5 MG tablet Take 1 tablet (5 mg total) by mouth every 8 (eight) hours as needed for bladder spasms. 30 tablet 0  . pantoprazole (PROTONIX) 40 MG tablet Take 1 tablet (40 mg total) by mouth 2 (two) times daily. 60 tablet 10  . pravastatin (PRAVACHOL) 40 MG tablet TAKE 1 TABLET BY MOUTH EVERY DAY 90 tablet 1  . triamcinolone cream (KENALOG) 0.1 % Apply 1 application topically 2 (two) times daily. 30 g 0   No current facility-administered medications on file prior to visit.   No Known Allergies Social History   Socioeconomic History  . Marital status: Married    Spouse name: Not on file  . Number of children: Not on file  . Years of education: Not on  file  . Highest education level: Not on file  Occupational History  . Not on file  Tobacco Use  . Smoking status: Never Smoker  . Smokeless tobacco: Never Used  Substance and Sexual Activity  . Alcohol use: No  . Drug use: No  . Sexual activity: Not Currently  Other Topics Concern  . Not on file  Social History Narrative   ** Merged History Encounter **       Social Determinants of Health   Financial Resource Strain:   . Difficulty of Paying Living Expenses: Not on file  Food Insecurity:   . Worried About Charity fundraiser in the Last Year: Not on file  . Ran Out of Food in the Last Year: Not on file  Transportation Needs:   . Lack of Transportation  (Medical): Not on file  . Lack of Transportation (Non-Medical): Not on file  Physical Activity:   . Days of Exercise per Week: Not on file  . Minutes of Exercise per Session: Not on file  Stress:   . Feeling of Stress : Not on file  Social Connections:   . Frequency of Communication with Friends and Family: Not on file  . Frequency of Social Gatherings with Friends and Family: Not on file  . Attends Religious Services: Not on file  . Active Member of Clubs or Organizations: Not on file  . Attends Archivist Meetings: Not on file  . Marital Status: Not on file  Intimate Partner Violence:   . Fear of Current or Ex-Partner: Not on file  . Emotionally Abused: Not on file  . Physically Abused: Not on file  . Sexually Abused: Not on file      Review of Systems  All other systems reviewed and are negative.      Objective:   Physical Exam Vitals reviewed.  Constitutional:      General: She is not in acute distress.    Appearance: She is well-developed. She is not diaphoretic.  Eyes:     Conjunctiva/sclera: Conjunctivae normal.     Pupils: Pupils are equal, round, and reactive to light.  Neck:     Thyroid: No thyromegaly.     Vascular: No JVD.  Cardiovascular:     Rate and Rhythm: Normal rate and regular rhythm.     Heart sounds: Normal heart sounds. No murmur heard.  No friction rub. No gallop.   Pulmonary:     Effort: Pulmonary effort is normal. No respiratory distress.     Breath sounds: Normal breath sounds. No stridor. No wheezing or rales.  Lymphadenopathy:     Cervical: No cervical adenopathy.           Assessment & Plan:  Controlled type 2 diabetes mellitus with complication, without long-term current use of insulin ([place]) - Plan: Hemoglobin A1c, CBC with Differential/Platelet, COMPLETE METABOLIC PANEL WITH GFR, Lipid panel  Abnormal urine odor - Plan: Urinalysis, Routine w reflex microscopic, Urine Culture  Frequent urination - Plan: Urinalysis,  Routine w reflex microscopic, Urine Culture  While the patient is here and will check an A1c, CBC, CMP, and fasting lipid panel to monitor the management of her diabetes.  The last time we checked it was in May but at that time it was well controlled.  I believe her symptoms are most likely due to overactive bladder as her urinalysis is normal and her sugar is well controlled.  Therefore I will asked the patient to take oxybutynin 5 mg p.o.  nightly and see if the symptoms improve.  If not I will try Vesicare or Myrbetriq instead.  I explained to the patient that these medications are much safer and more effective however they are more expensive and she would like to try the cheaper option first.

## 2020-09-27 LAB — LIPID PANEL
Cholesterol: 140 mg/dL (ref <200)
HDL: 48 mg/dL — ABNORMAL LOW (ref >=50)
LDL Cholesterol (Calc): 71 mg/dL (calc)
Non-HDL Cholesterol (Calc): 92 mg/dL (calc) (ref <130)
Total CHOL/HDL Ratio: 2.9 (calc) (ref <5.0)
Triglycerides: 131 mg/dL (ref <150)

## 2020-09-27 LAB — CBC WITH DIFFERENTIAL/PLATELET
Absolute Monocytes: 360 cells/uL (ref 200–950)
Basophils Absolute: 32 cells/uL (ref 0–200)
Basophils Relative: 0.6 %
Eosinophils Absolute: 80 cells/uL (ref 15–500)
Eosinophils Relative: 1.5 %
HCT: 34.7 % — ABNORMAL LOW (ref 35.0–45.0)
Hemoglobin: 10.9 g/dL — ABNORMAL LOW (ref 11.7–15.5)
Lymphs Abs: 2078 cells/uL (ref 850–3900)
MCH: 26.3 pg — ABNORMAL LOW (ref 27.0–33.0)
MCHC: 31.4 g/dL — ABNORMAL LOW (ref 32.0–36.0)
MCV: 83.6 fL (ref 80.0–100.0)
MPV: 12.2 fL (ref 7.5–12.5)
Monocytes Relative: 6.8 %
Neutro Abs: 2751 cells/uL (ref 1500–7800)
Neutrophils Relative %: 51.9 %
Platelets: 215 10*3/uL (ref 140–400)
RBC: 4.15 10*6/uL (ref 3.80–5.10)
RDW: 13.1 % (ref 11.0–15.0)
Total Lymphocyte: 39.2 %
WBC: 5.3 10*3/uL (ref 3.8–10.8)

## 2020-09-27 LAB — COMPLETE METABOLIC PANEL WITH GFR
AG Ratio: 1.8 (calc) (ref 1.0–2.5)
ALT: 14 U/L (ref 6–29)
AST: 17 U/L (ref 10–35)
Albumin: 3.8 g/dL (ref 3.6–5.1)
Alkaline phosphatase (APISO): 72 U/L (ref 37–153)
BUN: 12 mg/dL (ref 7–25)
CO2: 28 mmol/L (ref 20–32)
Calcium: 9.6 mg/dL (ref 8.6–10.4)
Chloride: 109 mmol/L (ref 98–110)
Creat: 0.79 mg/dL (ref 0.60–0.88)
GFR, Est African American: 81 mL/min/{1.73_m2} (ref >=60)
GFR, Est Non African American: 70 mL/min/{1.73_m2} (ref >=60)
Globulin: 2.1 g/dL (calc) (ref 1.9–3.7)
Glucose, Bld: 145 mg/dL — ABNORMAL HIGH (ref 65–99)
Potassium: 4.6 mmol/L (ref 3.5–5.3)
Sodium: 142 mmol/L (ref 135–146)
Total Bilirubin: 0.4 mg/dL (ref 0.2–1.2)
Total Protein: 5.9 g/dL — ABNORMAL LOW (ref 6.1–8.1)

## 2020-09-27 LAB — URINE CULTURE
MICRO NUMBER:: 11179492
Result:: NO GROWTH
SPECIMEN QUALITY:: ADEQUATE

## 2020-09-27 LAB — HEMOGLOBIN A1C
Hgb A1c MFr Bld: 6.2 % of total Hgb — ABNORMAL HIGH (ref <5.7)
Mean Plasma Glucose: 131 (calc)
eAG (mmol/L): 7.3 (calc)

## 2020-09-28 ENCOUNTER — Telehealth: Payer: Self-pay

## 2020-09-28 ENCOUNTER — Other Ambulatory Visit: Payer: Self-pay

## 2020-09-28 DIAGNOSIS — D509 Iron deficiency anemia, unspecified: Secondary | ICD-10-CM

## 2020-09-28 NOTE — Addendum Note (Signed)
Addended by: Saundra Shelling on: 09/28/2020 09:41 AM   Modules accepted: Orders

## 2020-09-28 NOTE — Telephone Encounter (Signed)
Spoke with pt regarding Lab results

## 2020-10-04 ENCOUNTER — Other Ambulatory Visit: Payer: Self-pay | Admitting: Otolaryngology

## 2020-10-04 DIAGNOSIS — L859 Epidermal thickening, unspecified: Secondary | ICD-10-CM | POA: Diagnosis not present

## 2020-10-04 DIAGNOSIS — D210 Benign neoplasm of connective and other soft tissue of head, face and neck: Secondary | ICD-10-CM | POA: Diagnosis not present

## 2020-10-05 ENCOUNTER — Ambulatory Visit: Payer: Medicare HMO | Admitting: Family Medicine

## 2020-10-06 LAB — SURGICAL PATHOLOGY

## 2020-10-16 ENCOUNTER — Other Ambulatory Visit: Payer: Self-pay

## 2020-10-16 ENCOUNTER — Ambulatory Visit (INDEPENDENT_AMBULATORY_CARE_PROVIDER_SITE_OTHER): Payer: Medicare HMO | Admitting: Family Medicine

## 2020-10-16 VITALS — BP 120/80 | HR 71 | Temp 97.7°F | Ht 63.0 in | Wt 162.0 lb

## 2020-10-16 DIAGNOSIS — R6889 Other general symptoms and signs: Secondary | ICD-10-CM | POA: Diagnosis not present

## 2020-10-16 DIAGNOSIS — D509 Iron deficiency anemia, unspecified: Secondary | ICD-10-CM | POA: Diagnosis not present

## 2020-10-16 DIAGNOSIS — D539 Nutritional anemia, unspecified: Secondary | ICD-10-CM | POA: Diagnosis not present

## 2020-10-16 NOTE — Progress Notes (Signed)
Subjective:    Patient ID: Sherri Leon, female    DOB: 1936-07-07, 84 y.o.   MRN: [id number]   On the patient's last lab work, her hemoglobin was shown to have fallen from 12.9 in May to 10.9 in November.  She denies any melena or hematochezia or hematemesis or hematuria.  She denies any abdominal pain.  She states that she does have a history of iron deficiency anemia but she has not been taking any iron.  She has not had a colonoscopy or any type of endoscopy in years.  She is here today for further work-up  Past Medical History:  Diagnosis Date  . Diabetes mellitus without complication ([place])   . Hyperlipidemia   . Hypertension   . Iron deficiency anemia    Past Surgical History:  Procedure Laterality Date  . ABDOMINAL HYSTERECTOMY    . ABDOMINAL HYSTERECTOMY  1970  . APPENDECTOMY  1970  . CARDIAC CATHETERIZATION N/A 08/23/2015   Procedure: Left Heart Cath;  Surgeon: Leonie Man, MD;  Location: [place] [facility];  Service: Cardiovascular;  Laterality: N/A;  . COLONOSCOPY WITH PROPOFOL N/A 06/13/2017   Procedure: COLONOSCOPY WITH PROPOFOL;  Surgeon: Manya Silvas, MD;  Location: [facility] ENDOSCOPY;  Service: Endoscopy;  Laterality: N/A;  . ESOPHAGOGASTRODUODENOSCOPY (EGD) WITH PROPOFOL N/A 06/13/2017   Procedure: ESOPHAGOGASTRODUODENOSCOPY (EGD) WITH PROPOFOL;  Surgeon: Manya Silvas, MD;  Location: [facility] ENDOSCOPY;  Service: Endoscopy;  Laterality: N/A;  . HERNIA REPAIR    . TONSILLECTOMY  1955   Current Outpatient Medications on File Prior to Visit  Medication Sig Dispense Refill  . aspirin EC 81 MG tablet Take 81 mg by mouth every other day.     . clobetasol cream (TEMOVATE) 0.05 %     . ferrous sulfate 325 (65 FE) MG tablet Take 325 mg by mouth every other day.    Marland Kitchen glucose blood (FREESTYLE LITE) test strip 1 each by Other route daily. Use as instructed 50 each 12  . glucose blood (ONETOUCH VERIO) test strip CHECK BLOOD SUGAR EVERY DAY 100 strip 3  .  hydrochlorothiazide (HYDRODIURIL) 25 MG tablet Take 1 tablet (25 mg total) by mouth daily. 90 tablet 1  . losartan (COZAAR) 100 MG tablet TAKE 1 TABLET(100 MG) BY MOUTH DAILY 90 tablet 3  . metFORMIN (GLUCOPHAGE) 500 MG tablet TAKE 1 TABLET(500 MG) BY MOUTH TWICE DAILY WITH A MEAL 180 tablet 3  . Multiple Vitamins-Minerals (MULTIVITAMIN ADULT PO) Take by mouth.    . oxybutynin (DITROPAN) 5 MG tablet Take 1 tablet (5 mg total) by mouth every 8 (eight) hours as needed for bladder spasms. 30 tablet 0  . pantoprazole (PROTONIX) 40 MG tablet Take 1 tablet (40 mg total) by mouth 2 (two) times daily. 60 tablet 10  . pravastatin (PRAVACHOL) 40 MG tablet TAKE 1 TABLET BY MOUTH EVERY DAY 90 tablet 1  . triamcinolone cream (KENALOG) 0.1 % Apply 1 application topically 2 (two) times daily. 30 g 0   No current facility-administered medications on file prior to visit.   No Known Allergies Social History   Socioeconomic History  . Marital status: Married    Spouse name: Not on file  . Number of children: Not on file  . Years of education: Not on file  . Highest education level: Not on file  Occupational History  . Not on file  Tobacco Use  . Smoking status: Never Smoker  . Smokeless tobacco: Never Used  Substance and Sexual Activity  .  Alcohol use: No  . Drug use: No  . Sexual activity: Not Currently  Other Topics Concern  . Not on file  Social History Narrative   ** Merged History Encounter **       Social Determinants of Health   Financial Resource Strain:   . Difficulty of Paying Living Expenses: Not on file  Food Insecurity:   . Worried About Charity fundraiser in the Last Year: Not on file  . Ran Out of Food in the Last Year: Not on file  Transportation Needs:   . Lack of Transportation (Medical): Not on file  . Lack of Transportation (Non-Medical): Not on file  Physical Activity:   . Days of Exercise per Week: Not on file  . Minutes of Exercise per Session: Not on file   Stress:   . Feeling of Stress : Not on file  Social Connections:   . Frequency of Communication with Friends and Family: Not on file  . Frequency of Social Gatherings with Friends and Family: Not on file  . Attends Religious Services: Not on file  . Active Member of Clubs or Organizations: Not on file  . Attends Archivist Meetings: Not on file  . Marital Status: Not on file  Intimate Partner Violence:   . Fear of Current or Ex-Partner: Not on file  . Emotionally Abused: Not on file  . Physically Abused: Not on file  . Sexually Abused: Not on file      Review of Systems  All other systems reviewed and are negative.      Objective:   Physical Exam Vitals reviewed.  Constitutional:      General: She is not in acute distress.    Appearance: She is well-developed. She is not diaphoretic.  Eyes:     Conjunctiva/sclera: Conjunctivae normal.     Pupils: Pupils are equal, round, and reactive to light.  Neck:     Thyroid: No thyromegaly.     Vascular: No JVD.  Cardiovascular:     Rate and Rhythm: Normal rate and regular rhythm.     Heart sounds: Normal heart sounds. No murmur heard.  No friction rub. No gallop.   Pulmonary:     Effort: Pulmonary effort is normal. No respiratory distress.     Breath sounds: Normal breath sounds. No stridor. No wheezing or rales.  Lymphadenopathy:     Cervical: No cervical adenopathy.           Assessment & Plan:  Iron deficiency anemia, unspecified iron deficiency anemia type - Plan: CBC with Differential/Platelet, Anemia panel, Vitamin B12, Fecal Globin By Immunochemistry  Begin by repeating a hemoglobin to see if the lab was correct.  Also check an iron level, ferritin level, and a B12 level.  Check her stool for blood.  If there is blood in her stool, I would recommend a GI consultation.

## 2020-10-17 ENCOUNTER — Other Ambulatory Visit: Payer: Self-pay

## 2020-10-17 DIAGNOSIS — D509 Iron deficiency anemia, unspecified: Secondary | ICD-10-CM

## 2020-10-17 LAB — CBC WITH DIFFERENTIAL/PLATELET
Absolute Monocytes: 447 cells/uL (ref 200–950)
Basophils Absolute: 17 cells/uL (ref 0–200)
Basophils Relative: 0.3 %
Eosinophils Absolute: 110 cells/uL (ref 15–500)
Eosinophils Relative: 1.9 %
HCT: 33.8 % — ABNORMAL LOW (ref 35.0–45.0)
Hemoglobin: 11.1 g/dL — ABNORMAL LOW (ref 11.7–15.5)
Lymphs Abs: 1879 cells/uL (ref 850–3900)
MCH: 26.2 pg — ABNORMAL LOW (ref 27.0–33.0)
MCHC: 32.8 g/dL (ref 32.0–36.0)
MCV: 79.9 fL — ABNORMAL LOW (ref 80.0–100.0)
MPV: 12.3 fL (ref 7.5–12.5)
Monocytes Relative: 7.7 %
Neutro Abs: 3347 cells/uL (ref 1500–7800)
Neutrophils Relative %: 57.7 %
Platelets: 227 10*3/uL (ref 140–400)
RBC: 4.23 10*6/uL (ref 3.80–5.10)
RDW: 13.4 % (ref 11.0–15.0)
Total Lymphocyte: 32.4 %
WBC: 5.8 10*3/uL (ref 3.8–10.8)

## 2020-10-17 LAB — IRON,TIBC AND FERRITIN PANEL
%SAT: 5 % (calc) — ABNORMAL LOW (ref 16–45)
Ferritin: 5 ng/mL — ABNORMAL LOW (ref 16–288)
Iron: 20 ug/dL — ABNORMAL LOW (ref 45–160)
TIBC: 405 mcg/dL (calc) (ref 250–450)

## 2020-10-17 LAB — VITAMIN B12: Vitamin B-12: 298 pg/mL (ref 200–1100)

## 2020-10-17 LAB — RETICULOCYTES
ABS Retic: 34720 cells/uL (ref 20000–8000)
Retic Ct Pct: 0.8 %

## 2020-10-17 LAB — FOLATE: Folate: 22.4 ng/mL

## 2020-10-17 MED ORDER — FERROUS SULFATE 325 (65 FE) MG PO TABS: 325.0000 mg | ORAL_TABLET | Freq: Every day | ORAL | 1 refills | Status: DC

## 2020-10-18 LAB — FECAL GLOBIN BY IMMUNOCHEMISTRY
FECAL GLOBIN RESULT:: NOT DETECTED
MICRO NUMBER:: 11257358
SPECIMEN QUALITY:: ADEQUATE

## 2020-10-30 ENCOUNTER — Other Ambulatory Visit: Payer: Self-pay | Admitting: Family Medicine

## 2020-10-30 DIAGNOSIS — R1319 Other dysphagia: Secondary | ICD-10-CM

## 2020-11-27 DIAGNOSIS — E113393 Type 2 diabetes mellitus with moderate nonproliferative diabetic retinopathy without macular edema, bilateral: Secondary | ICD-10-CM | POA: Diagnosis not present

## 2020-11-27 LAB — HM DIABETES EYE EXAM

## 2020-12-12 ENCOUNTER — Other Ambulatory Visit: Payer: Self-pay

## 2020-12-12 ENCOUNTER — Ambulatory Visit (INDEPENDENT_AMBULATORY_CARE_PROVIDER_SITE_OTHER): Payer: Medicare Other | Admitting: Family Medicine

## 2020-12-12 VITALS — BP 120/76 | HR 80 | Temp 98.6°F

## 2020-12-12 DIAGNOSIS — D509 Iron deficiency anemia, unspecified: Secondary | ICD-10-CM

## 2020-12-12 DIAGNOSIS — D539 Nutritional anemia, unspecified: Secondary | ICD-10-CM | POA: Diagnosis not present

## 2020-12-12 DIAGNOSIS — R6889 Other general symptoms and signs: Secondary | ICD-10-CM | POA: Diagnosis not present

## 2020-12-12 NOTE — Progress Notes (Signed)
Subjective:    Patient ID: Sherri Leon, female    DOB: 1936/10/13, 85 y.o.   MRN: [id number]  10/16/20 On the patient's last lab work, her hemoglobin was shown to have fallen from 12.9 in May to 10.9 in November.  She denies any melena or hematochezia or hematemesis or hematuria.  She denies any abdominal pain.  She states that she does have a history of iron deficiency anemia but she has not been taking any iron.  She has not had a colonoscopy or any type of endoscopy in years.  She is here today for further work-up.  AT that time, my plan was:  Begin by repeating a hemoglobin to see if the lab was correct.  Also check an iron level, ferritin level, and a B12 level.  Check her stool for blood.  If there is blood in her stool, I would recommend a GI consultation.  12/12/20 Patient has been taking iron every day in addition to 1000 mg of vitamin B12.  Her stool sample on November 29 was negative for any blood.  She denies seeing any hematochezia.  She denies any abdominal pain or weight loss or nausea or vomiting or fever or chills.  She is here today simply to recheck her hemoglobin.  Past Medical History:  Diagnosis Date  . Diabetes mellitus without complication ([place])   . Hyperlipidemia   . Hypertension   . Iron deficiency anemia    Past Surgical History:  Procedure Laterality Date  . ABDOMINAL HYSTERECTOMY    . ABDOMINAL HYSTERECTOMY  1970  . APPENDECTOMY  1970  . CARDIAC CATHETERIZATION N/A 08/23/2015   Procedure: Left Heart Cath;  Surgeon: Leonie Man, MD;  Location: [place] [facility];  Service: Cardiovascular;  Laterality: N/A;  . COLONOSCOPY WITH PROPOFOL N/A 06/13/2017   Procedure: COLONOSCOPY WITH PROPOFOL;  Surgeon: Manya Silvas, MD;  Location: [facility] ENDOSCOPY;  Service: Endoscopy;  Laterality: N/A;  . ESOPHAGOGASTRODUODENOSCOPY (EGD) WITH PROPOFOL N/A 06/13/2017   Procedure: ESOPHAGOGASTRODUODENOSCOPY (EGD) WITH PROPOFOL;  Surgeon: Manya Silvas, MD;  Location:  [facility] ENDOSCOPY;  Service: Endoscopy;  Laterality: N/A;  . HERNIA REPAIR    . TONSILLECTOMY  1955   Current Outpatient Medications on File Prior to Visit  Medication Sig Dispense Refill  . aspirin EC 81 MG tablet Take 81 mg by mouth every other day.     . clobetasol cream (TEMOVATE) 0.05 %     . ferrous sulfate 325 (65 FE) MG tablet Take 325 mg by mouth every other day. (Patient not taking: Reported on 10/16/2020)    . ferrous sulfate 325 (65 FE) MG tablet Take 1 tablet (325 mg total) by mouth daily with breakfast. 90 tablet 1  . glucose blood (FREESTYLE LITE) test strip 1 each by Other route daily. Use as instructed 50 each 12  . glucose blood (ONETOUCH VERIO) test strip CHECK BLOOD SUGAR EVERY DAY 100 strip 3  . hydrochlorothiazide (HYDRODIURIL) 25 MG tablet Take 1 tablet (25 mg total) by mouth daily. (Patient not taking: Reported on 10/16/2020) 90 tablet 1  . losartan (COZAAR) 100 MG tablet TAKE 1 TABLET(100 MG) BY MOUTH DAILY 90 tablet 3  . metFORMIN (GLUCOPHAGE) 500 MG tablet TAKE 1 TABLET(500 MG) BY MOUTH TWICE DAILY WITH A MEAL 180 tablet 3  . Multiple Vitamins-Minerals (MULTIVITAMIN ADULT PO) Take by mouth.    . oxybutynin (DITROPAN) 5 MG tablet Take 1 tablet (5 mg total) by mouth every 8 (eight) hours as needed for  bladder spasms. (Patient not taking: Reported on 10/16/2020) 30 tablet 0  . pantoprazole (PROTONIX) 40 MG tablet TAKE 1 TABLET(40 MG) BY MOUTH TWICE DAILY 60 tablet 10  . pantoprazole (PROTONIX) 40 MG tablet TAKE 1 TABLET(40 MG) BY MOUTH TWICE DAILY 60 tablet 10  . pravastatin (PRAVACHOL) 40 MG tablet TAKE 1 TABLET BY MOUTH EVERY DAY 90 tablet 1  . triamcinolone cream (KENALOG) 0.1 % Apply 1 application topically 2 (two) times daily. (Patient not taking: Reported on 10/16/2020) 30 g 0   No current facility-administered medications on file prior to visit.   No Known Allergies Social History   Socioeconomic History  . Marital status: Married    Spouse name: Not on file   . Number of children: Not on file  . Years of education: Not on file  . Highest education level: Not on file  Occupational History  . Not on file  Tobacco Use  . Smoking status: Never Smoker  . Smokeless tobacco: Never Used  Substance and Sexual Activity  . Alcohol use: No  . Drug use: No  . Sexual activity: Not Currently  Other Topics Concern  . Not on file  Social History Narrative   ** Merged History Encounter **       Social Determinants of Health   Financial Resource Strain: Not on file  Food Insecurity: Not on file  Transportation Needs: Not on file  Physical Activity: Not on file  Stress: Not on file  Social Connections: Not on file  Intimate Partner Violence: Not on file      Review of Systems  All other systems reviewed and are negative.      Objective:   Physical Exam Vitals reviewed.  Constitutional:      General: She is not in acute distress.    Appearance: She is well-developed. She is not diaphoretic.  Eyes:     Conjunctiva/sclera: Conjunctivae normal.     Pupils: Pupils are equal, round, and reactive to light.  Neck:     Thyroid: No thyromegaly.     Vascular: No JVD.  Cardiovascular:     Rate and Rhythm: Normal rate and regular rhythm.     Heart sounds: Normal heart sounds. No murmur heard. No friction rub. No gallop.   Pulmonary:     Effort: Pulmonary effort is normal. No respiratory distress.     Breath sounds: Normal breath sounds. No stridor. No wheezing or rales.  Lymphadenopathy:     Cervical: No cervical adenopathy.           Assessment & Plan:  Iron deficiency anemia, unspecified iron deficiency anemia type - Plan: CBC with Differential/Platelet, Anemia panel, BASIC METABOLIC PANEL WITH GFR, Iron, TIBC and Ferritin Panel, Reticulocytes, Vitamin B12, Folate Repeat CBC and anemia panel.  If hemoglobin has improved and iron is better, she can stop B12 and iron supplementation.  If hemoglobin continues to drop I would recommend GI  consultation.

## 2020-12-13 LAB — CBC WITH DIFFERENTIAL/PLATELET
Absolute Monocytes: 433 cells/uL (ref 200–950)
Basophils Absolute: 23 cells/uL (ref 0–200)
Basophils Relative: 0.4 %
Eosinophils Absolute: 80 cells/uL (ref 15–500)
Eosinophils Relative: 1.4 %
HCT: 39.7 % (ref 35.0–45.0)
Hemoglobin: 12.9 g/dL (ref 11.7–15.5)
Lymphs Abs: 2126 cells/uL (ref 850–3900)
MCH: 27 pg (ref 27.0–33.0)
MCHC: 32.5 g/dL (ref 32.0–36.0)
MCV: 83.1 fL (ref 80.0–100.0)
MPV: 12.5 fL (ref 7.5–12.5)
Monocytes Relative: 7.6 %
Neutro Abs: 3038 cells/uL (ref 1500–7800)
Neutrophils Relative %: 53.3 %
Platelets: 204 10*3/uL (ref 140–400)
RBC: 4.78 10*6/uL (ref 3.80–5.10)
RDW: 15.5 % — ABNORMAL HIGH (ref 11.0–15.0)
Total Lymphocyte: 37.3 %
WBC: 5.7 10*3/uL (ref 3.8–10.8)

## 2020-12-13 LAB — BASIC METABOLIC PANEL WITH GFR
BUN: 17 mg/dL (ref 7–25)
CO2: 24 mmol/L (ref 20–32)
Calcium: 9.9 mg/dL (ref 8.6–10.4)
Chloride: 106 mmol/L (ref 98–110)
Creat: 0.77 mg/dL (ref 0.60–0.88)
GFR, Est African American: 83 mL/min/{1.73_m2} (ref >=60)
GFR, Est Non African American: 72 mL/min/{1.73_m2} (ref >=60)
Glucose, Bld: 95 mg/dL (ref 65–99)
Potassium: 4.5 mmol/L (ref 3.5–5.3)
Sodium: 141 mmol/L (ref 135–146)

## 2020-12-13 LAB — IRON,TIBC AND FERRITIN PANEL
%SAT: 47 % (calc) — ABNORMAL HIGH (ref 16–45)
Ferritin: 12 ng/mL — ABNORMAL LOW (ref 16–288)
Iron: 166 ug/dL — ABNORMAL HIGH (ref 45–160)
TIBC: 356 mcg/dL (calc) (ref 250–450)

## 2020-12-13 LAB — FOLATE: Folate: >24 ng/mL

## 2020-12-13 LAB — RETICULOCYTES
ABS Retic: 37840 cells/uL (ref 20000–80000)
Retic Ct Pct: 0.8 %

## 2020-12-13 LAB — VITAMIN B12: Vitamin B-12: 489 pg/mL (ref 200–1100)

## 2021-01-22 ENCOUNTER — Other Ambulatory Visit: Payer: Self-pay | Admitting: Family Medicine

## 2021-02-27 ENCOUNTER — Ambulatory Visit (INDEPENDENT_AMBULATORY_CARE_PROVIDER_SITE_OTHER): Payer: Medicare Other | Admitting: Family Medicine

## 2021-02-27 ENCOUNTER — Encounter: Payer: Self-pay | Admitting: Family Medicine

## 2021-02-27 ENCOUNTER — Other Ambulatory Visit: Payer: Self-pay

## 2021-02-27 VITALS — BP 126/78 | HR 84 | Temp 98.6°F | Resp 14 | Ht 63.0 in | Wt 164.0 lb

## 2021-02-27 DIAGNOSIS — M5431 Sciatica, right side: Secondary | ICD-10-CM

## 2021-02-27 DIAGNOSIS — D509 Iron deficiency anemia, unspecified: Secondary | ICD-10-CM | POA: Diagnosis not present

## 2021-02-27 MED ORDER — PREDNISONE 20 MG PO TABS: ORAL_TABLET | ORAL | 0 refills | Status: DC

## 2021-02-27 NOTE — Progress Notes (Signed)
Subjective:    Patient ID: Sherri Leon, female    DOB: 25-Nov-1935, 85 y.o.   MRN: [id number]  Patient is a very sweet 85 year old Caucasian female who presents with pain in her right gluteus for approximately 1 month.  She states that whenever she sits, she has a burning pain that feels like she sitting on a hot coal right around the ischial tuberosity.  Is a neuropathic pain for certain.  However she is also having low back pain.  She states that whenever she stands for prolonged period of time her lower back will start to hurt and she will feel pain down her right posterior hip as well.  The pain does not extend further than her posterior right thigh.  She denies any numbness or tingling radiating down her leg into her foot.  She denies any leg weakness.  She denies any recent falls or injuries.  She has bilateral negative straight leg raises today.  Reflexes are diminished bilaterally as I am unable to distract the patient on her exam and she is tensing up her muscles.  Past Medical History:  Diagnosis Date  . Diabetes mellitus without complication ([place])   . Hyperlipidemia   . Hypertension   . Iron deficiency anemia    Past Surgical History:  Procedure Laterality Date  . ABDOMINAL HYSTERECTOMY    . ABDOMINAL HYSTERECTOMY  1970  . APPENDECTOMY  1970  . CARDIAC CATHETERIZATION N/A 08/23/2015   Procedure: Left Heart Cath;  Surgeon: Leonie Man, MD;  Location: [place] [facility];  Service: Cardiovascular;  Laterality: N/A;  . COLONOSCOPY WITH PROPOFOL N/A 06/13/2017   Procedure: COLONOSCOPY WITH PROPOFOL;  Surgeon: Manya Silvas, MD;  Location: [facility] ENDOSCOPY;  Service: Endoscopy;  Laterality: N/A;  . ESOPHAGOGASTRODUODENOSCOPY (EGD) WITH PROPOFOL N/A 06/13/2017   Procedure: ESOPHAGOGASTRODUODENOSCOPY (EGD) WITH PROPOFOL;  Surgeon: Manya Silvas, MD;  Location: [facility] ENDOSCOPY;  Service: Endoscopy;  Laterality: N/A;  . HERNIA REPAIR    . TONSILLECTOMY  1955   Current  Outpatient Medications on File Prior to Visit  Medication Sig Dispense Refill  . aspirin EC 81 MG tablet Take 81 mg by mouth every other day.     . clobetasol cream (TEMOVATE) 0.05 %     . ferrous sulfate 325 (65 FE) MG tablet Take 325 mg by mouth every other day. (Patient not taking: Reported on 10/16/2020)    . ferrous sulfate 325 (65 FE) MG tablet Take 1 tablet (325 mg total) by mouth daily with breakfast. 90 tablet 1  . glucose blood (FREESTYLE LITE) test strip 1 each by Other route daily. Use as instructed 50 each 12  . glucose blood (ONETOUCH VERIO) test strip CHECK BLOOD SUGAR EVERY DAY 100 strip 3  . hydrochlorothiazide (HYDRODIURIL) 25 MG tablet Take 1 tablet (25 mg total) by mouth daily. (Patient not taking: Reported on 10/16/2020) 90 tablet 1  . losartan (COZAAR) 100 MG tablet TAKE 1 TABLET(100 MG) BY MOUTH DAILY 90 tablet 3  . metFORMIN (GLUCOPHAGE) 500 MG tablet TAKE 1 TABLET(500 MG) BY MOUTH TWICE DAILY WITH A MEAL 180 tablet 3  . Multiple Vitamins-Minerals (MULTIVITAMIN ADULT PO) Take by mouth.    . oxybutynin (DITROPAN) 5 MG tablet Take 1 tablet (5 mg total) by mouth every 8 (eight) hours as needed for bladder spasms. (Patient not taking: Reported on 10/16/2020) 30 tablet 0  . pantoprazole (PROTONIX) 40 MG tablet TAKE 1 TABLET(40 MG) BY MOUTH TWICE DAILY 60 tablet 10  .  pantoprazole (PROTONIX) 40 MG tablet TAKE 1 TABLET(40 MG) BY MOUTH TWICE DAILY 60 tablet 10  . pravastatin (PRAVACHOL) 40 MG tablet TAKE 1 TABLET BY MOUTH EVERY DAY 90 tablet 1  . triamcinolone cream (KENALOG) 0.1 % Apply 1 application topically 2 (two) times daily. (Patient not taking: Reported on 10/16/2020) 30 g 0   No current facility-administered medications on file prior to visit.   No Known Allergies Social History   Socioeconomic History  . Marital status: Married    Spouse name: Not on file  . Number of children: Not on file  . Years of education: Not on file  . Highest education level: Not on file   Occupational History  . Not on file  Tobacco Use  . Smoking status: Never Smoker  . Smokeless tobacco: Never Used  Substance and Sexual Activity  . Alcohol use: No  . Drug use: No  . Sexual activity: Not Currently  Other Topics Concern  . Not on file  Social History Narrative   ** Merged History Encounter **       Social Determinants of Health   Financial Resource Strain: Not on file  Food Insecurity: Not on file  Transportation Needs: Not on file  Physical Activity: Not on file  Stress: Not on file  Social Connections: Not on file  Intimate Partner Violence: Not on file      Review of Systems  All other systems reviewed and are negative.      Objective:   Physical Exam Vitals reviewed.  Constitutional:      General: She is not in acute distress.    Appearance: She is well-developed. She is not diaphoretic.  Eyes:     Conjunctiva/sclera: Conjunctivae normal.     Pupils: Pupils are equal, round, and reactive to light.  Neck:     Thyroid: No thyromegaly.     Vascular: No JVD.  Cardiovascular:     Rate and Rhythm: Normal rate and regular rhythm.     Heart sounds: Normal heart sounds. No murmur heard. No friction rub. No gallop.   Pulmonary:     Effort: Pulmonary effort is normal. No respiratory distress.     Breath sounds: Normal breath sounds. No stridor. No wheezing or rales.  Musculoskeletal:     Lumbar back: Bony tenderness present. No spasms or tenderness. Decreased range of motion. Negative right straight leg raise test and negative left straight leg raise test.       Legs:  Lymphadenopathy:     Cervical: No cervical adenopathy.           Assessment & Plan:  Iron deficiency anemia, unspecified iron deficiency anemia type - Plan: CBC with Differential/Platelet, Iron  Right sided sciatica  Patient symptoms are consistent with right-sided sciatica.  The question is whether she is having sciatica due to degenerative disc disease in the lumbar  spine given her low back pain or whether she is having some type of piriformis syndrome or perhaps compression of the sciatic nerve by the ischial tuberosity when she sitting.  I suspect degenerative disc disease given her low back pain so I will start her on a prednisone taper pack.  If the symptoms do not improve I will proceed to order x-rays of the lumbar spine.  We could also consider trying gabapentin for neuropathic pain.

## 2021-02-28 LAB — CBC WITH DIFFERENTIAL/PLATELET
Absolute Monocytes: 557 cells/uL (ref 200–950)
Basophils Absolute: 29 cells/uL (ref 0–200)
Basophils Relative: 0.5 %
Eosinophils Absolute: 168 cells/uL (ref 15–500)
Eosinophils Relative: 2.9 %
HCT: 38.5 % (ref 35.0–45.0)
Hemoglobin: 12 g/dL (ref 11.7–15.5)
Lymphs Abs: 1798 cells/uL (ref 850–3900)
MCH: 26.1 pg — ABNORMAL LOW (ref 27.0–33.0)
MCHC: 31.2 g/dL — ABNORMAL LOW (ref 32.0–36.0)
MCV: 83.9 fL (ref 80.0–100.0)
MPV: 11.3 fL (ref 7.5–12.5)
Monocytes Relative: 9.6 %
Neutro Abs: 3248 cells/uL (ref 1500–7800)
Neutrophils Relative %: 56 %
Platelets: 213 10*3/uL (ref 140–400)
RBC: 4.59 10*6/uL (ref 3.80–5.10)
RDW: 13.1 % (ref 11.0–15.0)
Total Lymphocyte: 31 %
WBC: 5.8 10*3/uL (ref 3.8–10.8)

## 2021-02-28 LAB — IRON: Iron: 54 ug/dL (ref 45–160)

## 2021-03-05 ENCOUNTER — Encounter: Payer: Self-pay | Admitting: *Deleted

## 2021-05-08 ENCOUNTER — Ambulatory Visit (INDEPENDENT_AMBULATORY_CARE_PROVIDER_SITE_OTHER): Payer: Medicare Other | Admitting: Family Medicine

## 2021-05-08 ENCOUNTER — Other Ambulatory Visit: Payer: Self-pay

## 2021-05-08 ENCOUNTER — Encounter: Payer: Self-pay | Admitting: Family Medicine

## 2021-05-08 VITALS — BP 128/78 | HR 72 | Temp 98.3°F | Resp 16 | Ht 63.0 in | Wt 164.0 lb

## 2021-05-08 DIAGNOSIS — M5431 Sciatica, right side: Secondary | ICD-10-CM | POA: Diagnosis not present

## 2021-05-08 NOTE — Progress Notes (Signed)
Subjective:    Patient ID: Sherri Leon, female    DOB: [birth year], 85 y.o.   MRN: [id number] 02/27/21 Patient is a very sweet 85 year old Caucasian female who presents with pain in her right gluteus for approximately 1 month.  She states that whenever she sits, she has a burning pain that feels like she sitting on a hot coal right around the ischial tuberosity.  Is a neuropathic pain for certain.  However she is also having low back pain.  She states that whenever she stands for prolonged period of time her lower back will start to hurt and she will feel pain down her right posterior hip as well.  The pain does not extend further than her posterior right thigh.  She denies any numbness or tingling radiating down her leg into her foot.  She denies any leg weakness.  She denies any recent falls or injuries.  She has bilateral negative straight leg raises today.  Reflexes are diminished bilaterally as I am unable to distract the patient on her exam and she is tensing up her muscles.  05/08/21 Patient states that the pain has not improved at all.  She continues to have pain in her right gluteus however it now radiates down her right leg to her right calf.  Also the pain now extends up into her lower back around the level of L5.  She states that she is having pain in her lower back whenever she bends over.  She states that she had to stop working because prolonged standing exacerbated the pain.  However if she sits on hard surfaces such as a wooden chair it also exacerbates the pain.  She denies any leg weakness.  She denies any bowel or bladder incontinence.  She denies any saddle anesthesias.  The pain is a sharp burning searing pain that goes down her posterior and lateral right leg and has now been present for more than 2-1/2 months  Past Medical History:  Diagnosis Date   Diabetes mellitus without complication ([place])    Hyperlipidemia    Hypertension    Iron deficiency anemia    Past Surgical  History:  Procedure Laterality Date   [place] N/A 08/23/2015   Procedure: Left Heart Cath;  Surgeon: Leonie Man, MD;  Location: [place] [facility];  Service: Cardiovascular;  Laterality: N/A;   COLONOSCOPY WITH PROPOFOL N/A 06/13/2017   Procedure: COLONOSCOPY WITH PROPOFOL;  Surgeon: Manya Silvas, MD;  Location: [facility] ENDOSCOPY;  Service: Endoscopy;  Laterality: N/A;   ESOPHAGOGASTRODUODENOSCOPY (EGD) WITH PROPOFOL N/A 06/13/2017   Procedure: ESOPHAGOGASTRODUODENOSCOPY (EGD) WITH PROPOFOL;  Surgeon: Manya Silvas, MD;  Location: [facility] ENDOSCOPY;  Service: Endoscopy;  Laterality: N/A;   HERNIA REPAIR     TONSILLECTOMY  1955   Current Outpatient Medications on File Prior to Visit  Medication Sig Dispense Refill   aspirin EC 81 MG tablet Take 81 mg by mouth every other day.      clobetasol cream (TEMOVATE) 0.05 %      glucose blood (ONETOUCH VERIO) test strip CHECK BLOOD SUGAR EVERY DAY 100 strip 3   hydrochlorothiazide (HYDRODIURIL) 25 MG tablet Take 1 tablet (25 mg total) by mouth daily. 90 tablet 1   losartan (COZAAR) 100 MG tablet TAKE 1 TABLET(100 MG) BY MOUTH DAILY 90 tablet 3   metFORMIN (GLUCOPHAGE) 500 MG tablet TAKE 1 TABLET(500 MG) BY MOUTH  TWICE DAILY WITH A MEAL 180 tablet 3   Multiple Vitamins-Minerals (MULTIVITAMIN ADULT PO) Take by mouth.     oxybutynin (DITROPAN) 5 MG tablet Take 1 tablet (5 mg total) by mouth every 8 (eight) hours as needed for bladder spasms. 30 tablet 0   pantoprazole (PROTONIX) 40 MG tablet TAKE 1 TABLET(40 MG) BY MOUTH TWICE DAILY 60 tablet 10   pravastatin (PRAVACHOL) 40 MG tablet TAKE 1 TABLET BY MOUTH EVERY DAY 90 tablet 1   triamcinolone cream (KENALOG) 0.1 % Apply 1 application topically 2 (two) times daily. 30 g 0   No current facility-administered medications on file prior to visit.   No Known Allergies Social History   Socioeconomic  History   Marital status: Married    Spouse name: Not on file   Number of children: Not on file   Years of education: Not on file   Highest education level: Not on file  Occupational History   Not on file  Tobacco Use   Smoking status: Never   Smokeless tobacco: Never  Substance and Sexual Activity   Alcohol use: No   Drug use: No   Sexual activity: Not Currently  Other Topics Concern   Not on file  Social History Narrative   ** Merged History Encounter **       Social Determinants of Health   Financial Resource Strain: Not on file  Food Insecurity: Not on file  Transportation Needs: Not on file  Physical Activity: Not on file  Stress: Not on file  Social Connections: Not on file  Intimate Partner Violence: Not on file      Review of Systems  All other systems reviewed and are negative.     Objective:   Physical Exam Vitals reviewed.  Constitutional:      General: She is not in acute distress.    Appearance: She is well-developed. She is not diaphoretic.  Eyes:     Conjunctiva/sclera: Conjunctivae normal.     Pupils: Pupils are equal, round, and reactive to light.  Neck:     Thyroid: No thyromegaly.     Vascular: No JVD.  Cardiovascular:     Rate and Rhythm: Normal rate and regular rhythm.     Heart sounds: Normal heart sounds. No murmur heard.   No friction rub. No gallop.  Pulmonary:     Effort: Pulmonary effort is normal. No respiratory distress.     Breath sounds: Normal breath sounds. No stridor. No wheezing or rales.  Musculoskeletal:     Lumbar back: Bony tenderness present. No spasms or tenderness. Decreased range of motion. Negative right straight leg raise test and negative left straight leg raise test.       Legs:  Lymphadenopathy:     Cervical: No cervical adenopathy.          Assessment & Plan:  Right sided sciatica - Plan: MR Lumbar Spine Wo Contrast Begin by obtaining an MRI of the lumbar spine.  I suspect a herniated disc causing  right-sided sciatica.  Patient would like to proceed with cortisone injections if MRI suggest that this would be beneficial.  Await the results of the MRI.

## 2021-05-15 ENCOUNTER — Other Ambulatory Visit: Payer: Self-pay | Admitting: Family Medicine

## 2021-05-23 ENCOUNTER — Ambulatory Visit (HOSPITAL_COMMUNITY)
Admission: RE | Admit: 2021-05-23 | Discharge: 2021-05-23 | Disposition: A | Discharge status: Home / Self Care | Payer: Medicare Other | Source: Ambulatory Visit | Attending: Family Medicine | Admitting: Family Medicine

## 2021-05-23 ENCOUNTER — Other Ambulatory Visit: Payer: Self-pay

## 2021-05-23 DIAGNOSIS — M5431 Sciatica, right side: Secondary | ICD-10-CM | POA: Diagnosis not present

## 2021-05-23 DIAGNOSIS — M545 Low back pain, unspecified: Secondary | ICD-10-CM | POA: Diagnosis not present

## 2021-05-25 ENCOUNTER — Telehealth: Payer: Self-pay | Admitting: Family Medicine

## 2021-05-25 NOTE — Telephone Encounter (Signed)
Please see imaging results for further information.  

## 2021-05-25 NOTE — Telephone Encounter (Signed)
Pt called in wanting to discuss her results from her MRI, pt stated someone called yesterday but she wasn't able to get back in touch with that person. Please call  Cb#: [phone]

## 2021-05-29 ENCOUNTER — Telehealth: Payer: Self-pay | Admitting: Family Medicine

## 2021-05-29 ENCOUNTER — Other Ambulatory Visit: Payer: Self-pay

## 2021-05-29 NOTE — Telephone Encounter (Signed)
New meter request sent to pt pharmacy asking to dispense insurance approved device

## 2021-05-29 NOTE — Telephone Encounter (Signed)
Patient went to pharmacy to get strips for glucose meter she currently uses. Pharmacy informed patient her insurance will not cover glucose meter she has; new script needed by pharmacy. Please advise at [phone]

## 2021-06-11 ENCOUNTER — Other Ambulatory Visit: Payer: Self-pay

## 2021-06-11 DIAGNOSIS — E118 Type 2 diabetes mellitus with unspecified complications: Secondary | ICD-10-CM

## 2021-06-11 MED ORDER — ACCU-CHEK AVIVA PLUS W/DEVICE KIT: PACK | 0 refills | Status: DC

## 2021-06-12 ENCOUNTER — Other Ambulatory Visit: Payer: Self-pay | Admitting: *Deleted

## 2021-06-12 MED ORDER — ACCU-CHEK AVIVA PLUS VI STRP: ORAL_STRIP | 12 refills | Status: DC

## 2021-07-04 ENCOUNTER — Telehealth: Payer: Self-pay | Admitting: *Deleted

## 2021-07-04 NOTE — Telephone Encounter (Signed)
Received call from patient.   Reports that she is having dry cough, fatigue, malaise, weakness, nausea. States that Sx started x2 days prior.   Advised to obtain COVID test and contact office with results.   Advised to continue symptom management with OTC medications:  Tylenol/ Ibuprofen for fever/ body aches,  Mucinex/ Delsym for cough/ chest congestion,  Afrin/Sudafed/nasal saline for sinus pressure/ nasal congestion.   If chest pain, shortness of breath, fever >104 that is unresponsive to antipyretics noted, or if unable to tolerate fluids, advised to go to ER for evaluation.

## 2021-07-10 ENCOUNTER — Ambulatory Visit (INDEPENDENT_AMBULATORY_CARE_PROVIDER_SITE_OTHER): Payer: Medicare Other | Admitting: Family Medicine

## 2021-07-10 ENCOUNTER — Other Ambulatory Visit: Payer: Self-pay

## 2021-07-10 VITALS — BP 124/68 | HR 86 | Temp 98.0°F | Wt 155.0 lb

## 2021-07-10 DIAGNOSIS — J189 Pneumonia, unspecified organism: Secondary | ICD-10-CM | POA: Diagnosis not present

## 2021-07-10 MED ORDER — AMOXICILLIN-POT CLAVULANATE 875-125 MG PO TABS: 1.0000 | ORAL_TABLET | Freq: Two times a day (BID) | ORAL | 0 refills | Status: DC

## 2021-07-10 MED ORDER — NIRMATRELVIR/RITONAVIR (PAXLOVID)TABLET: 3.0000 | ORAL_TABLET | Freq: Two times a day (BID) | ORAL | 0 refills | Status: AC

## 2021-07-10 NOTE — Progress Notes (Signed)
Subjective:    Patient ID: Sherri Leon, female    DOB: 1936-07-19, 85 y.o.   MRN: [id number]  Patient states that she started feeling tired Tuesday of last week.  By Thursday she developed cough and right sided pleurisy.  She denies any fevers or hemoptysis or purulent sputum however she reports pain in her right ribs whenever she coughs or takes a deep breath.  Pulse oximetry is 93% on room air.  There is no respiratory distress.  She denies any rhinorrhea or sore throat.  However she does report that she took a COVID test Thursday of last week that turned out positive.  She discounted this stating that she had not been around anyone so she thought the test was a false positive.  However she also admits that she went to homecoming at her church Sunday prior to symptoms beginning and was around a large group of people Past Medical History:  Diagnosis Date   Diabetes mellitus without complication ([place])    Hyperlipidemia    Hypertension    Iron deficiency anemia    Past Surgical History:  Procedure Laterality Date   [place] N/A 08/23/2015   Procedure: Left Heart Cath;  Surgeon: Leonie Man, MD;  Location: [place] [facility];  Service: Cardiovascular;  Laterality: N/A;   COLONOSCOPY WITH PROPOFOL N/A 06/13/2017   Procedure: COLONOSCOPY WITH PROPOFOL;  Surgeon: Manya Silvas, MD;  Location[facility] END[facility]COPY;  Service: Endoscopy;  Laterality: N/A;   ESOPHAGOGASTRODUODENOSCOPY (EGD) WITH PROPOFOL N/A 06/13/2017   Procedure: ESOPHAGOGASTRODUODENOSCOPY (EGD) WITH PROPOFOL;  Surgeon: Manya Silvas, MD;  Location: [facility] ENDOSCOPY;  Service: Endoscopy;  Laterality: N/A;   HERNIA REPAIR     TONSILLECTOMY  1955   Current Outpatient Medications on File Prior to Visit  Medication Sig Dispense Refill   aspirin EC 81 MG tablet Take 81 mg by mouth every other day.      Blood Glucose Monitoring Suppl  (ACCU-CHEK AVIVA PLUS) w/Device KIT PLEASE DISPENSE PER PATIENT'S INSURANCE 1 kit 0   clobetasol cream (TEMOVATE) 0.05 %      glucose blood (ACCU-CHEK AVIVA PLUS) test strip Use as instructed to monitor FSBS 1x daily. Dx: E11.9. 100 each 12   hydrochlorothiazide (HYDRODIURIL) 25 MG tablet Take 1 tablet (25 mg total) by mouth daily. 90 tablet 1   losartan (COZAAR) 100 MG tablet TAKE 1 TABLET(100 MG) BY MOUTH DAILY 90 tablet 3   metFORMIN (GLUCOPHAGE) 500 MG tablet TAKE 1 TABLET(500 MG) BY MOUTH TWICE DAILY WITH A MEAL 180 tablet 3   Multiple Vitamins-Minerals (MULTIVITAMIN ADULT PO) Take by mouth.     oxybutynin (DITROPAN) 5 MG tablet Take 1 tablet (5 mg total) by mouth every 8 (eight) hours as needed for bladder spasms. 30 tablet 0   pantoprazole (PROTONIX) 40 MG tablet TAKE 1 TABLET(40 MG) BY MOUTH TWICE DAILY 60 tablet 10   pravastatin (PRAVACHOL) 40 MG tablet TAKE 1 TABLET BY MOUTH EVERY DAY 90 tablet 1   triamcinolone cream (KENALOG) 0.1 % Apply 1 application topically 2 (two) times daily. 30 g 0   No current facility-administered medications on file prior to visit.   No Known Allergies Social History   Socioeconomic History   Marital status: Married    Spouse name: Not on file   Number of children: Not on file   Years of education: Not on file  Highest education level: Not on file  Occupational History   Not on file  Tobacco Use   Smoking status: Never   Smokeless tobacco: Never  Substance and Sexual Activity   Alcohol use: No   Drug use: No   Sexual activity: Not Currently  Other Topics Concern   Not on file  Social History Narrative   ** Merged History Encounter **       Social Determinants of Health   Financial Resource Strain: Not on file  Food Insecurity: Not on file  Transportation Needs: Not on file  Physical Activity: Not on file  Stress: Not on file  Social Connections: Not on file  Intimate Partner Violence: Not on file      Review of Systems  All  other systems reviewed and are negative.     Objective:   Physical Exam Vitals reviewed.  Constitutional:      General: She is not in acute distress.    Appearance: She is well-developed. She is not diaphoretic.  Eyes:     Conjunctiva/sclera: Conjunctivae normal.     Pupils: Pupils are equal, round, and reactive to light.  Neck:     Thyroid: No thyromegaly.     Vascular: No JVD.  Cardiovascular:     Rate and Rhythm: Normal rate and regular rhythm.     Heart sounds: Normal heart sounds. No murmur heard.   No friction rub. No gallop.  Pulmonary:     Effort: Pulmonary effort is normal. No respiratory distress.     Breath sounds: No stridor or decreased air movement. Examination of the right-lower field reveals rales. Rales present. No decreased breath sounds, wheezing or rhonchi.  Lymphadenopathy:     Cervical: No cervical adenopathy.          Assessment & Plan:  Pneumonia of right lower lobe due to infectious organism - Plan: SARS-COV-2 RNA,(COVID-19) QUAL NAAT Patient has impressive Rales in the right lower lung.  I believe the patient has pneumonia.  The question is whether this is COVID-pneumonia versus community-acquired bacterial pneumonia.  I will repeat a COVID test today.  The patient is outside the 5-day window for PACs [place] however given her advanced age and her comorbidities I am going to try anyway with PACs low event and begin 3 tablets twice daily for 5 days.  I will also treat her for possible bacterial pneumonia with Augmentin 875 mg twice daily.  Go to the hospital immediately if shortness of breath develops or if her situation worsens.  This was explained in detail to the patient

## 2021-07-11 LAB — SARS-COV-2 RNA,(COVID-19) QUALITATIVE NAAT: SARS CoV2 RNA: DETECTED — AB

## 2021-07-12 ENCOUNTER — Telehealth: Payer: Self-pay

## 2021-07-12 NOTE — Telephone Encounter (Signed)
Please see labs for more information.

## 2021-07-12 NOTE — Telephone Encounter (Signed)
Pt called in wanting to discuss the results of her COVID test. Pt wanted to know if she should continue to take the meds that she was prescribed at her last visit. Please advise.  Cb#: [phone]

## 2021-07-30 ENCOUNTER — Telehealth: Payer: Self-pay | Admitting: *Deleted

## 2021-07-30 NOTE — Telephone Encounter (Signed)
Received call from patient.   Reports that she had intermittent substernal chest pain, weakness to B arms, and SOB noted while she was at church on 07/29/2021.  States that severe chest pain has resolved, but she continues to have discomfort in her chest and arms. States that she feels like she can't take a deep breath.   Advised to go to ER for evaluation.   Verbalized understanding.

## 2021-08-02 ENCOUNTER — Telehealth: Payer: Self-pay | Admitting: Family Medicine

## 2021-08-02 NOTE — Telephone Encounter (Signed)
Left message for patient to call back and schedule Medicare Annual Wellness Visit (AWV) in office.  ° °If not able to come in office, please offer to do virtually or by telephone.  Left office number and my jabber #[phone]. ° °Due for AWVI ° °Please schedule at anytime with Nurse Health Advisor. °  °

## 2021-08-15 ENCOUNTER — Other Ambulatory Visit: Payer: Self-pay | Admitting: Family Medicine

## 2021-08-17 ENCOUNTER — Other Ambulatory Visit: Payer: Self-pay

## 2021-08-17 ENCOUNTER — Ambulatory Visit (INDEPENDENT_AMBULATORY_CARE_PROVIDER_SITE_OTHER): Payer: Medicare Other

## 2021-08-17 ENCOUNTER — Telehealth: Payer: Self-pay

## 2021-08-17 VITALS — BP 118/62 | HR 73 | Temp 98.0°F | Ht 63.0 in | Wt 155.0 lb

## 2021-08-17 DIAGNOSIS — Z0000 Encounter for general adult medical examination without abnormal findings: Secondary | ICD-10-CM | POA: Diagnosis not present

## 2021-08-17 NOTE — Telephone Encounter (Signed)
Pt here for AWV. C/O increased issues of constipation and rectal bleeding. Pt asks if she could be referred by to Dr. Vira Agar for another colonoscopy?

## 2021-08-17 NOTE — Progress Notes (Signed)
Subjective:   Sherri Leon is a 85 y.o. female who presents for an Initial Medicare Annual Wellness Visit.  Review of Systems           Objective:    Today's Vitals   08/17/21 1524  BP: 118/62  Pulse: 73  Temp: 98 F (36.7 C)  SpO2: 99%  Weight: 155 lb (70.3 kg)  Height: _0  (1.6 m)   Body mass index is 27.46 kg/m.  Advanced Directives 08/17/2021 08/23/2015 04/22/2015  Does Patient Have a Medical Advance Directive? No No No  Would patient like information on creating a medical advance directive? No - Patient declined No - patient declined information -    Current Medications (verified) Outpatient Encounter Medications as of 08/17/2021  Medication Sig   aspirin EC 81 MG tablet Take 81 mg by mouth every other day.    Blood Glucose Monitoring Suppl (ACCU-CHEK AVIVA PLUS) w/Device KIT PLEASE DISPENSE PER PATIENT'S INSURANCE   clobetasol cream (TEMOVATE) 0.05 %    glucose blood (ACCU-CHEK AVIVA PLUS) test strip Use as instructed to monitor FSBS 1x daily. Dx: E11.9.   hydrochlorothiazide (HYDRODIURIL) 25 MG tablet Take 1 tablet (25 mg total) by mouth daily.   losartan (COZAAR) 100 MG tablet TAKE 1 TABLET(100 MG) BY MOUTH DAILY   metFORMIN (GLUCOPHAGE) 500 MG tablet TAKE 1 TABLET(500 MG) BY MOUTH TWICE DAILY WITH A MEAL   Multiple Vitamins-Minerals (MULTIVITAMIN ADULT PO) Take by mouth.   oxybutynin (DITROPAN) 5 MG tablet Take 1 tablet (5 mg total) by mouth every 8 (eight) hours as needed for bladder spasms.   pantoprazole (PROTONIX) 40 MG tablet TAKE 1 TABLET(40 MG) BY MOUTH TWICE DAILY   pravastatin (PRAVACHOL) 40 MG tablet TAKE 1 TABLET BY MOUTH EVERY DAY   triamcinolone cream (KENALOG) 0.1 % Apply 1 application topically 2 (two) times daily.   amoxicillin-clavulanate (AUGMENTIN) 875-125 MG tablet Take 1 tablet by mouth 2 (two) times daily. (Patient not taking: Reported on 08/17/2021)   No facility-administered encounter medications on file as of 08/17/2021.    Allergies  (verified) Patient has no known allergies.   History: Past Medical History:  Diagnosis Date   Diabetes mellitus without complication ([place])    Hyperlipidemia    Hypertension    Iron deficiency anemia    Past Surgical History:  Procedure Laterality Date   [place] N/A 08/23/2015   Procedure: Left Heart Cath;  Surgeon: Leonie Man, MD;  Location: [place] [facility];  Service: Cardiovascular;  Laterality: N/A;   COLONOSCOPY WITH PROPOFOL N/A 06/13/2017   Procedure: COLONOSCOPY WITH PROPOFOL;  Surgeon: Manya Silvas, MD;  Location: [facility] ENDOSCOPY;  Service: Endoscopy;  Laterality: N/A;   ESOPHAGOGASTRODUODENOSCOPY (EGD) WITH PROPOFOL N/A 06/13/2017   Procedure: ESOPHAGOGASTRODUODENOSCOPY (EGD) WITH PROPOFOL;  Surgeon: Manya Silvas, MD;  Location: [facility] ENDOSCOPY;  Service: Endoscopy;  Laterality: N/A;   HERNIA REPAIR     TONSILLECTOMY  1955   Family History  Problem Relation Age of Onset   Hypertension Mother    Social History   Socioeconomic History   Marital status: Widowed    Spouse name: Not on file   Number of children: 1   Years of education: Not on file   Highest education level: Not on file  Occupational History   Not on file  Tobacco Use   Smoking status: Never   Smokeless tobacco: Never  Substance and Sexual Activity   Alcohol use: No   Drug use: No   Sexual activity: Not Currently  Other Topics Concern   Not on file  Social History Narrative   ** Merged History Encounter **       Social Determinants of Health   Financial Resource Strain: Low Risk    Difficulty of Paying Living Expenses: Not hard at all  Food Insecurity: No Food Insecurity   Worried About Charity fundraiser in the Last Year: Never true   [place] in the Last Year: Never true  Transportation Needs: No Transportation Needs   Lack of Transportation (Medical): No   Lack of  Transportation (Non-Medical): No  Physical Activity: Sufficiently Active   Days of Exercise per Week: 5 days   Minutes of Exercise per Session: 60 min  Stress: No Stress Concern Present   Feeling of Stress : Not at all  Social Connections: Moderately Integrated   Frequency of Communication with Friends and Family: More than three times a week   Frequency of Social Gatherings with Friends and Family: More than three times a week   Attends Religious Services: More than 4 times per year   Active Member of [employer] or Organizations: Yes   Attends Archivist Meetings: More than 4 times per year   Marital Status: Widowed    Tobacco Counseling Counseling given: Not Answered   Clinical Intake:  Pre-visit preparation completed: Yes  Pain : No/denies pain     Nutritional Risks: None Diabetes: Yes  How often do you need to have someone help you when you read instructions, pamphlets, or other written materials from your doctor or pharmacy?: 1 - Never  Diabetic?Nutrition Risk Assessment:  Has the patient had any N/V/D within the last 2 months?  No  Does the patient have any non-healing wounds?  No  Has the patient had any unintentional weight loss or weight gain?  No   Diabetes:  Is the patient diabetic?  Yes  If diabetic, was a CBG obtained today?  No  Did the patient bring in their glucometer from home?  No  How often do you monitor your CBG's? PRN.   Financial Strains and Diabetes Management:  Are you having any financial strains with the device, your supplies or your medication? No .  Does the patient want to be seen by Chronic Care Management for management of their diabetes?  No  Would the patient like to be referred to a Nutritionist or for Diabetic Management?  No   Diabetic Exams:  Diabetic Eye Exam: Completed 11/27/2020. Overdue for diabetic eye exam. Pt has been advised about the importance in completing this exam. A referral has been placed today. Message  sent to referral coordinator for scheduling purposes. Advised pt to expect a call from office referred to regarding appt.  Diabetic Foot Exam: Completed 03/23/2020. Pt has been advised about the importance in completing this exam.     Interpreter Needed?: No  Information entered by :: MJ Davanta Meuser, LPN   Activities of Daily Living In your present state of health, do you have any difficulty performing the following activities: 08/17/2021  Hearing? N  Vision? N  Difficulty concentrating or making decisions? N  Walking or climbing stairs? N  Dressing or bathing? N  Doing errands, shopping? N  Preparing Food and eating ? N  Using the Toilet? N  In the past six months, have you accidently leaked urine? Y  Comment occasional  Do you have problems with loss of bowel control? N  Managing your Medications? N  Managing your Finances? N  Housekeeping or managing your Housekeeping? N  Some recent data might be hidden    Patient Care Team: Susy Frizzle, MD as PCP - General (Family Medicine)  Indicate any recent Medical Services you may have received from other than [facility] providers in the past year (date may be approximate).     Assessment:   This is a routine wellness examination for Kariyah.  Hearing/Vision screen Hearing Screening - Comments:: No hearing issues. Vision Screening - Comments:: Readers. Dr. Gloriann Loan. Due 11/27/2021  Dietary issues and exercise activities discussed: Current Exercise Habits: Home exercise routine, Type of exercise: walking, Time (Minutes): 60, Frequency (Times/Week): 5, Weekly Exercise (Minutes/Week): 300, Intensity: Mild, Exercise limited by: cardiac condition(s)   Goals Addressed             This Visit's Progress    DIET - INCREASE WATER INTAKE       Make sure to drink 3 bottles of water per day.       Depression Screen PHQ 2/9 Scores 08/17/2021 06/11/2018 12/09/2017 08/08/2017 06/27/2016 03/04/2016 10/04/2015  PHQ - 2 Score 0 0 0 0 0 0 0    Fall  Risk Fall Risk  08/17/2021 06/11/2018 12/09/2017 08/08/2017 06/27/2016  Falls in the past year? 0 No No No Yes  Number falls in past yr: 0 - - - 1  Injury with Fall? 0 - - - No  Risk for fall due to : No Fall Risks - - - -  Follow up Falls prevention discussed - - - -    FALL RISK PREVENTION PERTAINING TO THE HOME:  Any stairs in or around the home? Yes  If so, are there any without handrails? No  Home free of loose throw rugs in walkways, pet beds, electrical cords, etc? Yes  Adequate lighting in your home to reduce risk of falls? Yes   ASSISTIVE DEVICES UTILIZED TO PREVENT FALLS:  Life alert? No  Use of a cane, walker or w/c? Yes  Grab bars in the bathroom? Yes  Shower chair or bench in shower? Yes  Elevated toilet seat or a handicapped toilet? Yes   TIMED UP AND GO:  Was the test performed? Yes .  Length of time to ambulate 10 feet: 7 sec.   Gait steady and fast without use of assistive device  Cognitive Function:     6CIT Screen 08/17/2021  What Year? 0 points  What month? 0 points  What time? 0 points  Count back from 20 0 points  Months in reverse 2 points  Repeat phrase 0 points  Total Score 2    Immunizations Immunization History  Administered Date(s) Administered   Fluad Quad(high Dose 65+) 07/20/2019, 09/07/2020   Influenza Split 08/17/2014   Influenza, High Dose Seasonal PF 08/16/2014, 10/04/2015, 08/08/2017, 08/26/2018   Influenza,inj,Quad PF,6+ Mos 11/01/2016   Influenza-Unspecified 08/18/2014, 08/17/2021   Pneumococcal Conjugate-13 01/15/2012, 05/25/2014   Pneumococcal Polysaccharide-23 11/25/2016    TDAP status: Up to date  Flu Vaccine status: Up to date  Pneumococcal vaccine status: Up to date  Covid-19 vaccine status: Declined, Education has been provided regarding the importance of this vaccine but patient still declined. Advised may receive this vaccine at local pharmacy or Health Dept.or vaccine clinic. Aware to provide a copy of the  vaccination record if obtained from local pharmacy or Health Dept. Verbalized acceptance and understanding.  Qualifies for Shingles  Vaccine? Yes   Zostavax completed No   Shingrix Completed?: No.    Education has been provided regarding the importance of this vaccine. Patient has been advised to call insurance company to determine out of pocket expense if they have not yet received this vaccine. Advised may also receive vaccine at local pharmacy or Health Dept. Verbalized acceptance and understanding.  Screening Tests Health Maintenance  Topic Date Due   COVID-19 Vaccine (1) Never done   Zoster Vaccines- Shingrix (1 of 2) Never done   FOOT EXAM  03/23/2021   HEMOGLOBIN A1C  03/26/2021   OPHTHALMOLOGY EXAM  11/27/2021   TETANUS/TDAP  07/09/2025   INFLUENZA VACCINE  Completed   DEXA SCAN  Completed   HPV VACCINES  Aged Out    Health Maintenance  Health Maintenance Due  Topic Date Due   COVID-19 Vaccine (1) Never done   Zoster Vaccines- Shingrix (1 of 2) Never done   FOOT EXAM  03/23/2021   HEMOGLOBIN A1C  03/26/2021    Colorectal cancer screening: Type of screening: Colonoscopy. Completed 06/13/2017. Repeat every 5 years  Mammogram status: Completed 12/23/2018. Repeat every year  Bone Density status: Completed 07/10/2015. Results reflect: Bone density results: NORMAL. Repeat every 2 years.  Lung Cancer Screening: (Low Dose CT Chest recommended if Age 53-80 years, 30 pack-year currently smoking OR have quit w/in 15years.) does not qualify.     Additional Screening:  Hepatitis C Screening: does not qualify;   Vision Screening: Recommended annual ophthalmology exams for early detection of glaucoma and other disorders of the eye. Is the patient up to date with their annual eye exam?  Yes  Who is the provider or what is the name of the office in which the patient attends annual eye exams? Dr. Gloriann Loan If pt is not established with a provider, would they like to be referred to a  provider to establish care? No .   Dental Screening: Recommended annual dental exams for proper oral hygiene  Community Resource Referral / Chronic Care Management: CRR required this visit?  No   CCM required this visit?  No      Plan:     I have personally reviewed and noted the following in the patient's chart:   Medical and social history Use of alcohol, tobacco or illicit drugs  Current medications and supplements including opioid prescriptions. Patient is not currently taking opioid prescriptions. Functional ability and status Nutritional status Physical activity Advanced directives List of other physicians Hospitalizations, surgeries, and ER visits in previous 12 months Vitals Screenings to include cognitive, depression, and falls Referrals and appointments  In addition, I have reviewed and discussed with patient certain preventive protocols, quality metrics, and best practice recommendations. A written personalized care plan for preventive services as well as general preventive health recommendations were provided to patient.     Chriss Driver, LPN   04/23/44   Nurse Notes: Pt c/o issues with constipation and rectal bleeding for the last several months. Would like referral back to Dr. Vira Agar for another colonoscopy. Declines Dexa. Will call to schedule Mammogram.

## 2021-08-17 NOTE — Patient Instructions (Signed)
Sherri Leon , Thank you for taking time to come for your Medicare Wellness Visit. I appreciate your ongoing commitment to your health goals. Please review the following plan we discussed and let me know if I can assist you in the future.   Screening recommendations/referrals: Colonoscopy: Done 06/13/2017 Mammogram: Done 12/23/2018 Bone Density: Done 07/10/2015 Recommended yearly ophthalmology/optometry visit for glaucoma screening and checkup Recommended yearly dental visit for hygiene and checkup  Vaccinations: Influenza vaccine: 08/17/2021 Pneumococcal vaccine: 05/25/14, 11/25/2016 Tdap vaccine: 11/18/2014 Repeat in 10 years  Shingles vaccine: Shingrix discussed. Please contact your pharmacy for coverage information.     Covid-19:Declined  Advanced directives: Advance directive discussed with you today. Even though you declined this today, please call our office should you change your mind, and we can give you the proper paperwork for you to fill out.   Conditions/risks identified: Keep up the good work!!!  Next appointment: Follow up in one year for your annual wellness visit 2023   Preventive Care 65 Years and Older, Female Preventive care refers to lifestyle choices and visits with your health care provider that can promote health and wellness. What does preventive care include? A yearly physical exam. This is also called an annual well check. Dental exams once or twice a year. Routine eye exams. Ask your health care provider how often you should have your eyes checked. Personal lifestyle choices, including: Daily care of your teeth and gums. Regular physical activity. Eating a healthy diet. Avoiding tobacco and drug use. Limiting alcohol use. Practicing safe sex. Taking low-dose aspirin every day. Taking vitamin and mineral supplements as recommended by your health care provider. What happens during an annual well check? The services and screenings done by your health care  provider during your annual well check will depend on your age, overall health, lifestyle risk factors, and family history of disease. Counseling  Your health care provider may ask you questions about your: Alcohol use. Tobacco use. Drug use. Emotional well-being. Home and relationship well-being. Sexual activity. Eating habits. History of falls. Memory and ability to understand (cognition). Work and work Statistician. Reproductive health. Screening  You may have the following tests or measurements: Height, weight, and BMI. Blood pressure. Lipid and cholesterol levels. These may be checked every 5 years, or more frequently if you are over 39 years old. Skin check. Lung cancer screening. You may have this screening every year starting at age 20 if you have a 30-pack-year history of smoking and currently smoke or have quit within the past 15 years. Fecal occult blood test (FOBT) of the stool. You may have this test every year starting at age 86. Flexible sigmoidoscopy or colonoscopy. You may have a sigmoidoscopy every 5 years or a colonoscopy every 10 years starting at age 60. Hepatitis C blood test. Hepatitis B blood test. Sexually transmitted disease (STD) testing. Diabetes screening. This is done by checking your blood sugar (glucose) after you have not eaten for a while (fasting). You may have this done every 1-3 years. Bone density scan. This is done to screen for osteoporosis. You may have this done starting at age 10. Mammogram. This may be done every 1-2 years. Talk to your health care provider about how often you should have regular mammograms. Talk with your health care provider about your test results, treatment options, and if necessary, the need for more tests. Vaccines  Your health care provider may recommend certain vaccines, such as: Influenza vaccine. This is recommended every year. Tetanus, diphtheria, and acellular  pertussis (Tdap, Td) vaccine. You may need a Td  booster every 10 years. Zoster vaccine. You may need this after age 25. Pneumococcal 13-valent conjugate (PCV13) vaccine. One dose is recommended after age 58. Pneumococcal polysaccharide (PPSV23) vaccine. One dose is recommended after age 29. Talk to your health care provider about which screenings and vaccines you need and how often you need them. This information is not intended to replace advice given to you by your health care provider. Make sure you discuss any questions you have with your health care provider. Document Released: 12/01/2015 Document Revised: 07/24/2016 Document Reviewed: 09/05/2015 Elsevier Interactive Patient Education  2017 [place] Prevention in the Home Falls can cause injuries. They can happen to people of all ages. There are many things you can do to make your home safe and to help prevent falls. What can I do on the outside of my home? Regularly fix the edges of walkways and driveways and fix any cracks. Remove anything that might make you trip as you walk through a door, such as a raised step or threshold. Trim any bushes or trees on the path to your home. Use bright outdoor lighting. Clear any walking paths of anything that might make someone trip, such as rocks or tools. Regularly check to see if handrails are loose or broken. Make sure that both sides of any steps have handrails. Any raised decks and porches should have guardrails on the edges. Have any leaves, snow, or ice cleared regularly. Use sand or salt on walking paths during winter. Clean up any spills in your garage right away. This includes oil or grease spills. What can I do in the bathroom? Use night lights. Install grab bars by the toilet and in the tub and shower. Do not use towel bars as grab bars. Use non-skid mats or decals in the tub or shower. If you need to sit down in the shower, use a plastic, non-slip stool. Keep the floor dry. Clean up any water that spills on the floor  as soon as it happens. Remove soap buildup in the tub or shower regularly. Attach bath mats securely with double-sided non-slip rug tape. Do not have throw rugs and other things on the floor that can make you trip. What can I do in the bedroom? Use night lights. Make sure that you have a light by your bed that is easy to reach. Do not use any sheets or blankets that are too big for your bed. They should not hang down onto the floor. Have a firm chair that has side arms. You can use this for support while you get dressed. Do not have throw rugs and other things on the floor that can make you trip. What can I do in the kitchen? Clean up any spills right away. Avoid walking on wet floors. Keep items that you use a lot in easy-to-reach places. If you need to reach something above you, use a strong step stool that has a grab bar. Keep electrical cords out of the way. Do not use floor polish or wax that makes floors slippery. If you must use wax, use non-skid floor wax. Do not have throw rugs and other things on the floor that can make you trip. What can I do with my stairs? Do not leave any items on the stairs. Make sure that there are handrails on both sides of the stairs and use them. Fix handrails that are broken or loose. Make sure that handrails  are as long as the stairways. Check any carpeting to make sure that it is firmly attached to the stairs. Fix any carpet that is loose or worn. Avoid having throw rugs at the top or bottom of the stairs. If you do have throw rugs, attach them to the floor with carpet tape. Make sure that you have a light switch at the top of the stairs and the bottom of the stairs. If you do not have them, ask someone to add them for you. What else can I do to help prevent falls? Wear shoes that: Do not have high heels. Have rubber bottoms. Are comfortable and fit you well. Are closed at the toe. Do not wear sandals. If you use a stepladder: Make sure that it is  fully opened. Do not climb a closed stepladder. Make sure that both sides of the stepladder are locked into place. Ask someone to hold it for you, if possible. Clearly mark and make sure that you can see: Any grab bars or handrails. First and last steps. Where the edge of each step is. Use tools that help you move around (mobility aids) if they are needed. These include: Canes. Walkers. Scooters. Crutches. Turn on the lights when you go into a dark area. Replace any light bulbs as soon as they burn out. Set up your furniture so you have a clear path. Avoid moving your furniture around. If any of your floors are uneven, fix them. If there are any pets around you, be aware of where they are. Review your medicines with your doctor. Some medicines can make you feel dizzy. This can increase your chance of falling. Ask your doctor what other things that you can do to help prevent falls. This information is not intended to replace advice given to you by your health care provider. Make sure you discuss any questions you have with your health care provider. Document Released: 08/31/2009 Document Revised: 04/11/2016 Document Reviewed: 12/09/2014 Elsevier Interactive Patient Education  2017 [employer].

## 2021-08-20 ENCOUNTER — Other Ambulatory Visit: Payer: Self-pay | Admitting: Family Medicine

## 2021-08-20 DIAGNOSIS — K625 Hemorrhage of anus and rectum: Secondary | ICD-10-CM

## 2021-09-27 DIAGNOSIS — K5909 Other constipation: Secondary | ICD-10-CM | POA: Diagnosis not present

## 2021-09-27 DIAGNOSIS — Z86010 Personal history of colonic polyps: Secondary | ICD-10-CM | POA: Diagnosis not present

## 2021-09-27 DIAGNOSIS — D509 Iron deficiency anemia, unspecified: Secondary | ICD-10-CM | POA: Diagnosis not present

## 2021-09-27 DIAGNOSIS — E119 Type 2 diabetes mellitus without complications: Secondary | ICD-10-CM | POA: Diagnosis not present

## 2021-09-30 DIAGNOSIS — K5909 Other constipation: Secondary | ICD-10-CM | POA: Insufficient documentation

## 2021-10-05 DIAGNOSIS — D509 Iron deficiency anemia, unspecified: Secondary | ICD-10-CM | POA: Diagnosis not present

## 2021-10-15 ENCOUNTER — Encounter: Payer: Self-pay | Admitting: *Deleted

## 2021-10-16 ENCOUNTER — Encounter: Payer: Self-pay | Admitting: *Deleted

## 2021-10-16 ENCOUNTER — Encounter
Admission: RE | Disposition: A | Discharge status: Home / Self Care | Payer: Self-pay | Source: Ambulatory Visit | Attending: Gastroenterology

## 2021-10-16 ENCOUNTER — Ambulatory Visit: Payer: Medicare Other | Admitting: Anesthesiology

## 2021-10-16 ENCOUNTER — Ambulatory Visit
Admission: RE | Admit: 2021-10-16 | Discharge: 2021-10-16 | Disposition: A | Discharge status: Home / Self Care | Payer: Medicare Other | Source: Ambulatory Visit | Attending: Gastroenterology | Admitting: Gastroenterology

## 2021-10-16 DIAGNOSIS — K222 Esophageal obstruction: Secondary | ICD-10-CM | POA: Diagnosis not present

## 2021-10-16 DIAGNOSIS — K5900 Constipation, unspecified: Secondary | ICD-10-CM | POA: Diagnosis not present

## 2021-10-16 DIAGNOSIS — K2970 Gastritis, unspecified, without bleeding: Secondary | ICD-10-CM | POA: Diagnosis not present

## 2021-10-16 DIAGNOSIS — K449 Diaphragmatic hernia without obstruction or gangrene: Secondary | ICD-10-CM | POA: Diagnosis not present

## 2021-10-16 DIAGNOSIS — D509 Iron deficiency anemia, unspecified: Secondary | ICD-10-CM | POA: Insufficient documentation

## 2021-10-16 DIAGNOSIS — K219 Gastro-esophageal reflux disease without esophagitis: Secondary | ICD-10-CM | POA: Diagnosis not present

## 2021-10-16 DIAGNOSIS — I10 Essential (primary) hypertension: Secondary | ICD-10-CM | POA: Diagnosis not present

## 2021-10-16 DIAGNOSIS — K649 Unspecified hemorrhoids: Secondary | ICD-10-CM | POA: Diagnosis not present

## 2021-10-16 DIAGNOSIS — K2960 Other gastritis without bleeding: Secondary | ICD-10-CM | POA: Insufficient documentation

## 2021-10-16 DIAGNOSIS — K5730 Diverticulosis of large intestine without perforation or abscess without bleeding: Secondary | ICD-10-CM | POA: Diagnosis not present

## 2021-10-16 DIAGNOSIS — E119 Type 2 diabetes mellitus without complications: Secondary | ICD-10-CM | POA: Insufficient documentation

## 2021-10-16 DIAGNOSIS — K640 First degree hemorrhoids: Secondary | ICD-10-CM | POA: Insufficient documentation

## 2021-10-16 HISTORY — DX: Polyp of colon: K63.5

## 2021-10-16 HISTORY — PX: COLONOSCOPY WITH PROPOFOL: SHX5780

## 2021-10-16 HISTORY — DX: Other forms of angina pectoris: I20.8

## 2021-10-16 HISTORY — DX: Dysphagia, unspecified: R13.10

## 2021-10-16 HISTORY — PX: ESOPHAGOGASTRODUODENOSCOPY (EGD) WITH PROPOFOL: SHX5813

## 2021-10-16 HISTORY — DX: Gastro-esophageal reflux disease without esophagitis: K21.9

## 2021-10-16 HISTORY — DX: Other forms of angina pectoris: I20.89

## 2021-10-16 HISTORY — DX: Unspecified osteoarthritis, unspecified site: M19.90

## 2021-10-16 LAB — GLUCOSE, CAPILLARY: Glucose-Capillary: 93 mg/dL (ref 70–99)

## 2021-10-16 SURGERY — COLONOSCOPY WITH PROPOFOL
Anesthesia: General

## 2021-10-16 MED ORDER — SODIUM CHLORIDE 0.9 % IV SOLN: INTRAVENOUS | Status: DC | PRN

## 2021-10-16 MED ORDER — PROPOFOL 10 MG/ML IV BOLUS
INTRAVENOUS | Status: DC | PRN
  Administered 2021-10-16: 50 mg via INTRAVENOUS

## 2021-10-16 MED ORDER — PROPOFOL 500 MG/50ML IV EMUL
INTRAVENOUS | Status: DC | PRN
  Administered 2021-10-16: 165 ug/kg/min via INTRAVENOUS

## 2021-10-16 MED ORDER — PROPOFOL 500 MG/50ML IV EMUL
INTRAVENOUS | Status: AC
  Filled 2021-10-16: qty 50

## 2021-10-16 NOTE — Transfer of Care (Signed)
Immediate Anesthesia Transfer of Care Note  Patient: BRIEANA SHIMMIN  Procedure(s) Performed: COLONOSCOPY WITH PROPOFOL ESOPHAGOGASTRODUODENOSCOPY (EGD) WITH PROPOFOL  Patient Location: PACU  Anesthesia Type:General  Level of Consciousness: awake and alert   Airway & Oxygen Therapy: Patient Spontanous Breathing and Patient connected to nasal cannula oxygen  Post-op Assessment: Report given to RN and Post -op Vital signs reviewed and stable  Post vital signs: Reviewed and stable  Last Vitals:  Vitals Value Taken Time  BP 107/60 10/16/21 1355  Temp 36.1 C 10/16/21 1353  Pulse 71 10/16/21 1355  Resp 14 10/16/21 1355  SpO2 95 % 10/16/21 1355  Vitals shown include unvalidated device data.  Last Pain:  Vitals:   10/16/21 1353  TempSrc: Temporal  PainSc: Asleep      Patients Stated Pain Goal: 0 (96/22/29 7989)  Complications: No notable events documented.

## 2021-10-16 NOTE — H&P (Signed)
Outpatient short stay form Pre-procedure 10/16/2021  Lesly Rubenstein, MD  Primary Physician: Susy Frizzle, MD  Reason for visit:  IDA  History of present illness:   85 y/o lady with constipation but found to have IDA here for EGD/Colonoscopy. Had unremarkable EGD/Colonoscopy in 2018. No blood thinners. No significant abdominal surgeries. No family history of GI malignancies.   No current facility-administered medications for this encounter.  Medications Prior to Admission  Medication Sig Dispense Refill Last Dose   aspirin EC 81 MG tablet Take 81 mg by mouth every other day.    Past Week   Blood Glucose Monitoring Suppl (ACCU-CHEK AVIVA PLUS) w/Device KIT PLEASE DISPENSE PER PATIENT'S INSURANCE 1 kit 0 10/16/2021   glucose blood (ACCU-CHEK AVIVA PLUS) test strip Use as instructed to monitor FSBS 1x daily. Dx: E11.9. 100 each 12 10/16/2021   hydrochlorothiazide (HYDRODIURIL) 25 MG tablet Take 1 tablet (25 mg total) by mouth daily. 90 tablet 1 10/15/2021   losartan (COZAAR) 100 MG tablet TAKE 1 TABLET(100 MG) BY MOUTH DAILY 90 tablet 3 10/15/2021   metFORMIN (GLUCOPHAGE) 500 MG tablet TAKE 1 TABLET(500 MG) BY MOUTH TWICE DAILY WITH A MEAL 180 tablet 3 10/15/2021   Multiple Vitamins-Minerals (MULTIVITAMIN ADULT PO) Take by mouth.   10/15/2021   oxybutynin (DITROPAN) 5 MG tablet Take 1 tablet (5 mg total) by mouth every 8 (eight) hours as needed for bladder spasms. 30 tablet 0 Past Week   pantoprazole (PROTONIX) 40 MG tablet TAKE 1 TABLET(40 MG) BY MOUTH TWICE DAILY 60 tablet 10 10/15/2021   pravastatin (PRAVACHOL) 40 MG tablet TAKE 1 TABLET BY MOUTH EVERY DAY 90 tablet 1 10/15/2021   amoxicillin-clavulanate (AUGMENTIN) 875-125 MG tablet Take 1 tablet by mouth 2 (two) times daily. (Patient not taking: Reported on 08/17/2021) 20 tablet 0    clobetasol cream (TEMOVATE) 0.05 %       triamcinolone cream (KENALOG) 0.1 % Apply 1 application topically 2 (two) times daily. 30 g 0      Not  on File   Past Medical History:  Diagnosis Date   Angina at rest [facility])    Arthritis    Colon polyps    Diabetes mellitus without complication ([place])    Dysphagia    GERD (gastroesophageal reflux disease)    Hyperlipidemia    Hyperlipidemia    Hypertension    Iron deficiency anemia     Review of systems:  Otherwise negative.    Physical Exam  Gen: Alert, oriented. Appears stated age.  HEENT: PERRLA. Lungs: No respiratory distress CV: RRR Abd: soft, benign, no masses Ext: No edema    Planned procedures: Proceed with EGD/colonoscopy. The patient understands the nature of the planned procedure, indications, risks, alternatives and potential complications including but not limited to bleeding, infection, perforation, damage to internal organs and possible oversedation/side effects from anesthesia. The patient agrees and gives consent to proceed.  Please refer to procedure notes for findings, recommendations and patient disposition/instructions.     Lesly Rubenstein, MD [facility]

## 2021-10-16 NOTE — Interval H&P Note (Signed)
History and Physical Interval Note:  10/16/2021 1:12 PM  Sherri Leon  has presented today for surgery, with the diagnosis of IDA COLON POLYPS, DIVERTICULOSIS DYSPHAGIA, MILD SCHATZKI RING, [facility].  The various methods of treatment have been discussed with the patient and family. After consideration of risks, benefits and other options for treatment, the patient has consented to  Procedure(s): COLONOSCOPY WITH PROPOFOL (N/A) ESOPHAGOGASTRODUODENOSCOPY (EGD) WITH PROPOFOL (N/A) as a surgical intervention.  The patient's history has been reviewed, patient examined, no change in status, stable for surgery.  I have reviewed the patient's chart and labs.  Questions were answered to the patient's satisfaction.     Lesly Rubenstein  Ok to proceed with EGD/Colonoscopy

## 2021-10-16 NOTE — Op Note (Signed)
[facility]astroenterology Patient Name: Sherri Leon Procedure Date: 10/16/2021 12:37 PM MRN: [id number] Account #: [id number] Date of Birth: February 19, 1936 Admit Type: Outpatient Age: 85 Room: [facility] ENDO ROOM 1 Gender: Female Note Status: Finalized Instrument Name: Colonoscope [id number] Procedure:             Colonoscopy Indications:           Iron deficiency anemia Providers:             Andrey Farmer MD, MD Referring MD:          Cammie Mcgee. Pickard (Referring MD) Medicines:             Monitored Anesthesia Care Complications:         No immediate complications. Procedure:             Pre-Anesthesia Assessment:                        - Prior to the procedure, a History and Physical was                         performed, and patient medications and allergies were                         reviewed. The patient is competent. The risks and                         benefits of the procedure and the sedation options and                         risks were discussed with the patient. All questions                         were answered and informed consent was obtained.                         Patient identification and proposed procedure were                         verified by the physician, the nurse, the anesthetist                         and the technician in the endoscopy suite. Mental                         Status Examination: alert and oriented. Airway                         Examination: normal oropharyngeal airway and neck                         mobility. Respiratory Examination: clear to                         auscultation. CV Examination: normal. Prophylactic                         Antibiotics: The patient does not require prophylactic  antibiotics. Prior Anticoagulants: The patient has                         taken no previous anticoagulant or antiplatelet                         agents. ASA Grade Assessment: II - A patient with mild                          systemic disease. After reviewing the risks and                         benefits, the patient was deemed in satisfactory                         condition to undergo the procedure. The anesthesia                         plan was to use monitored anesthesia care (MAC).                         Immediately prior to administration of medications,                         the patient was re-assessed for adequacy to receive                         sedatives. The heart rate, respiratory rate, oxygen                         saturations, blood pressure, adequacy of pulmonary                         ventilation, and response to care were monitored                         throughout the procedure. The physical status of the                         patient was re-assessed after the procedure.                        After obtaining informed consent, the colonoscope was                         passed under direct vision. Throughout the procedure,                         the patient's blood pressure, pulse, and oxygen                         saturations were monitored continuously. The                         Colonoscope was introduced through the anus and                         advanced to the the terminal ileum. The colonoscopy  was performed without difficulty. The patient                         tolerated the procedure well. The quality of the bowel                         preparation was fair. Findings:      The perianal and digital rectal examinations were normal.      The terminal ileum appeared normal.      Multiple small-mouthed diverticula were found in the sigmoid colon.      Internal hemorrhoids were found during retroflexion. The hemorrhoids       were Grade I (internal hemorrhoids that do not prolapse).      The exam was otherwise without abnormality on direct and retroflexion       views. Impression:            - Preparation of the colon was  fair.                        - The examined portion of the ileum was normal.                        - Diverticulosis in the sigmoid colon.                        - Internal hemorrhoids.                        - The examination was otherwise normal on direct and                         retroflexion views.                        - No specimens collected. Recommendation:        - Discharge patient to home.                        - Resume previous diet.                        - Continue present medications.                        - Recommend trial of iron either PO or IV and                         rechecking iron studies. If no improvement would need                         a VCE at that point.                        - Return to referring physician as previously                         scheduled. Procedure Code(s):     --- Professional ---                        [phone], Colonoscopy, flexible; diagnostic, including  collection of specimen(s) by brushing or washing, when                         performed (separate procedure) Diagnosis Code(s):     --- Professional ---                        K64.0, First degree hemorrhoids                        D50.9, Iron deficiency anemia, unspecified                        K57.30, Diverticulosis of large intestine without                         perforation or abscess without bleeding CPT copyright 2019 [employer]. All rights reserved. The codes documented in this report are preliminary and upon coder review may  be revised to meet current compliance requirements. Andrey Farmer MD, MD 10/16/2021 1:56:16 PM Number of Addenda: 0 Note Initiated On: 10/16/2021 12:37 PM Scope Withdrawal Time: 0 hours 10 minutes 22 seconds  Total Procedure Duration: 0 hours 18 minutes 15 seconds  Estimated Blood Loss:  Estimated blood loss: none.      [facility]

## 2021-10-16 NOTE — Anesthesia Postprocedure Evaluation (Signed)
Anesthesia Post Note  Patient: LUANNE KRZYZANOWSKI  Procedure(s) Performed: COLONOSCOPY WITH PROPOFOL ESOPHAGOGASTRODUODENOSCOPY (EGD) WITH PROPOFOL  Patient location during evaluation: PACU Anesthesia Type: General Level of consciousness: awake and alert, oriented and patient cooperative Pain management: pain level controlled Vital Signs Assessment: post-procedure vital signs reviewed and stable Respiratory status: spontaneous breathing, nonlabored ventilation and respiratory function stable Cardiovascular status: blood pressure returned to baseline and stable Postop Assessment: adequate PO intake Anesthetic complications: no   No notable events documented.   Last Vitals:  Vitals:   10/16/21 1403 10/16/21 1413  BP: 113/62 138/74  Pulse: 67 65  Resp: 17 16  Temp:    SpO2: 99% 100%    Last Pain:  Vitals:   10/16/21 1413  TempSrc:   PainSc: 0-No pain                 Darrin Nipper

## 2021-10-16 NOTE — Op Note (Signed)
[facility] Patient Name: Sherri Leon Procedure Date: 10/16/2021 12:38 PM MRN: [id number] Account #: [id number] Date of Birth: 03-Oct-1936 Admit Type: Outpatient Age: 85 Room: [facility] ENDO ROOM 1 Gender: Female Note Status: Finalized Instrument Name: Altamese Cabal Endoscope [id number] Procedure:             Upper GI endoscopy Indications:           Iron deficiency anemia Providers:             Andrey Farmer MD, MD Referring MD:          Cammie Mcgee. Pickard (Referring MD) Medicines:             Monitored Anesthesia Care Complications:         No immediate complications. Estimated blood loss:                         Minimal. Procedure:             Pre-Anesthesia Assessment:                        - Prior to the procedure, a History and Physical was                         performed, and patient medications and allergies were                         reviewed. The patient is competent. The risks and                         benefits of the procedure and the sedation options and                         risks were discussed with the patient. All questions                         were answered and informed consent was obtained.                         Patient identification and proposed procedure were                         verified by the physician, the nurse, the anesthetist                         and the technician in the endoscopy suite. Mental                         Status Examination: alert and oriented. Airway                         Examination: normal oropharyngeal airway and neck                         mobility. Respiratory Examination: clear to                         auscultation. CV Examination: normal. Prophylactic  Antibiotics: The patient does not require prophylactic                         antibiotics. Prior Anticoagulants: The patient has                         taken no previous anticoagulant or antiplatelet                          agents. ASA Grade Assessment: II - A patient with mild                         systemic disease. After reviewing the risks and                         benefits, the patient was deemed in satisfactory                         condition to undergo the procedure. The anesthesia                         plan was to use monitored anesthesia care (MAC).                         Immediately prior to administration of medications,                         the patient was re-assessed for adequacy to receive                         sedatives. The heart rate, respiratory rate, oxygen                         saturations, blood pressure, adequacy of pulmonary                         ventilation, and response to care were monitored                         throughout the procedure. The physical status of the                         patient was re-assessed after the procedure.                        After obtaining informed consent, the endoscope was                         passed under direct vision. Throughout the procedure,                         the patient's blood pressure, pulse, and oxygen                         saturations were monitored continuously. The Endoscope                         was introduced through the mouth, and advanced to the  second part of duodenum. The upper GI endoscopy was                         accomplished without difficulty. The patient tolerated                         the procedure well. Findings:      A 4 cm hiatal hernia was present.      The exam of the esophagus was otherwise normal.      Localized mild inflammation characterized by erythema was found at the       pylorus. Biopsies were taken with a cold forceps for histology.       Estimated blood loss was minimal.      The exam of the stomach was otherwise normal.      The examined duodenum was normal. Impression:            - 4 cm hiatal hernia.                        -  Gastritis. Biopsied.                        - Normal examined duodenum. Recommendation:        - Await pathology results.                        - Perform a colonoscopy today. Procedure Code(s):     --- Professional ---                        [phone], Esophagogastroduodenoscopy, flexible,                         transoral; with biopsy, single or multiple Diagnosis Code(s):     --- Professional ---                        K44.9, Diaphragmatic hernia without obstruction or                         gangrene                        K29.70, Gastritis, unspecified, without bleeding                        D50.9, Iron deficiency anemia, unspecified CPT copyright 2019 American Medical Association. All rights reserved. The codes documented in this report are preliminary and upon coder review may  be revised to meet current compliance requirements. Andrey Farmer MD, MD 10/16/2021 1:53:16 PM Number of Addenda: 0 Note Initiated On: 10/16/2021 12:38 PM Estimated Blood Loss:  Estimated blood loss was minimal.      [facility]

## 2021-10-16 NOTE — Anesthesia Preprocedure Evaluation (Signed)
Anesthesia Evaluation  Patient identified by MRN, date of birth, ID band Patient awake    Reviewed: Allergy & Precautions, NPO status , Patient's Chart, lab work & pertinent test results  History of Anesthesia Complications Negative for: history of anesthetic complications  Airway Mallampati: IV   Neck ROM: Full    Dental  (+)  Missing many molars:   Pulmonary neg pulmonary ROS,    Pulmonary exam normal breath sounds clear to auscultation       Cardiovascular hypertension, Normal cardiovascular exam Rhythm:Regular Rate:Normal     Neuro/Psych negative neurological ROS     GI/Hepatic GERD  ,Dysphagia    Endo/Other  diabetes  Renal/GU negative Renal ROS     Musculoskeletal   Abdominal   Peds  Hematology  (+) Blood dyscrasia, anemia ,   Anesthesia Other Findings   Reproductive/Obstetrics                             Anesthesia Physical Anesthesia Plan  ASA: 2  Anesthesia Plan: General   Post-op Pain Management:    Induction: Intravenous  PONV Risk Score and Plan: 3 and Propofol infusion, TIVA and Treatment may vary due to age or medical condition  Airway Management Planned: Natural Airway  Additional Equipment:   Intra-op Plan:   Post-operative Plan:   Informed Consent: I have reviewed the patients History and Physical, chart, labs and discussed the procedure including the risks, benefits and alternatives for the proposed anesthesia with the patient or authorized representative who has indicated his/her understanding and acceptance.       Plan Discussed with: CRNA  Anesthesia Plan Comments: (LMA/GETA backup discussed.  Patient consented for risks of anesthesia including but not limited to:  - adverse reactions to medications - damage to eyes, teeth, lips or other oral mucosa - nerve damage due to positioning  - sore throat or hoarseness - damage to heart, brain,  nerves, lungs, other parts of body or loss of life  Informed patient about role of CRNA in peri- and intra-operative care.  Patient voiced understanding.)        Anesthesia Quick Evaluation

## 2021-10-17 ENCOUNTER — Encounter: Payer: Self-pay | Admitting: Gastroenterology

## 2021-10-17 LAB — SURGICAL PATHOLOGY

## 2021-10-22 ENCOUNTER — Inpatient Hospital Stay: Payer: Medicare Other | Attending: Oncology | Admitting: Oncology

## 2021-10-22 ENCOUNTER — Inpatient Hospital Stay: Payer: Medicare Other

## 2021-11-16 ENCOUNTER — Other Ambulatory Visit: Payer: Self-pay | Admitting: Family Medicine

## 2021-11-16 DIAGNOSIS — R1319 Other dysphagia: Secondary | ICD-10-CM

## 2021-11-26 ENCOUNTER — Other Ambulatory Visit: Payer: Self-pay

## 2021-11-26 ENCOUNTER — Encounter: Payer: Self-pay | Admitting: Family Medicine

## 2021-11-26 ENCOUNTER — Other Ambulatory Visit: Payer: Self-pay | Admitting: Family Medicine

## 2021-11-26 ENCOUNTER — Ambulatory Visit (INDEPENDENT_AMBULATORY_CARE_PROVIDER_SITE_OTHER): Payer: Medicare Other | Admitting: Family Medicine

## 2021-11-26 VITALS — BP 128/72 | HR 59 | Temp 97.0°F | Resp 18 | Ht 67.0 in | Wt 162.0 lb

## 2021-11-26 DIAGNOSIS — I10 Essential (primary) hypertension: Secondary | ICD-10-CM

## 2021-11-26 DIAGNOSIS — N3281 Overactive bladder: Secondary | ICD-10-CM | POA: Diagnosis not present

## 2021-11-26 DIAGNOSIS — E118 Type 2 diabetes mellitus with unspecified complications: Secondary | ICD-10-CM

## 2021-11-26 MED ORDER — LEVOCETIRIZINE DIHYDROCHLORIDE 5 MG PO TABS: 5.0000 mg | ORAL_TABLET | Freq: Every evening | ORAL | 0 refills | Status: DC

## 2021-11-26 MED ORDER — OXYBUTYNIN CHLORIDE 5 MG PO TABS: 5.0000 mg | ORAL_TABLET | Freq: Three times a day (TID) | ORAL | 3 refills | Status: DC | PRN

## 2021-11-26 NOTE — Progress Notes (Signed)
Subjective:    Patient ID: Sherri Leon, female    DOB: 10-24-1936, 86 y.o.   MRN: [id number]  Patient is a very sweet 86 year old Caucasian female who is here today for a checkup.  She has a history of type 2 diabetes mellitus.  She is checking her blood sugars in the morning and they are typically under 120.  She denies any polydipsia or blurry vision.  However she has been experiencing polyuria.  She has a history of overactive bladder.  In the past, we gave her oxybutynin for this.  We also cautioned her against dizziness and sedation on the medicine.  She saw benefit from it and would like a refill on the medication.  She states that she gets up sometimes as many as 10 times at night to urinate.  She denies any dysuria or urgency or hesitancy.  She denies any hematuria.  She has had a cough for the last 2 weeks.  She states that she has postnasal drip and rhinorrhea.  When she lays down at night around 2 or 3 in the morning she will start coughing and this keeps her from resting.  She has been using cold medication.  She denies any sinus pain or fevers or chills or shortness of breath or chest pain     Past Medical History:  Diagnosis Date   Angina at rest [facility])    Arthritis    Colon polyps    Diabetes mellitus without complication ([place])    Dysphagia    GERD (gastroesophageal reflux disease)    Hyperlipidemia    Hyperlipidemia    Hypertension    Iron deficiency anemia    Past Surgical History:  Procedure Laterality Date   [place] N/A 08/23/2015   Procedure: Left Heart Cath;  Surgeon: Leonie Man, MD;  Location: [place] CV LAB;  Service: Cardiovascular;  Laterality: N/A;   COLONOSCOPY WITH PROPOFOL N/A 06/13/2017   Procedure: COLONOSCOPY WITH PROPOFOL;  Surgeon: Manya Silvas, MD;  Location: [facility] ENDOSCOPY;  Service: Endoscopy;  Laterality: N/A;   COLONOSCOPY WITH PROPOFOL  N/A 10/16/2021   Procedure: COLONOSCOPY WITH PROPOFOL;  Surgeon: Lesly Rubenstein, MD;  Location: [facility] ENDOSCOPY;  Service: Endoscopy;  Laterality: N/A;   ESOPHAGOGASTRODUODENOSCOPY (EGD) WITH PROPOFOL N/A 06/13/2017   Procedure: ESOPHAGOGASTRODUODENOSCOPY (EGD) WITH PROPOFOL;  Surgeon: Manya Silvas, MD;  Location: [facility] ENDOSCOPY;  Service: Endoscopy;  Laterality: N/A;   ESOPHAGOGASTRODUODENOSCOPY (EGD) WITH PROPOFOL N/A 10/16/2021   Procedure: ESOPHAGOGASTRODUODENOSCOPY (EGD) WITH PROPOFOL;  Surgeon: Lesly Rubenstein, MD;  Location: [facility] ENDOSCOPY;  Service: Endoscopy;  Laterality: N/A;   HERNIA REPAIR     TONSILLECTOMY  1955   Current Outpatient Medications on File Prior to Visit  Medication Sig Dispense Refill   aspirin EC 81 MG tablet Take 81 mg by mouth every other day.      Blood Glucose Monitoring Suppl (ACCU-CHEK AVIVA PLUS) w/Device KIT PLEASE DISPENSE PER PATIENT'S INSURANCE 1 kit 0   clobetasol cream (TEMOVATE) 0.05 %      ferrous sulfate 325 (65 FE) MG EC tablet Take 1 tablet by mouth every morning.     glucose blood (ACCU-CHEK AVIVA PLUS) test strip Use as instructed to monitor FSBS 1x daily. Dx: E11.9. 100 each 12   hydrochlorothiazide (HYDRODIURIL) 25 MG tablet Take 1 tablet (25 mg total) by mouth daily. 90 tablet 1   losartan (COZAAR)  100 MG tablet TAKE 1 TABLET(100 MG) BY MOUTH DAILY 90 tablet 3   metFORMIN (GLUCOPHAGE) 500 MG tablet TAKE 1 TABLET(500 MG) BY MOUTH TWICE DAILY WITH A MEAL 180 tablet 3   Multiple Vitamins-Minerals (MULTIVITAMIN ADULT PO) Take by mouth.     oxybutynin (DITROPAN) 5 MG tablet Take 1 tablet (5 mg total) by mouth every 8 (eight) hours as needed for bladder spasms. 30 tablet 0   pantoprazole (PROTONIX) 40 MG tablet TAKE 1 TABLET(40 MG) BY MOUTH TWICE DAILY 60 tablet 2   pravastatin (PRAVACHOL) 40 MG tablet TAKE 1 TABLET BY MOUTH EVERY DAY 90 tablet 1   triamcinolone cream (KENALOG) 0.1 % Apply 1 application topically 2 (two) times daily. 30  g 0   No current facility-administered medications on file prior to visit.   No Known Allergies Social History   Socioeconomic History   Marital status: Married    Spouse name: Not on file   Number of children: 1   Years of education: Not on file   Highest education level: Not on file  Occupational History   Not on file  Tobacco Use   Smoking status: Never   Smokeless tobacco: Never  Vaping Use   Vaping Use: Never used  Substance and Sexual Activity   Alcohol use: No   Drug use: No   Sexual activity: Not Currently  Other Topics Concern   Not on file  Social History Narrative   ** Merged History Encounter **       Social Determinants of Health   Financial Resource Strain: Low Risk    Difficulty of Paying Living Expenses: Not hard at all  Food Insecurity: No Food Insecurity   Worried About Charity fundraiser in the Last Year: Never true   [place] in the Last Year: Never true  Transportation Needs: No Transportation Needs   Lack of Transportation (Medical): No   Lack of Transportation (Non-Medical): No  Physical Activity: Sufficiently Active   Days of Exercise per Week: 5 days   Minutes of Exercise per Session: 60 min  Stress: No Stress Concern Present   Feeling of Stress : Not at all  Social Connections: Moderately Integrated   Frequency of Communication with Friends and Family: More than three times a week   Frequency of Social Gatherings with Friends and Family: More than three times a week   Attends Religious Services: More than 4 times per year   Active Member of [employer] or Organizations: Yes   Attends Archivist Meetings: More than 4 times per year   Marital Status: Widowed  Human resources officer Violence: Not At Risk   Fear of Current or Ex-Partner: No   Emotionally Abused: No   Physically Abused: No   Sexually Abused: No      Review of Systems  All other systems reviewed and are negative.     Objective:   Physical Exam Vitals  reviewed.  Constitutional:      General: She is not in acute distress.    Appearance: She is well-developed. She is not diaphoretic.  Eyes:     Conjunctiva/sclera: Conjunctivae normal.     Pupils: Pupils are equal, round, and reactive to light.  Neck:     Thyroid: No thyromegaly.     Vascular: No JVD.  Cardiovascular:     Rate and Rhythm: Normal rate and regular rhythm.     Heart sounds: Normal heart sounds. No murmur heard.   No friction rub.  No gallop.  Pulmonary:     Effort: Pulmonary effort is normal. No respiratory distress.     Breath sounds: Normal breath sounds. No stridor. No wheezing or rales.  Lymphadenopathy:     Cervical: No cervical adenopathy.          Assessment & Plan:  OAB (overactive bladder) - Plan: Urinalysis, Routine w reflex microscopic  Controlled type 2 diabetes mellitus with complication, without long-term current use of insulin (HCC) - Plan: Lipid panel, COMPLETE METABOLIC PANEL WITH GFR, CBC with Differential/Platelet, Hemoglobin A1c  Benign essential HTN - Plan: Lipid panel, COMPLETE METABOLIC PANEL WITH GFR, CBC with Differential/Platelet, Hemoglobin A1c Diabetes sounds well controlled.  I will check hemoglobin A1c.  Goal A1c is less than 7.  Also check CMP and lipid panel.  Goal LDL cholesterol is less than 100.  Blood pressure today is well within normal range.  I believe her symptoms sound like overactive bladder.  We discussed options and she prefers a cheaper option with oxybutynin 5 mg p.o. nightly.  She will only take it at night.  If she gets dizzy or sleepy or groggy from the medication to stop the medication and notify me.  I do believe that most of her cough is due to postnasal drip I will try Xyzal for postnasal drip and head congestion.

## 2021-11-27 ENCOUNTER — Telehealth: Payer: Self-pay

## 2021-11-27 LAB — CBC WITH DIFFERENTIAL/PLATELET
Absolute Monocytes: 491 cells/uL (ref 200–950)
Basophils Absolute: 32 cells/uL (ref 0–200)
Basophils Relative: 0.5 %
Eosinophils Absolute: 113 cells/uL (ref 15–500)
Eosinophils Relative: 1.8 %
HCT: 38.3 % (ref 35.0–45.0)
Hemoglobin: 12.1 g/dL (ref 11.7–15.5)
Lymphs Abs: 2167 cells/uL (ref 850–3900)
MCH: 25.9 pg — ABNORMAL LOW (ref 27.0–33.0)
MCHC: 31.6 g/dL — ABNORMAL LOW (ref 32.0–36.0)
MCV: 81.8 fL (ref 80.0–100.0)
MPV: 12 fL (ref 7.5–12.5)
Monocytes Relative: 7.8 %
Neutro Abs: 3497 cells/uL (ref 1500–7800)
Neutrophils Relative %: 55.5 %
Platelets: 225 10*3/uL (ref 140–400)
RBC: 4.68 10*6/uL (ref 3.80–5.10)
RDW: 17.3 % — ABNORMAL HIGH (ref 11.0–15.0)
Total Lymphocyte: 34.4 %
WBC: 6.3 10*3/uL (ref 3.8–10.8)

## 2021-11-27 LAB — COMPLETE METABOLIC PANEL WITH GFR
AG Ratio: 1.9 (calc) (ref 1.0–2.5)
ALT: 16 U/L (ref 6–29)
AST: 22 U/L (ref 10–35)
Albumin: 4.2 g/dL (ref 3.6–5.1)
Alkaline phosphatase (APISO): 81 U/L (ref 37–153)
BUN: 11 mg/dL (ref 7–25)
CO2: 26 mmol/L (ref 20–32)
Calcium: 9.7 mg/dL (ref 8.6–10.4)
Chloride: 106 mmol/L (ref 98–110)
Creat: 0.66 mg/dL (ref 0.60–0.95)
Globulin: 2.2 g/dL (calc) (ref 1.9–3.7)
Glucose, Bld: 82 mg/dL (ref 65–99)
Potassium: 3.9 mmol/L (ref 3.5–5.3)
Sodium: 142 mmol/L (ref 135–146)
Total Bilirubin: 0.3 mg/dL (ref 0.2–1.2)
Total Protein: 6.4 g/dL (ref 6.1–8.1)
eGFR: 87 mL/min/{1.73_m2} (ref >=60)

## 2021-11-27 LAB — LIPID PANEL
Cholesterol: 139 mg/dL (ref <200)
HDL: 46 mg/dL — ABNORMAL LOW (ref >=50)
LDL Cholesterol (Calc): 64 mg/dL (calc)
Non-HDL Cholesterol (Calc): 93 mg/dL (calc) (ref <130)
Total CHOL/HDL Ratio: 3 (calc) (ref <5.0)
Triglycerides: 219 mg/dL — ABNORMAL HIGH (ref <150)

## 2021-11-27 LAB — URINALYSIS, ROUTINE W REFLEX MICROSCOPIC
Bilirubin Urine: NEGATIVE
Glucose, UA: NEGATIVE
Hgb urine dipstick: NEGATIVE
Hyaline Cast: NONE SEEN /LPF
Ketones, ur: NEGATIVE
Leukocytes,Ua: NEGATIVE
Nitrite: NEGATIVE
RBC / HPF: NONE SEEN /HPF (ref 0–2)
Specific Gravity, Urine: 1.024 (ref 1.001–1.035)
pH: <=5 (ref 5.0–8.0)

## 2021-11-27 LAB — HEMOGLOBIN A1C
Hgb A1c MFr Bld: 6.1 % of total Hgb — ABNORMAL HIGH (ref <5.7)
Mean Plasma Glucose: 128 mg/dL
eAG (mmol/L): 7.1 mmol/L

## 2021-11-27 LAB — MICROSCOPIC MESSAGE

## 2021-11-27 NOTE — Telephone Encounter (Signed)
Informed patient of results

## 2021-11-27 NOTE — Telephone Encounter (Signed)
-----   Message from Susy Frizzle, MD sent at 11/27/2021  6:39 AM EST ----- Urine is normal (no infection or blood or abnormality seen).  Diabetes test and cholesterol is excellent.

## 2021-12-13 DIAGNOSIS — E113393 Type 2 diabetes mellitus with moderate nonproliferative diabetic retinopathy without macular edema, bilateral: Secondary | ICD-10-CM | POA: Diagnosis not present

## 2021-12-31 DIAGNOSIS — D509 Iron deficiency anemia, unspecified: Secondary | ICD-10-CM | POA: Diagnosis not present

## 2022-01-29 ENCOUNTER — Other Ambulatory Visit: Payer: Self-pay | Admitting: Family Medicine

## 2022-02-04 ENCOUNTER — Encounter: Payer: Self-pay | Admitting: Family Medicine

## 2022-02-04 ENCOUNTER — Ambulatory Visit (INDEPENDENT_AMBULATORY_CARE_PROVIDER_SITE_OTHER): Payer: Medicare Other | Admitting: Family Medicine

## 2022-02-04 ENCOUNTER — Other Ambulatory Visit: Payer: Self-pay

## 2022-02-04 VITALS — BP 128/62 | HR 53 | Temp 96.7°F | Ht 67.0 in | Wt 169.6 lb

## 2022-02-04 DIAGNOSIS — D509 Iron deficiency anemia, unspecified: Secondary | ICD-10-CM | POA: Diagnosis not present

## 2022-02-04 DIAGNOSIS — D649 Anemia, unspecified: Secondary | ICD-10-CM | POA: Diagnosis not present

## 2022-02-04 NOTE — Progress Notes (Signed)
? ?Subjective:  ? ? Patient ID: Sherri Leon, female    DOB: 1936/06/05, 86 y.o.   MRN: [id number] ? ?Patient is a very sweet 86 year old Caucasian female who is here today for drop in her hemoglobin.  I saw the patient in January and at that time her hemoglobin was normal at 12.1.  She has since seen another provider who stated that her hemoglobin had dropped substantially and recommended she follow-up here with me today.  She reports black stool but she has been on iron pills.  Therefore is difficult to determine if there has been any change.  She denies any chest pain.  She denies any shortness of breath.  She denies any syncope.  She denies any burning in her stomach or heartburn.  She has been taking aspirin.  She has not been taking any NSAIDs.  She denies any night sweats.  She denies any bruising.  She denies any epistaxis or bleeding gums.  She denies any vaginal bleeding or hematuria. ? ?Past Medical History:  ?Diagnosis Date  ? Angina at rest [facility])   ? Arthritis   ? Colon polyps   ? Diabetes mellitus without complication ([place])   ? Dysphagia   ? GERD (gastroesophageal reflux disease)   ? Hyperlipidemia   ? Hyperlipidemia   ? Hypertension   ? Iron deficiency anemia   ? ?Past Surgical History:  ?Procedure Laterality Date  ? ABDOMINAL HYSTERECTOMY    ? ABDOMINAL HYSTERECTOMY  1970  ? APPENDECTOMY  1970  ? CARDIAC CATHETERIZATION N/A 08/23/2015  ? Procedure: Left Heart Cath;  Surgeon: Leonie Man, MD;  Location: [place] CV LAB;  Service: Cardiovascular;  Laterality: N/A;  ? COLONOSCOPY WITH PROPOFOL N/A 06/13/2017  ? Procedure: COLONOSCOPY WITH PROPOFOL;  Surgeon: Manya Silvas, MD;  Location: [facility] ENDOSCOPY;  Service: Endoscopy;  Laterality: N/A;  ? COLONOSCOPY WITH PROPOFOL N/A 10/16/2021  ? Procedure: COLONOSCOPY WITH PROPOFOL;  Surgeon: Lesly Rubenstein, MD;  Location[facility] ENDOSCOPY;  Service: Endoscopy;  Laterality: N/A;  ? ESOPHAGOGASTRODUODENOSCOPY (EGD) WITH PROPOFOL N/A 06/13/2017  ?  Procedure: ESOPHAGOGASTRODUODENOSCOPY (EGD) WITH PROPOFOL;  Surgeon: Manya Silvas, MD;  Location: [facility] ENDOSCOPY;  Service: Endoscopy;  Laterality: N/A;  ? ESOPHAGOGASTRODUODENOSCOPY (EGD) WITH PROPOFOL N/A 10/16/2021  ? Procedure: ESOPHAGOGASTRODUODENOSCOPY (EGD) WITH PROPOFOL;  Surgeon: Lesly Rubenstein, MD;  Location: [facility] ENDOSCOPY;  Service: Endoscopy;  Laterality: N/A;  ? HERNIA REPAIR    ? TONSILLECTOMY  1955  ? ?Current Outpatient Medications on File Prior to Visit  ?Medication Sig Dispense Refill  ? aspirin EC 81 MG tablet Take 81 mg by mouth every other day.     ? Blood Glucose Monitoring Suppl (ACCU-CHEK AVIVA PLUS) w/Device KIT PLEASE DISPENSE PER PATIENT'S INSURANCE 1 kit 0  ? clobetasol cream (TEMOVATE) 0.05 %     ? ferrous sulfate 325 (65 FE) MG EC tablet Take 1 tablet by mouth every morning.    ? glucose blood (ACCU-CHEK AVIVA PLUS) test strip Use as instructed to monitor FSBS 1x daily. Dx: E11.9. 100 each 12  ? hydrochlorothiazide (HYDRODIURIL) 25 MG tablet Take 1 tablet (25 mg total) by mouth daily. 90 tablet 1  ? levocetirizine (XYZAL) 5 MG tablet TAKE 1 TABLET(5 MG) BY MOUTH EVERY EVENING 90 tablet 0  ? losartan (COZAAR) 100 MG tablet TAKE 1 TABLET(100 MG) BY MOUTH DAILY 90 tablet 3  ? metFORMIN (GLUCOPHAGE) 500 MG tablet TAKE 1 TABLET(500 MG) BY MOUTH TWICE DAILY WITH A MEAL 180 tablet 3  ?  Multiple Vitamins-Minerals (MULTIVITAMIN ADULT PO) Take by mouth.    ? oxybutynin (DITROPAN) 5 MG tablet Take 1 tablet (5 mg total) by mouth every 8 (eight) hours as needed for bladder spasms. 30 tablet 3  ? pantoprazole (PROTONIX) 40 MG tablet TAKE 1 TABLET(40 MG) BY MOUTH TWICE DAILY 60 tablet 2  ? pravastatin (PRAVACHOL) 40 MG tablet TAKE 1 TABLET BY MOUTH EVERY DAY 90 tablet 0  ? triamcinolone cream (KENALOG) 0.1 % Apply 1 application topically 2 (two) times daily. 30 g 0  ? ?No current facility-administered medications on file prior to visit.  ? ?No Known Allergies ?Social History   ? ?Socioeconomic History  ? Marital status: Married  ?  Spouse name: Not on file  ? Number of children: 1  ? Years of education: Not on file  ? Highest education level: Not on file  ?Occupational History  ? Not on file  ?Tobacco Use  ? Smoking status: Never  ? Smokeless tobacco: Never  ?Vaping Use  ? Vaping Use: Never used  ?Substance and Sexual Activity  ? Alcohol use: No  ? Drug use: No  ? Sexual activity: Not Currently  ?Other Topics Concern  ? Not on file  ?Social History Narrative  ? ** Merged History Encounter **  ?    ? ?Social Determinants of Health  ? ?Financial Resource Strain: Low Risk   ? Difficulty of Paying Living Expenses: Not hard at all  ?Food Insecurity: No Food Insecurity  ? Worried About Charity fundraiser in the Last Year: Never true  ? Ran Out of Food in the Last Year: Never true  ?Transportation Needs: No Transportation Needs  ? Lack of Transportation (Medical): No  ? Lack of Transportation (Non-Medical): No  ?Physical Activity: Sufficiently Active  ? Days of Exercise per Week: 5 days  ? Minutes of Exercise per Session: 60 min  ?Stress: No Stress Concern Present  ? Feeling of Stress : Not at all  ?Social Connections: Moderately Integrated  ? Frequency of Communication with Friends and Family: More than three times a week  ? Frequency of Social Gatherings with Friends and Family: More than three times a week  ? Attends Religious Services: More than 4 times per year  ? Active Member of Clubs or Organizations: Yes  ? Attends Archivist Meetings: More than 4 times per year  ? Marital Status: Widowed  ?Intimate Partner Violence: Not At Risk  ? Fear of Current or Ex-Partner: No  ? Emotionally Abused: No  ? Physically Abused: No  ? Sexually Abused: No  ? ? ? ? ?Review of Systems  ?All other systems reviewed and are negative. ? ?   ?Objective:  ? Physical Exam ?Vitals reviewed.  ?Constitutional:   ?   General: She is not in acute distress. ?   Appearance: She is well-developed. She is not  diaphoretic.  ?Eyes:  ?   Conjunctiva/sclera: Conjunctivae normal.  ?   Pupils: Pupils are equal, round, and reactive to light.  ?Neck:  ?   Thyroid: No thyromegaly.  ?   Vascular: No JVD.  ?Cardiovascular:  ?   Rate and Rhythm: Normal rate and regular rhythm.  ?   Heart sounds: Normal heart sounds. No murmur heard. ?  No friction rub. No gallop.  ?Pulmonary:  ?   Effort: Pulmonary effort is normal. No respiratory distress.  ?   Breath sounds: Normal breath sounds. No stridor. No wheezing or rales.  ?Lymphadenopathy:  ?  Cervical: No cervical adenopathy.  ? ? ? ? ? ?   ?Assessment & Plan:  ?Anemia, unspecified type - Plan: CBC with Differential/Platelet, Iron, TIBC and Ferritin Panel, Vitamin B12, Fecal Globin By Immunochemistry ?Recheck CBC to verify the actual amount of hemoglobin.  Check an iron panel, TIBC, ferritin, B12, and fecal occult blood.  If stool was positive for blood, I would recommend GI for endoscopy.  If iron level is low, I would recommend consultation with hematology for IV iron.  Patient is currently continuing oral iron supplementation.  I recommended discontinuing aspirin temporarily. ?

## 2022-02-06 ENCOUNTER — Telehealth: Payer: Self-pay

## 2022-02-06 NOTE — Telephone Encounter (Signed)
Pt wants to know if she should continue to take her iron? ? ?Please advice ?

## 2022-02-11 ENCOUNTER — Telehealth: Payer: Self-pay

## 2022-02-11 DIAGNOSIS — H20041 Secondary noninfectious iridocyclitis, right eye: Secondary | ICD-10-CM | POA: Diagnosis not present

## 2022-02-11 NOTE — Telephone Encounter (Signed)
Called and advice pt to continue with her iron per Dr. Dennard Schaumann ?

## 2022-02-11 NOTE — Telephone Encounter (Signed)
Spoke with pt. Expressed understanding ?

## 2022-02-12 LAB — CBC WITH DIFFERENTIAL/PLATELET
Absolute Monocytes: 375 cells/uL (ref 200–950)
Basophils Absolute: 40 cells/uL (ref 0–200)
Basophils Relative: 0.6 %
Eosinophils Absolute: 141 cells/uL (ref 15–500)
Eosinophils Relative: 2.1 %
HCT: 36.9 % (ref 35.0–45.0)
Hemoglobin: 12 g/dL (ref 11.7–15.5)
Lymphs Abs: 2533 cells/uL (ref 850–3900)
MCH: 27.1 pg (ref 27.0–33.0)
MCHC: 32.5 g/dL (ref 32.0–36.0)
MCV: 83.3 fL (ref 80.0–100.0)
MPV: 12.4 fL (ref 7.5–12.5)
Monocytes Relative: 5.6 %
Neutro Abs: 3611 cells/uL (ref 1500–7800)
Neutrophils Relative %: 53.9 %
Platelets: 226 10*3/uL (ref 140–400)
RBC: 4.43 10*6/uL (ref 3.80–5.10)
RDW: 13.4 % (ref 11.0–15.0)
Total Lymphocyte: 37.8 %
WBC: 6.7 10*3/uL (ref 3.8–10.8)

## 2022-02-12 LAB — VITAMIN B12: Vitamin B-12: 362 pg/mL (ref 200–1100)

## 2022-02-12 LAB — IRON,TIBC AND FERRITIN PANEL
%SAT: 14 % (calc) — ABNORMAL LOW (ref 16–45)
Ferritin: 10 ng/mL — ABNORMAL LOW (ref 16–288)
Iron: 51 ug/dL (ref 45–160)
TIBC: 355 mcg/dL (calc) (ref 250–450)

## 2022-02-12 LAB — FECAL GLOBIN BY IMMUNOCHEMISTRY

## 2022-03-06 ENCOUNTER — Other Ambulatory Visit: Payer: Self-pay

## 2022-03-06 DIAGNOSIS — D509 Iron deficiency anemia, unspecified: Secondary | ICD-10-CM

## 2022-04-29 ENCOUNTER — Other Ambulatory Visit: Payer: Self-pay | Admitting: Family Medicine

## 2022-04-30 NOTE — Telephone Encounter (Signed)
Requested Prescriptions  Pending Prescriptions Disp Refills  . pravastatin (PRAVACHOL) 40 MG tablet [Pharmacy Med Name: PRAVASTATIN '40MG'$  TABLETS] 90 tablet 0    Sig: TAKE 1 TABLET BY MOUTH EVERY DAY     Cardiovascular:  Antilipid - Statins Failed - 04/30/2022  8:56 AM      Failed - Lipid Panel in normal range within the last 12 months    Cholesterol  Date Value Ref Range Status  11/26/2021 139 <200 mg/dL Final   LDL Cholesterol (Calc)  Date Value Ref Range Status  11/26/2021 64 mg/dL (calc) Final    Comment:    Reference range: <100 . Desirable range <100 mg/dL for primary prevention;   <70 mg/dL for patients with CHD or diabetic patients  with > or = 2 CHD risk factors. Marland Kitchen LDL-C is now calculated using the Martin-Hopkins  calculation, which is a validated novel method providing  better accuracy than the Friedewald equation in the  estimation of LDL-C.  Cresenciano Genre et al. Annamaria Helling. 6568;127(51): 2061-2068  (http://education.[facility].com/faq/FAQ164)    HDL  Date Value Ref Range Status  11/26/2021 46 (L) > OR = 50 mg/dL Final   Triglycerides  Date Value Ref Range Status  11/26/2021 219 (H) <150 mg/dL Final    Comment:    . If a non-fasting specimen was collected, consider repeat triglyceride testing on a fasting specimen if clinically indicated.  Yates Decamp et al. J. of Clin. Lipidol. 7001;7:494-496. Marland Kitchen          Passed - Patient is not pregnant      Passed - Valid encounter within last 12 months    Recent Outpatient Visits          2 months ago Anemia, unspecified type   [place], Cammie Mcgee, MD   5 months ago OAB (overactive bladder)   [place] Susy Frizzle, MD   9 months ago Pneumonia of right lower lobe due to infectious organism   [place], Cammie Mcgee, MD   11 months ago Right sided sciatica   [place] Susy Frizzle, MD   1 year ago Iron deficiency anemia,  unspecified iron deficiency anemia type   [place], Cammie Mcgee, MD

## 2022-06-03 ENCOUNTER — Ambulatory Visit (INDEPENDENT_AMBULATORY_CARE_PROVIDER_SITE_OTHER): Payer: Medicare Other | Admitting: Family Medicine

## 2022-06-03 VITALS — BP 118/62 | HR 61 | Temp 97.8°F | Ht 66.0 in | Wt 165.0 lb

## 2022-06-03 DIAGNOSIS — E118 Type 2 diabetes mellitus with unspecified complications: Secondary | ICD-10-CM | POA: Diagnosis not present

## 2022-06-03 DIAGNOSIS — I10 Essential (primary) hypertension: Secondary | ICD-10-CM

## 2022-06-03 DIAGNOSIS — D509 Iron deficiency anemia, unspecified: Secondary | ICD-10-CM

## 2022-06-03 NOTE — Progress Notes (Signed)
Subjective:    Patient ID: Sherri Leon, female    DOB: [birth year], 86 y.o.   MRN: [id number]  Patient is a very sweet 86 year old Caucasian female who presents today for follow-up of her chronic medical problems.  She has a history of type 2 diabetes mellitus currently managed with metformin.  She is due to recheck her hemoglobin A1c today.  She denies any polyuria polydipsia or blurred vision.  She also has a history of iron deficiency anemia.  Her ferritin level was low and more.  However at that time her hemoglobin was 12.  She is been taking iron replacement since that time.  She is due today to recheck her hemoglobin.  She denies chest pain shortness of breath dyspnea on exertion.  In fact this morning she has already picked corn, pick beans, canned beets, cooked bread, all before this appointment!  She is still caring for elderly patients.  She is quite a remarkable lady.  Past Medical History:  Diagnosis Date   Angina at rest [facility])    Arthritis    Colon polyps    Diabetes mellitus without complication ([place])    Dysphagia    GERD (gastroesophageal reflux disease)    Hyperlipidemia    Hyperlipidemia    Hypertension    Iron deficiency anemia    Past Surgical History:  Procedure Laterality Date   [place] N/A 08/23/2015   Procedure: Left Heart Cath;  Surgeon: Leonie Man, MD;  Location: [place] CV LAB;  Service: Cardiovascular;  Laterality: N/A;   COLONOSCOPY WITH PROPOFOL N/A 06/13/2017   Procedure: COLONOSCOPY WITH PROPOFOL;  Surgeon: Manya Silvas, MD;  Location: [facility] ENDOSCOPY;  Service: Endoscopy;  Laterality: N/A;   COLONOSCOPY WITH PROPOFOL N/A 10/16/2021   Procedure: COLONOSCOPY WITH PROPOFOL;  Surgeon: Lesly Rubenstein, MD;  Location: [facility] ENDOSCOPY;  Service: Endoscopy;  Laterality: N/A;   ESOPHAGOGASTRODUODENOSCOPY (EGD) WITH PROPOFOL N/A 06/13/2017   Procedure:  ESOPHAGOGASTRODUODENOSCOPY (EGD) WITH PROPOFOL;  Surgeon: Manya Silvas, MD;  Location: [facility] ENDOSCOPY;  Service: Endoscopy;  Laterality: N/A;   ESOPHAGOGASTRODUODENOSCOPY (EGD) WITH PROPOFOL N/A 10/16/2021   Procedure: ESOPHAGOGASTRODUODENOSCOPY (EGD) WITH PROPOFOL;  Surgeon: Lesly Rubenstein, MD;  Location: [facility] ENDOSCOPY;  Service: Endoscopy;  Laterality: N/A;   HERNIA REPAIR     TONSILLECTOMY  1953   Current Outpatient Medications on File Prior to Visit  Medication Sig Dispense Refill   aspirin EC 81 MG tablet Take 81 mg by mouth every other day.      Blood Glucose Monitoring Suppl (ACCU-CHEK AVIVA PLUS) w/Device KIT PLEASE DISPENSE PER PATIENT'S INSURANCE 1 kit 0   clobetasol cream (TEMOVATE) 0.05 %      ferrous sulfate 325 (65 FE) MG EC tablet Take 1 tablet by mouth every morning.     glucose blood (ACCU-CHEK AVIVA PLUS) test strip Use as instructed to monitor FSBS 1x daily. Dx: E11.9. 100 each 12   hydrochlorothiazide (HYDRODIURIL) 25 MG tablet Take 1 tablet (25 mg total) by mouth daily. 90 tablet 1   levocetirizine (XYZAL) 5 MG tablet TAKE 1 TABLET(5 MG) BY MOUTH EVERY EVENING 90 tablet 0   losartan (COZAAR) 100 MG tablet TAKE 1 TABLET(100 MG) BY MOUTH DAILY 90 tablet 3   metFORMIN (GLUCOPHAGE) 500 MG tablet TAKE 1 TABLET(500 MG) BY MOUTH TWICE DAILY WITH A MEAL 180 tablet 3   Multiple Vitamins-Minerals (MULTIVITAMIN ADULT PO) Take  by mouth.     oxybutynin (DITROPAN) 5 MG tablet Take 1 tablet (5 mg total) by mouth every 8 (eight) hours as needed for bladder spasms. 30 tablet 3   pantoprazole (PROTONIX) 40 MG tablet TAKE 1 TABLET(40 MG) BY MOUTH TWICE DAILY 60 tablet 2   pravastatin (PRAVACHOL) 40 MG tablet TAKE 1 TABLET BY MOUTH EVERY DAY 90 tablet 0   triamcinolone cream (KENALOG) 0.1 % Apply 1 application topically 2 (two) times daily. 30 g 0   No current facility-administered medications on file prior to visit.   No Known Allergies Social History   Socioeconomic History    Marital status: Married    Spouse name: Not on file   Number of children: 1   Years of education: Not on file   Highest education level: Not on file  Occupational History   Not on file  Tobacco Use   Smoking status: Never   Smokeless tobacco: Never  Vaping Use   Vaping Use: Never used  Substance and Sexual Activity   Alcohol use: No   Drug use: No   Sexual activity: Not Currently  Other Topics Concern   Not on file  Social History Narrative   ** Merged History Encounter **       Social Determinants of Health   Financial Resource Strain: Low Risk  (08/17/2021)   Overall Financial Resource Strain (CARDIA)    Difficulty of Paying Living Expenses: Not hard at all  Food Insecurity: No Food Insecurity (08/17/2021)   Hunger Vital Sign    Worried About Running Out of Food in the Last Year: Never true    Ran Out of Food in the Last Year: Never true  Transportation Needs: No Transportation Needs (08/17/2021)   PRAPARE - Hydrologist (Medical): No    Lack of Transportation (Non-Medical): No  Physical Activity: Sufficiently Active (08/17/2021)   Exercise Vital Sign    Days of Exercise per Week: 5 days    Minutes of Exercise per Session: 60 min  Stress: No Stress Concern Present (08/17/2021)   [place]    Feeling of Stress : Not at all  Social Connections: Moderately Integrated (08/17/2021)   Social Connection and Isolation Panel [NHANES]    Frequency of Communication with Friends and Family: More than three times a week    Frequency of Social Gatherings with Friends and Family: More than three times a week    Attends Religious Services: More than 4 times per year    Active Member of [employer] or Organizations: Yes    Attends Archivist Meetings: More than 4 times per year    Marital Status: Widowed  Intimate Partner Violence: Not At Risk (08/17/2021)   Humiliation, Afraid, Rape,  and Kick questionnaire    Fear of Current or Ex-Partner: No    Emotionally Abused: No    Physically Abused: No    Sexually Abused: No      Review of Systems  All other systems reviewed and are negative.      Objective:   Physical Exam Vitals reviewed.  Constitutional:      General: She is not in acute distress.    Appearance: She is well-developed. She is not diaphoretic.  Eyes:     Conjunctiva/sclera: Conjunctivae normal.     Pupils: Pupils are equal, round, and reactive to light.  Neck:     Thyroid: No thyromegaly.     Vascular:  No JVD.  Cardiovascular:     Rate and Rhythm: Normal rate and regular rhythm.     Heart sounds: Normal heart sounds. No murmur heard.    No friction rub. No gallop.  Pulmonary:     Effort: Pulmonary effort is normal. No respiratory distress.     Breath sounds: Normal breath sounds. No stridor. No wheezing or rales.  Lymphadenopathy:     Cervical: No cervical adenopathy.           Assessment & Plan:  Controlled type 2 diabetes mellitus with complication, without long-term current use of insulin ([place]) - Plan: CBC with Differential/Platelet, Lipid panel, COMPLETE METABOLIC PANEL WITH GFR, Hemoglobin A1c  Benign essential HTN - Plan: CBC with Differential/Platelet, Lipid panel, COMPLETE METABOLIC PANEL WITH GFR, Hemoglobin A1c  Iron deficiency anemia, unspecified iron deficiency anemia type - Plan: CBC with Differential/Platelet, Iron Check fasting lab work today including a CBC, CMP, lipid panel and A1c.  Goal LDL cholesterol is less than 100.  Goal A1c is less than 6.5.  As long as her hemoglobin is greater than 12, I plan to discontinue her ferrous sulfate.  Blood pressure today is excellent.

## 2022-06-04 LAB — CBC WITH DIFFERENTIAL/PLATELET
Absolute Monocytes: 358 cells/uL (ref 200–950)
Basophils Absolute: 28 cells/uL (ref 0–200)
Basophils Relative: 0.5 %
Eosinophils Absolute: 132 cells/uL (ref 15–500)
Eosinophils Relative: 2.4 %
HCT: 41.6 % (ref 35.0–45.0)
Hemoglobin: 13.9 g/dL (ref 11.7–15.5)
Lymphs Abs: 2052 cells/uL (ref 850–3900)
MCH: 28.8 pg (ref 27.0–33.0)
MCHC: 33.4 g/dL (ref 32.0–36.0)
MCV: 86.3 fL (ref 80.0–100.0)
MPV: 12 fL (ref 7.5–12.5)
Monocytes Relative: 6.5 %
Neutro Abs: 2932 cells/uL (ref 1500–7800)
Neutrophils Relative %: 53.3 %
Platelets: 203 10*3/uL (ref 140–400)
RBC: 4.82 10*6/uL (ref 3.80–5.10)
RDW: 13.3 % (ref 11.0–15.0)
Total Lymphocyte: 37.3 %
WBC: 5.5 10*3/uL (ref 3.8–10.8)

## 2022-06-04 LAB — COMPLETE METABOLIC PANEL WITH GFR
AG Ratio: 1.8 (calc) (ref 1.0–2.5)
ALT: 12 U/L (ref 6–29)
AST: 18 U/L (ref 10–35)
Albumin: 4.2 g/dL (ref 3.6–5.1)
Alkaline phosphatase (APISO): 82 U/L (ref 37–153)
BUN: 17 mg/dL (ref 7–25)
CO2: 28 mmol/L (ref 20–32)
Calcium: 10.1 mg/dL (ref 8.6–10.4)
Chloride: 107 mmol/L (ref 98–110)
Creat: 0.85 mg/dL (ref 0.60–0.95)
Globulin: 2.4 g/dL (calc) (ref 1.9–3.7)
Glucose, Bld: 194 mg/dL — ABNORMAL HIGH (ref 65–99)
Potassium: 4.5 mmol/L (ref 3.5–5.3)
Sodium: 142 mmol/L (ref 135–146)
Total Bilirubin: 0.5 mg/dL (ref 0.2–1.2)
Total Protein: 6.6 g/dL (ref 6.1–8.1)
eGFR: 68 mL/min/{1.73_m2} (ref >=60)

## 2022-06-04 LAB — LIPID PANEL
Cholesterol: 154 mg/dL (ref <200)
HDL: 51 mg/dL (ref >=50)
LDL Cholesterol (Calc): 78 mg/dL (calc)
Non-HDL Cholesterol (Calc): 103 mg/dL (calc) (ref <130)
Total CHOL/HDL Ratio: 3 (calc) (ref <5.0)
Triglycerides: 150 mg/dL — ABNORMAL HIGH (ref <150)

## 2022-06-04 LAB — HEMOGLOBIN A1C
Hgb A1c MFr Bld: 6.3 % of total Hgb — ABNORMAL HIGH (ref <5.7)
Mean Plasma Glucose: 134 mg/dL
eAG (mmol/L): 7.4 mmol/L

## 2022-06-04 LAB — IRON: Iron: 88 ug/dL (ref 45–160)

## 2022-06-10 DIAGNOSIS — E113393 Type 2 diabetes mellitus with moderate nonproliferative diabetic retinopathy without macular edema, bilateral: Secondary | ICD-10-CM | POA: Diagnosis not present

## 2022-07-31 ENCOUNTER — Other Ambulatory Visit: Payer: Self-pay | Admitting: Family Medicine

## 2022-07-31 DIAGNOSIS — R1319 Other dysphagia: Secondary | ICD-10-CM

## 2022-08-02 ENCOUNTER — Ambulatory Visit (INDEPENDENT_AMBULATORY_CARE_PROVIDER_SITE_OTHER): Payer: Medicare Other | Admitting: Family Medicine

## 2022-08-02 VITALS — BP 120/70 | HR 82 | Temp 98.7°F | Ht 66.0 in | Wt 160.0 lb

## 2022-08-02 DIAGNOSIS — J40 Bronchitis, not specified as acute or chronic: Secondary | ICD-10-CM

## 2022-08-02 MED ORDER — AZITHROMYCIN 250 MG PO TABS: ORAL_TABLET | ORAL | 0 refills | Status: DC

## 2022-08-02 NOTE — Progress Notes (Signed)
Subjective:    Patient ID: GAE BIHL, female    DOB: 12/25/1935, 86 y.o.   MRN: [id number]  Patient is a very sweet 86 year old Caucasian female who presents today with a 2-day history of cough and congestion.  She reports subjective fevers.  She states that her cough is productive of yellow and green sputum.  Initial pulse oximetry was 89% however it improved to 97% once the patient's finger was warmed up.  She has not taken a COVID shot.  She denies any sinus pain or rhinorrhea.  She denies any chest pain, shortness of breath, pleurisy, or hemoptysis.  She denies any nausea or vomiting.  Past Medical History:  Diagnosis Date   Angina at rest [facility])    Arthritis    Colon polyps    Diabetes mellitus without complication ([place])    Dysphagia    GERD (gastroesophageal reflux disease)    Hyperlipidemia    Hyperlipidemia    Hypertension    Iron deficiency anemia    Past Surgical History:  Procedure Laterality Date   [place] N/A 08/23/2015   Procedure: Left Heart Cath;  Surgeon: Leonie Man, MD;  Location: [place] [facility];  Service: Cardiovascular;  Laterality: N/A;   COLONOSCOPY WITH PROPOFOL N/A 06/13/2017   Procedure: COLONOSCOPY WITH PROPOFOL;  Surgeon: Manya Silvas, MD;  Location: [facility] ENDOSCOPY;  Service: Endoscopy;  Laterality: N/A;   COLONOSCOPY WITH PROPOFOL N/A 10/16/2021   Procedure: COLONOSCOPY WITH PROPOFOL;  Surgeon: Lesly Rubenstein, MD;  Location: [facility] ENDOSCOPY;  Service: Endoscopy;  Laterality: N/A;   ESOPHAGOGASTRODUODENOSCOPY (EGD) WITH PROPOFOL N/A 06/13/2017   Procedure: ESOPHAGOGASTRODUODENOSCOPY (EGD) WITH PROPOFOL;  Surgeon: Manya Silvas, MD;  Location: [facility] ENDOSCOPY;  Service: Endoscopy;  Laterality: N/A;   ESOPHAGOGASTRODUODENOSCOPY (EGD) WITH PROPOFOL N/A 10/16/2021   Procedure: ESOPHAGOGASTRODUODENOSCOPY (EGD) WITH PROPOFOL;  Surgeon:  Lesly Rubenstein, MD;  Location: [facility] ENDOSCOPY;  Service: Endoscopy;  Laterality: N/A;   HERNIA REPAIR     TONSILLECTOMY  1955   Current Outpatient Medications on File Prior to Visit  Medication Sig Dispense Refill   Blood Glucose Monitoring Suppl (ACCU-CHEK AVIVA PLUS) w/Device KIT PLEASE DISPENSE PER PATIENT'S INSURANCE 1 kit 0   glucose blood (ACCU-CHEK AVIVA PLUS) test strip Use as instructed to monitor FSBS 1x daily. Dx: E11.9. 100 each 12   levocetirizine (XYZAL) 5 MG tablet TAKE 1 TABLET(5 MG) BY MOUTH EVERY EVENING 90 tablet 0   losartan (COZAAR) 100 MG tablet TAKE 1 TABLET(100 MG) BY MOUTH DAILY 90 tablet 3   metFORMIN (GLUCOPHAGE) 500 MG tablet TAKE 1 TABLET(500 MG) BY MOUTH TWICE DAILY WITH A MEAL 180 tablet 3   Multiple Vitamins-Minerals (MULTIVITAMIN ADULT PO) Take by mouth.     pantoprazole (PROTONIX) 40 MG tablet TAKE 1 TABLET(40 MG) BY MOUTH TWICE DAILY 60 tablet 2   pravastatin (PRAVACHOL) 40 MG tablet TAKE 1 TABLET BY MOUTH EVERY DAY 90 tablet 0   aspirin EC 81 MG tablet Take 81 mg by mouth every other day.  (Patient not taking: Reported on 08/02/2022)     clobetasol cream (TEMOVATE) 0.05 %  (Patient not taking: Reported on 08/02/2022)     ferrous sulfate 325 (65 FE) MG EC tablet Take 1 tablet by mouth every morning. (Patient not taking: Reported on 08/02/2022)     hydrochlorothiazide (HYDRODIURIL) 25 MG tablet Take 1 tablet (25 mg total) by mouth  daily. (Patient not taking: Reported on 08/02/2022) 90 tablet 1   oxybutynin (DITROPAN) 5 MG tablet Take 1 tablet (5 mg total) by mouth every 8 (eight) hours as needed for bladder spasms. (Patient not taking: Reported on 08/02/2022) 30 tablet 3   triamcinolone cream (KENALOG) 0.1 % Apply 1 application topically 2 (two) times daily. (Patient not taking: Reported on 08/02/2022) 30 g 0   No current facility-administered medications on file prior to visit.   No Known Allergies Social History   Socioeconomic History   Marital status:  Married    Spouse name: Not on file   Number of children: 1   Years of education: Not on file   Highest education level: Not on file  Occupational History   Not on file  Tobacco Use   Smoking status: Never   Smokeless tobacco: Never  Vaping Use   Vaping Use: Never used  Substance and Sexual Activity   Alcohol use: No   Drug use: No   Sexual activity: Not Currently  Other Topics Concern   Not on file  Social History Narrative   ** Merged History Encounter **       Social Determinants of Health   Financial Resource Strain: Low Risk  (08/17/2021)   Overall Financial Resource Strain (CARDIA)    Difficulty of Paying Living Expenses: Not hard at all  Food Insecurity: No Food Insecurity (08/17/2021)   Hunger Vital Sign    Worried About Running Out of Food in the Last Year: Never true    Ran Out of Food in the Last Year: Never true  Transportation Needs: No Transportation Needs (08/17/2021)   PRAPARE - Hydrologist (Medical): No    Lack of Transportation (Non-Medical): No  Physical Activity: Sufficiently Active (08/17/2021)   Exercise Vital Sign    Days of Exercise per Week: 5 days    Minutes of Exercise per Session: 60 min  Stress: No Stress Concern Present (08/17/2021)   [place]    Feeling of Stress : Not at all  Social Connections: Moderately Integrated (08/17/2021)   Social Connection and Isolation Panel [NHANES]    Frequency of Communication with Friends and Family: More than three times a week    Frequency of Social Gatherings with Friends and Family: More than three times a week    Attends Religious Services: More than 4 times per year    Active Member of [employer] or Organizations: Yes    Attends Archivist Meetings: More than 4 times per year    Marital Status: Widowed  Intimate Partner Violence: Not At Risk (08/17/2021)   Humiliation, Afraid, Rape, and Kick  questionnaire    Fear of Current or Ex-Partner: No    Emotionally Abused: No    Physically Abused: No    Sexually Abused: No      Review of Systems  All other systems reviewed and are negative.      Objective:   Physical Exam Vitals reviewed.  Constitutional:      General: She is not in acute distress.    Appearance: Normal appearance. She is well-developed. She is not ill-appearing or diaphoretic.  HENT:     Right Ear: Tympanic membrane and ear canal normal.     Left Ear: Tympanic membrane and ear canal normal.     Nose: Nose normal.     Mouth/Throat:     Pharynx: Oropharynx is clear. No oropharyngeal exudate  or posterior oropharyngeal erythema.  Eyes:     Conjunctiva/sclera: Conjunctivae normal.     Pupils: Pupils are equal, round, and reactive to light.  Neck:     Thyroid: No thyromegaly.     Vascular: No JVD.  Cardiovascular:     Rate and Rhythm: Normal rate and regular rhythm.     Heart sounds: Normal heart sounds. No murmur heard.    No friction rub. No gallop.  Pulmonary:     Effort: Pulmonary effort is normal. No respiratory distress.     Breath sounds: Normal breath sounds. No stridor. No wheezing or rales.  Lymphadenopathy:     Cervical: No cervical adenopathy.           Assessment & Plan:  Bronchitis I suspect a bronchitis however I do not have any COVID test available in the office today.  Given the recent outbreak I would like the patient to go home and perform a home COVID test.  If positive, I would definitely recommend Paxlovid.  If negative, I would recommend a Z-Pak for possible bronchitis.

## 2022-08-05 ENCOUNTER — Ambulatory Visit: Payer: Self-pay | Admitting: *Deleted

## 2022-08-05 NOTE — Telephone Encounter (Signed)
  Chief Complaint: slow heart rate in 50's and feeling weak.  She tested positive for Covid but she doesn't believe it's accurate because the  ZPack Dr. Dennard Schaumann prescribed for her has her feeling better.   If it was Covid the Z Pack would not have made me feel better, per pt. Symptoms: Weak, slow heart rate otherwise feels fine. Frequency: Started yesterday that she noticed her heart rate was in 50's. Pertinent Negatives: Patient denies chest pain, dizziness, or shortness of breath.    "I'm just real tired". Disposition: '[]'$ ED /'[]'$ Urgent Care (no appt availability in office) / '[x]'$ Appointment(In office/virtual)/ '[]'$  [facility]/ '[]'$ Home Care/ '[]'$ Refused Recommended Disposition /'[]'$ [place] [facility]/ '[]'$  Follow-up with PCP Additional Notes: Appt made for 08/08/2022 at 9:00 with Dr Dennard Schaumann.   Message sent to see if she can be worked in sooner due to heart rate and weakness.

## 2022-08-05 NOTE — Telephone Encounter (Signed)
Message from Sherri Leon, [place] sent at 08/05/2022  2:20 PM EDT  Summary: positive covid   Pt called in stating that she tested positive for Covid, but she is concerned that her heart rate has been running in the low 50's. Pt states that she has not much of an appetite and is really fatigued. Pt doesn't believe that she has Covid because the antibiotic she was prescribed has made her feel much better.   Cb#: [phone]           Call History   Type Contact Phone/Fax User  08/05/2022 02:16 PM EDT Phone (Incoming) Tomi, [place] (Self) [phone] Barron [place], [place]   Reason for Disposition  Age > 60 years  (Exception: Brief heartbeat symptoms that went away and now feels well.)  Answer Assessment - Initial Assessment Questions 1. DESCRIPTION: "Please describe your heart rate or heartbeat that you are having" (e.g., fast/slow, regular/irregular, skipped or extra beats, "palpitations")     My heart rate is usually in the 60s.    Yesterday 52 pulse today it's 51.  No shortness of breath.   No chest pain.    I tested positive for Covid.   I've taken the Z Pac and I'm feeling better each day.   I'm just weak.   I'm eating now but just weak.    No history of low heart beat.   No medication changes.     2. ONSET: "When did it start?" (Minutes, hours or days)      Yesterday you noticed.   BP has been 128/69.    3. DURATION: "How long does it last" (e.g., seconds, minutes, hours)     Yesterday and today my heart rate has been low.   4. PATTERN "Does it come and go, or has it been constant since it started?"  "Does it get worse with exertion?"   "Are you feeling it now?"      5. TAP: "Using your hand, can you tap out what you are feeling on a chair or table in front of you, so that I can hear?" (Note: not all patients can do this)       No 6. HEART RATE: "Can you tell me your heart rate?" "How many beats in 15 seconds?"  (Note: not all patients can do this)       51 today. 7. RECURRENT  SYMPTOM: "Have you ever had this before?" If Yes, ask: "When was the last time?" and "What happened that time?"      No 8. CAUSE: "What do you think is causing the palpitations?"     I don't know unless it's the Z Pack.   That's the only change. 9. CARDIAC HISTORY: "Do you have any history of heart disease?" (e.g., heart attack, angina, bypass surgery, angioplasty, arrhythmia)      No 10. OTHER SYMPTOMS: "Do you have any other symptoms?" (e.g., dizziness, chest pain, sweating, difficulty breathing)       Weak 11. PREGNANCY: "Is there any chance you are pregnant?" "When was your last menstrual period?"       N/A  Protocols used: Heart Rate and Heartbeat Questions-A-AH

## 2022-08-08 ENCOUNTER — Ambulatory Visit: Payer: Medicare Other | Admitting: Family Medicine

## 2022-08-17 ENCOUNTER — Other Ambulatory Visit: Payer: Self-pay | Admitting: Family Medicine

## 2022-09-18 ENCOUNTER — Ambulatory Visit (INDEPENDENT_AMBULATORY_CARE_PROVIDER_SITE_OTHER): Payer: Medicare Other | Admitting: Family Medicine

## 2022-09-18 VITALS — BP 132/78 | HR 65 | Ht 66.0 in | Wt 165.0 lb

## 2022-09-18 DIAGNOSIS — R109 Unspecified abdominal pain: Secondary | ICD-10-CM | POA: Diagnosis not present

## 2022-09-18 DIAGNOSIS — N3281 Overactive bladder: Secondary | ICD-10-CM | POA: Diagnosis not present

## 2022-09-18 LAB — URINALYSIS, ROUTINE W REFLEX MICROSCOPIC
Bilirubin Urine: NEGATIVE
Glucose, UA: NEGATIVE
Hgb urine dipstick: NEGATIVE
Leukocytes,Ua: NEGATIVE
Nitrite: NEGATIVE
Protein, ur: NEGATIVE
Specific Gravity, Urine: 1.02 (ref 1.001–1.035)
pH: 5.5 (ref 5.0–8.0)

## 2022-09-18 NOTE — Progress Notes (Signed)
Subjective:    Patient ID: Sherri Leon, female    DOB: May 16, 1936, 86 y.o.   MRN: [id number]  Patient is here today reporting urinary frequency and urgency.  She states that she has to urinate frequently throughout the night and keeps her from resting.  Please see my office visit from January of this year.  I diagnosed the patient with overactive bladder and gave her a prescription for oxybutynin.  She states that the medicine helped at the time but she has not needed it since.  This started about a month ago.  Urinalysis shows no nitrates, no leukocyte esterase, no blood.  She denies any fever or chills or nausea or abdominal pain.  She has no CVA tenderness Past Medical History:  Diagnosis Date   Angina at rest [facility])    Arthritis    Colon polyps    Diabetes mellitus without complication ([place])    Dysphagia    GERD (gastroesophageal reflux disease)    Hyperlipidemia    Hyperlipidemia    Hypertension    Iron deficiency anemia    Past Surgical History:  Procedure Laterality Date   [place] N/A 08/23/2015   Procedure: Left Heart Cath;  Surgeon: Leonie Man, MD;  Location: [facility];  Service: Cardiovascular;  Laterality: N/A;   COLONOSCOPY WITH PROPOFOL N/A 06/13/2017   Procedure: COLONOSCOPY WITH PROPOFOL;  Surgeon: Manya Silvas, MD;  Location: [facility] ENDOSCOPY;  Service: Endoscopy;  Laterality: N/A;   COLONOSCOPY WITH PROPOFOL N/A 10/16/2021   Procedure: COLONOSCOPY WITH PROPOFOL;  Surgeon: Lesly Rubenstein, MD;  Location: [facility] ENDOSCOPY;  Service: Endoscopy;  Laterality: N/A;   ESOPHAGOGASTRODUODENOSCOPY (EGD) WITH PROPOFOL N/A 06/13/2017   Procedure: ESOPHAGOGASTRODUODENOSCOPY (EGD) WITH PROPOFOL;  Surgeon: Manya Silvas, MD;  Location: [facility] ENDOSCOPY;  Service: Endoscopy;  Laterality: N/A;   ESOPHAGOGASTRODUODENOSCOPY (EGD) WITH PROPOFOL N/A 10/16/2021    Procedure: ESOPHAGOGASTRODUODENOSCOPY (EGD) WITH PROPOFOL;  Surgeon: Lesly Rubenstein, MD;  Location: [facility] ENDOSCOPY;  Service: Endoscopy;  Laterality: N/A;   HERNIA REPAIR     TONSILLECTOMY  1955   Current Outpatient Medications on File Prior to Visit  Medication Sig Dispense Refill   aspirin EC 81 MG tablet Take 81 mg by mouth every other day.  (Patient not taking: Reported on 08/02/2022)     azithromycin (ZITHROMAX) 250 MG tablet 2 tabs poqday1, 1 tab poqday 2-5 6 tablet 0   Blood Glucose Monitoring Suppl (ACCU-CHEK AVIVA PLUS) w/Device KIT PLEASE DISPENSE PER PATIENT'S INSURANCE 1 kit 0   clobetasol cream (TEMOVATE) 0.05 %  (Patient not taking: Reported on 08/02/2022)     ferrous sulfate 325 (65 FE) MG EC tablet Take 1 tablet by mouth every morning. (Patient not taking: Reported on 08/02/2022)     glucose blood (ACCU-CHEK AVIVA PLUS) test strip Use as instructed to monitor FSBS 1x daily. Dx: E11.9. 100 each 12   hydrochlorothiazide (HYDRODIURIL) 25 MG tablet Take 1 tablet (25 mg total) by mouth daily. (Patient not taking: Reported on 08/02/2022) 90 tablet 1   levocetirizine (XYZAL) 5 MG tablet TAKE 1 TABLET(5 MG) BY MOUTH EVERY EVENING 90 tablet 0   losartan (COZAAR) 100 MG tablet TAKE 1 TABLET(100 MG) BY MOUTH DAILY 90 tablet 3   metFORMIN (GLUCOPHAGE) 500 MG tablet TAKE 1 TABLET(500 MG) BY MOUTH TWICE DAILY WITH A MEAL 180 tablet 3   Multiple Vitamins-Minerals (MULTIVITAMIN ADULT PO)  Take by mouth.     oxybutynin (DITROPAN) 5 MG tablet Take 1 tablet (5 mg total) by mouth every 8 (eight) hours as needed for bladder spasms. (Patient not taking: Reported on 08/02/2022) 30 tablet 3   pantoprazole (PROTONIX) 40 MG tablet TAKE 1 TABLET(40 MG) BY MOUTH TWICE DAILY 60 tablet 2   pravastatin (PRAVACHOL) 40 MG tablet TAKE 1 TABLET BY MOUTH EVERY DAY 90 tablet 0   triamcinolone cream (KENALOG) 0.1 % Apply 1 application topically 2 (two) times daily. (Patient not taking: Reported on 08/02/2022) 30 g 0    No current facility-administered medications on file prior to visit.   No Known Allergies Social History   Socioeconomic History   Marital status: Married    Spouse name: Not on file   Number of children: 1   Years of education: Not on file   Highest education level: Not on file  Occupational History   Not on file  Tobacco Use   Smoking status: Never   Smokeless tobacco: Never  Vaping Use   Vaping Use: Never used  Substance and Sexual Activity   Alcohol use: No   Drug use: No   Sexual activity: Not Currently  Other Topics Concern   Not on file  Social History Narrative   ** Merged History Encounter **       Social Determinants of Health   Financial Resource Strain: Low Risk  (08/17/2021)   Overall Financial Resource Strain (CARDIA)    Difficulty of Paying Living Expenses: Not hard at all  Food Insecurity: No Food Insecurity (08/17/2021)   Hunger Vital Sign    Worried About Running Out of Food in the Last Year: Never true    Ran Out of Food in the Last Year: Never true  Transportation Needs: No Transportation Needs (08/17/2021)   PRAPARE - Hydrologist (Medical): No    Lack of Transportation (Non-Medical): No  Physical Activity: Sufficiently Active (08/17/2021)   Exercise Vital Sign    Days of Exercise per Week: 5 days    Minutes of Exercise per Session: 60 min  Stress: No Stress Concern Present (08/17/2021)   [place]    Feeling of Stress : Not at all  Social Connections: Moderately Integrated (08/17/2021)   Social Connection and Isolation Panel [NHANES]    Frequency of Communication with Friends and Family: More than three times a week    Frequency of Social Gatherings with Friends and Family: More than three times a week    Attends Religious Services: More than 4 times per year    Active Member of [employer] or Organizations: Yes    Attends Archivist Meetings:  More than 4 times per year    Marital Status: Widowed  Intimate Partner Violence: Not At Risk (08/17/2021)   Humiliation, Afraid, Rape, and Kick questionnaire    Fear of Current or Ex-Partner: No    Emotionally Abused: No    Physically Abused: No    Sexually Abused: No      Review of Systems  All other systems reviewed and are negative.      Objective:   Physical Exam Vitals reviewed.  Constitutional:      General: She is not in acute distress.    Appearance: Normal appearance. She is well-developed and normal weight. She is not ill-appearing or diaphoretic.  HENT:     Mouth/Throat:     Mouth: Mucous membranes are moist.  Pharynx: No oropharyngeal exudate or posterior oropharyngeal erythema.  Neck:     Thyroid: No thyromegaly.     Vascular: No JVD.  Cardiovascular:     Rate and Rhythm: Normal rate and regular rhythm.     Heart sounds: Normal heart sounds. No murmur heard.    No friction rub. No gallop.  Pulmonary:     Effort: Pulmonary effort is normal. No respiratory distress.     Breath sounds: Normal breath sounds. No stridor. No wheezing or rales.  Abdominal:     General: Bowel sounds are normal. There is no distension.     Palpations: Abdomen is soft.     Tenderness: There is no abdominal tenderness. There is no right CVA tenderness, left CVA tenderness or guarding.  Neurological:     Mental Status: She is alert.           Assessment & Plan:  Flank pain - Plan: Urinalysis, Routine w reflex microscopic  OAB (overactive bladder) I see no evidence of urinary tract infection.  I believe the patient is dealing with overactive bladder.  I recommended that she try the oxybutynin that she has at home 5 mg p.o. nightly to see if this increases the urinary frequency at night.  If it does we can increase the frequency to twice daily to help the symptoms during the daytime.  I did caution the patient about potential side effects.

## 2022-10-16 ENCOUNTER — Telehealth: Payer: Self-pay | Admitting: Family Medicine

## 2022-10-16 NOTE — Telephone Encounter (Signed)
Left message for patient to call back and schedule Medicare Annual Wellness Visit (AWV) in office.   If not able to come in office, please offer to do virtually or by telephone.   Last AWV:08/17/2021   Please schedule at anytime with [facility]  If any questions, please contact me at [phone].  Thank you ,  Colletta [place]

## 2022-10-22 ENCOUNTER — Other Ambulatory Visit: Payer: Self-pay | Admitting: Family Medicine

## 2022-10-31 ENCOUNTER — Other Ambulatory Visit: Payer: Self-pay | Admitting: Family Medicine

## 2022-10-31 DIAGNOSIS — R1319 Other dysphagia: Secondary | ICD-10-CM

## 2022-10-31 NOTE — Telephone Encounter (Signed)
Requested Prescriptions  Pending Prescriptions Disp Refills   pantoprazole (PROTONIX) 40 MG tablet [Pharmacy Med Name: PANTOPRAZOLE '40MG'$  TABLETS] 60 tablet 2    Sig: TAKE 1 TABLET(40 MG) BY MOUTH TWICE DAILY     Gastroenterology: Proton Pump Inhibitors Passed - 10/31/2022  6:21 AM      Passed - Valid encounter within last 12 months    Recent Outpatient Visits           8 months ago Anemia, unspecified type   [place] [facility] Schaumann, Cammie Mcgee, MD   11 months ago OAB (overactive bladder)   [place] Susy Frizzle, MD   1 year ago Pneumonia of right lower lobe due to infectious organism   [place] Pickard, Cammie Mcgee, MD   1 year ago Right sided sciatica   [place] Susy Frizzle, MD   1 year ago Iron deficiency anemia, unspecified iron deficiency anemia type   [place] Pickard, Cammie Mcgee, MD               pravastatin (PRAVACHOL) 40 MG tablet [Pharmacy Med Name: PRAVASTATIN '40MG'$  TABLETS] 90 tablet 0    Sig: TAKE 1 TABLET BY MOUTH EVERY DAY     Cardiovascular:  Antilipid - Statins Failed - 10/31/2022  6:21 AM      Failed - Lipid Panel in normal range within the last 12 months    Cholesterol  Date Value Ref Range Status  06/03/2022 154 <200 mg/dL Final   LDL Cholesterol (Calc)  Date Value Ref Range Status  06/03/2022 78 mg/dL (calc) Final    Comment:    Reference range: <100 . Desirable range <100 mg/dL for primary prevention;   <70 mg/dL for patients with CHD or diabetic patients  with > or = 2 CHD risk factors. Marland Kitchen LDL-C is now calculated using the Martin-Hopkins  calculation, which is a validated novel method providing  better accuracy than the Friedewald equation in the  estimation of LDL-C.  Cresenciano Genre et al. Annamaria Helling. 2482;500(37): 2061-2068  (http://education.[employer].com/faq/FAQ164)    HDL  Date Value Ref Range Status  06/03/2022 51 > OR = 50 mg/dL Final    Triglycerides  Date Value Ref Range Status  06/03/2022 150 (H) <150 mg/dL Final         Passed - Patient is not pregnant      Passed - Valid encounter within last 12 months    Recent Outpatient Visits           8 months ago Anemia, unspecified type   [place] Pickard, Cammie Mcgee, MD   11 months ago OAB (overactive bladder)   [place] Susy Frizzle, MD   1 year ago Pneumonia of right lower lobe due to infectious organism   [place] Pickard, Cammie Mcgee, MD   1 year ago Right sided sciatica   [place] Susy Frizzle, MD   1 year ago Iron deficiency anemia, unspecified iron deficiency anemia type   [place] Pickard, Cammie Mcgee, MD

## 2022-10-31 NOTE — Telephone Encounter (Signed)
Requested by interface [email or web address]. Duplicate request . Receipt confirmed by pharmacy 10/31/22 at 10:36 am.  Requested Prescriptions  Refused Prescriptions Disp Refills   pantoprazole (PROTONIX) 40 MG tablet [Pharmacy Med Name: PANTOPRAZOLE '40MG'$  TABLETS] 60 tablet 2    Sig: TAKE 1 TABLET(40 MG) BY MOUTH TWICE DAILY     Gastroenterology: Proton Pump Inhibitors Passed - 10/31/2022 11:26 AM      Passed - Valid encounter within last 12 months    Recent Outpatient Visits           8 months ago Anemia, unspecified type   [place] Susy Frizzle, MD   11 months ago OAB (overactive bladder)   [place] Susy Frizzle, MD   1 year ago Pneumonia of right lower lobe due to infectious organism   [place] Pickard, Cammie Mcgee, MD   1 year ago Right sided sciatica   [place] Susy Frizzle, MD   1 year ago Iron deficiency anemia, unspecified iron deficiency anemia type   [place] Pickard, Cammie Mcgee, MD

## 2022-11-12 ENCOUNTER — Ambulatory Visit (INDEPENDENT_AMBULATORY_CARE_PROVIDER_SITE_OTHER): Payer: Medicare Other

## 2022-11-12 VITALS — BP 126/72 | Ht 66.0 in | Wt 171.0 lb

## 2022-11-12 DIAGNOSIS — Z0000 Encounter for general adult medical examination without abnormal findings: Secondary | ICD-10-CM | POA: Diagnosis not present

## 2022-11-12 MED ORDER — LEVOCETIRIZINE DIHYDROCHLORIDE 5 MG PO TABS: ORAL_TABLET | ORAL | 0 refills | Status: DC

## 2022-11-12 NOTE — Patient Instructions (Addendum)
Sherri Leon , Thank you for taking time to come for your Medicare Wellness Visit. I appreciate your ongoing commitment to your health goals. Please review the following plan we discussed and let me know if I can assist you in the future.   These are the goals we discussed:  Goals      DIET - INCREASE WATER INTAKE     Make sure to drink 3 bottles of water per day.        This is a list of the screening recommended for you and due dates:  Health Maintenance  Topic Date Due   COVID-19 Vaccine (1) Never done   Yearly kidney health urinalysis for diabetes  Never done   DTaP/Tdap/Td vaccine (1 - Tdap) Never done   Zoster (Shingles) Vaccine (1 of 2) Never done   Complete foot exam   03/23/2021   Eye exam for diabetics  11/27/2021   Hemoglobin A1C  12/04/2022   Yearly kidney function blood test for diabetes  06/04/2023   Medicare Annual Wellness Visit  11/13/2023   Pneumonia Vaccine  Completed   Flu Shot  Completed   DEXA scan (bone density measurement)  Completed   HPV Vaccine  Aged Out    Advanced directives: Please bring a copy of your health care power of attorney and living will to the office to be added to your chart at your convenience.   Conditions/risks identified: Aim for 30 minutes of exercise or brisk walking, 6-8 glasses of water, and 5 servings of fruits and vegetables each day.   Next appointment: Follow up in one year for your annual wellness visit    Preventive Care 65 Years and Older, Female Preventive care refers to lifestyle choices and visits with your health care provider that can promote health and wellness. What does preventive care include? A yearly physical exam. This is also called an annual well check. Dental exams once or twice a year. Routine eye exams. Ask your health care provider how often you should have your eyes checked. Personal lifestyle choices, including: Daily care of your teeth and gums. Regular physical activity. Eating a healthy  diet. Avoiding tobacco and drug use. Limiting alcohol use. Practicing safe sex. Taking low-dose aspirin every day. Taking vitamin and mineral supplements as recommended by your health care provider. What happens during an annual well check? The services and screenings done by your health care provider during your annual well check will depend on your age, overall health, lifestyle risk factors, and family history of disease. Counseling  Your health care provider may ask you questions about your: Alcohol use. Tobacco use. Drug use. Emotional well-being. Home and relationship well-being. Sexual activity. Eating habits. History of falls. Memory and ability to understand (cognition). Work and work Statistician. Reproductive health. Screening  You may have the following tests or measurements: Height, weight, and BMI. Blood pressure. Lipid and cholesterol levels. These may be checked every 5 years, or more frequently if you are over 28 years old. Skin check. Lung cancer screening. You may have this screening every year starting at age 85 if you have a 30-pack-year history of smoking and currently smoke or have quit within the past 15 years. Fecal occult blood test (FOBT) of the stool. You may have this test every year starting at age 2. Flexible sigmoidoscopy or colonoscopy. You may have a sigmoidoscopy every 5 years or a colonoscopy every 10 years starting at age 7. Hepatitis C blood test. Hepatitis B blood test. Sexually transmitted  disease (STD) testing. Diabetes screening. This is done by checking your blood sugar (glucose) after you have not eaten for a while (fasting). You may have this done every 1-3 years. Bone density scan. This is done to screen for osteoporosis. You may have this done starting at age 45. Mammogram. This may be done every 1-2 years. Talk to your health care provider about how often you should have regular mammograms. Talk with your health care provider about  your test results, treatment options, and if necessary, the need for more tests. Vaccines  Your health care provider may recommend certain vaccines, such as: Influenza vaccine. This is recommended every year. Tetanus, diphtheria, and acellular pertussis (Tdap, Td) vaccine. You may need a Td booster every 10 years. Zoster vaccine. You may need this after age 37. Pneumococcal 13-valent conjugate (PCV13) vaccine. One dose is recommended after age 40. Pneumococcal polysaccharide (PPSV23) vaccine. One dose is recommended after age 29. Talk to your health care provider about which screenings and vaccines you need and how often you need them. This information is not intended to replace advice given to you by your health care provider. Make sure you discuss any questions you have with your health care provider. Document Released: 12/01/2015 Document Revised: 07/24/2016 Document Reviewed: 09/05/2015 Elsevier Interactive Patient Education  2017 [place] Prevention in the Home Falls can cause injuries. They can happen to people of all ages. There are many things you can do to make your home safe and to help prevent falls. What can I do on the outside of my home? Regularly fix the edges of walkways and driveways and fix any cracks. Remove anything that might make you trip as you walk through a door, such as a raised step or threshold. Trim any bushes or trees on the path to your home. Use bright outdoor lighting. Clear any walking paths of anything that might make someone trip, such as rocks or tools. Regularly check to see if handrails are loose or broken. Make sure that both sides of any steps have handrails. Any raised decks and porches should have guardrails on the edges. Have any leaves, snow, or ice cleared regularly. Use sand or salt on walking paths during winter. Clean up any spills in your garage right away. This includes oil or grease spills. What can I do in the bathroom? Use  night lights. Install grab bars by the toilet and in the tub and shower. Do not use towel bars as grab bars. Use non-skid mats or decals in the tub or shower. If you need to sit down in the shower, use a plastic, non-slip stool. Keep the floor dry. Clean up any water that spills on the floor as soon as it happens. Remove soap buildup in the tub or shower regularly. Attach bath mats securely with double-sided non-slip rug tape. Do not have throw rugs and other things on the floor that can make you trip. What can I do in the bedroom? Use night lights. Make sure that you have a light by your bed that is easy to reach. Do not use any sheets or blankets that are too big for your bed. They should not hang down onto the floor. Have a firm chair that has side arms. You can use this for support while you get dressed. Do not have throw rugs and other things on the floor that can make you trip. What can I do in the kitchen? Clean up any spills right away. Avoid walking  on wet floors. Keep items that you use a lot in easy-to-reach places. If you need to reach something above you, use a strong step stool that has a grab bar. Keep electrical cords out of the way. Do not use floor polish or wax that makes floors slippery. If you must use wax, use non-skid floor wax. Do not have throw rugs and other things on the floor that can make you trip. What can I do with my stairs? Do not leave any items on the stairs. Make sure that there are handrails on both sides of the stairs and use them. Fix handrails that are broken or loose. Make sure that handrails are as long as the stairways. Check any carpeting to make sure that it is firmly attached to the stairs. Fix any carpet that is loose or worn. Avoid having throw rugs at the top or bottom of the stairs. If you do have throw rugs, attach them to the floor with carpet tape. Make sure that you have a light switch at the top of the stairs and the bottom of the  stairs. If you do not have them, ask someone to add them for you. What else can I do to help prevent falls? Wear shoes that: Do not have high heels. Have rubber bottoms. Are comfortable and fit you well. Are closed at the toe. Do not wear sandals. If you use a stepladder: Make sure that it is fully opened. Do not climb a closed stepladder. Make sure that both sides of the stepladder are locked into place. Ask someone to hold it for you, if possible. Clearly mark and make sure that you can see: Any grab bars or handrails. First and last steps. Where the edge of each step is. Use tools that help you move around (mobility aids) if they are needed. These include: Canes. Walkers. Scooters. Crutches. Turn on the lights when you go into a dark area. Replace any light bulbs as soon as they burn out. Set up your furniture so you have a clear path. Avoid moving your furniture around. If any of your floors are uneven, fix them. If there are any pets around you, be aware of where they are. Review your medicines with your doctor. Some medicines can make you feel dizzy. This can increase your chance of falling. Ask your doctor what other things that you can do to help prevent falls. This information is not intended to replace advice given to you by your health care provider. Make sure you discuss any questions you have with your health care provider. Document Released: 08/31/2009 Document Revised: 04/11/2016 Document Reviewed: 12/09/2014 Elsevier Interactive Patient Education  2017 [employer].

## 2022-11-12 NOTE — Progress Notes (Signed)
Subjective:   Sherri Leon is a 86 y.o. female who presents for Medicare Annual (Subsequent) preventive examination.  Review of Systems     Cardiac Risk Factors include: advanced age (>80mn, >>59women);diabetes mellitus;dyslipidemia;hypertension     Objective:    Today's Vitals   11/12/22 1120  BP: 126/72  Weight: 171 lb (77.6 kg)  Height: _0  (1.676 m)   Body mass index is 27.6 kg/m.     11/12/2022   11:21 AM 10/16/2021   12:40 PM 08/17/2021    2:57 PM 08/23/2015    6:53 AM 04/22/2015   11:09 AM  Advanced Directives  Does Patient Have a Medical Advance Directive? Yes No No No No  Type of Advance Directive Living will;Healthcare Power of Attorney      Does patient want to make changes to medical advance directive? No - Patient declined      Copy of [place] Chart? No - copy requested      Would patient like information on creating a medical advance directive?   No - Patient declined No - patient declined information     Current Medications (verified) Outpatient Encounter Medications as of 11/12/2022  Medication Sig   Blood Glucose Monitoring Suppl (ACCU-CHEK AVIVA PLUS) w/Device KIT PLEASE DISPENSE PER PATIENT'S INSURANCE   glucose blood (ACCU-CHEK GUIDE) test strip USE AS DIRECTED TO MONITOR FASTING BLOOD SUGAR ONCE DAILY   losartan (COZAAR) 100 MG tablet TAKE 1 TABLET(100 MG) BY MOUTH DAILY   metFORMIN (GLUCOPHAGE) 500 MG tablet TAKE 1 TABLET(500 MG) BY MOUTH TWICE DAILY WITH A MEAL   Multiple Vitamins-Minerals (MULTIVITAMIN ADULT PO) Take by mouth.   pantoprazole (PROTONIX) 40 MG tablet TAKE 1 TABLET(40 MG) BY MOUTH TWICE DAILY   pravastatin (PRAVACHOL) 40 MG tablet TAKE 1 TABLET BY MOUTH EVERY DAY   [DISCONTINUED] levocetirizine (XYZAL) 5 MG tablet TAKE 1 TABLET(5 MG) BY MOUTH EVERY EVENING   levocetirizine (XYZAL) 5 MG tablet TAKE 1 TABLET(5 MG) BY MOUTH EVERY EVENING   [DISCONTINUED] azithromycin (ZITHROMAX) 250 MG tablet 2 tabs poqday1,  1 tab poqday 2-5   No facility-administered encounter medications on file as of 11/12/2022.    Allergies (verified) Patient has no known allergies.   History: Past Medical History:  Diagnosis Date   Angina at rest    Arthritis    Colon polyps    Diabetes mellitus [place] complication ([place]    Dysphagia    GERD (gastroesophageal reflux disease)    Hyperlipidemia    Hyperlipidemia    Hypertension    Iron deficiency anemia    Past Surgical History:  Procedure Laterality Date   [place] 08/23/2015   Procedure: Left Heart Cath;  Surgeon: DLeonie Man MD;  [facility];  Service: Cardiovascular;  Laterality: N/A;   COLONOSCOPY WITH PROPOFOL N/A 06/13/2017   Procedure: COLONOSCOPY WITH PROPOFOL;  Surgeon: EManya Silvas MD;  [facility];  Service: Endoscopy;  Laterality: N/A;   COLONOSCOPY WITH PROPOFOL N/A 10/16/2021   Procedure: COLONOSCOPY WITH PROPOFOL;  Surgeon: LLesly Rubenstein MD;  Location: [facility] ENDOSCOPY;  Service: Endoscopy;  Laterality: N/A;   ESOPHAGOGASTRODUODENOSCOPY (EGD) WITH PROPOFOL N/A 06/13/2017   Procedure: ESOPHAGOGASTRODUODENOSCOPY (EGD) WITH PROPOFOL;  Surgeon: EManya Silvas MD;  [facility];  Service: Endoscopy;  Laterality: N/A;   ESOPHAGOGASTRODUODENOSCOPY (EGD) WITH PROPOFOL N/A 10/16/2021   Procedure:  ESOPHAGOGASTRODUODENOSCOPY (EGD) WITH PROPOFOL;  Surgeon: Lesly Rubenstein, MD;  Location: [facility] ENDOSCOPY;  Service: Endoscopy;  Laterality: N/A;   HERNIA REPAIR     TONSILLECTOMY  1955   Family History  Problem Relation Age of Onset   Hypertension Mother    Social History   Socioeconomic History   Marital status: Married    Spouse name: Not on file   Number of children: 1   Years of education: Not on file   Highest education level: Not on file  Occupational History   Not on file  Tobacco  Use   Smoking status: Never   Smokeless tobacco: Never  Vaping Use   Vaping Use: Never used  Substance and Sexual Activity   Alcohol use: No   Drug use: No   Sexual activity: Not Currently  Other Topics Concern   Not on file  Social History Narrative   Lives alone in 3 story home    Social Determinants of Health   Financial Resource Strain: Low Risk  (11/12/2022)   Overall Financial Resource Strain (CARDIA)    Difficulty of Paying Living Expenses: Not hard at all  Food Insecurity: No Food Insecurity (11/12/2022)   Hunger Vital Sign    Worried About Running Out of Food in the Last Year: Never true    Ran Out of Food in the Last Year: Never true  Transportation Needs: No Transportation Needs (11/12/2022)   PRAPARE - Hydrologist (Medical): No    Lack of Transportation (Non-Medical): No  Physical Activity: Sufficiently Active (11/12/2022)   Exercise Vital Sign    Days of Exercise per Week: 5 days    Minutes of Exercise per Session: 30 min  Stress: No Stress Concern Present (11/12/2022)   [place]    Feeling of Stress : Not at all  Social Connections: Moderately Integrated (11/12/2022)   Social Connection and Isolation Panel [NHANES]    Frequency of Communication with Friends and Family: More than three times a week    Frequency of Social Gatherings with Friends and Family: Three times a week    Attends Religious Services: More than 4 times per year    Active Member of Clubs or Organizations: Yes    Attends Archivist Meetings: More than 4 times per year    Marital Status: Widowed    Tobacco Counseling Counseling given: Not Answered   Clinical Intake:  Pre-visit preparation completed: Yes  Pain : No/denies pain   Diabetes: Yes CBG done?: No Did pt. bring in CBG monitor from home?: No  How often do you need to have someone help you when you read instructions,  pamphlets, or other written materials from your doctor or pharmacy?: 1 - Never  Diabetic?Yes Nutrition Risk Assessment:  Has the patient had any N/V/D within the last 2 months?  No  Does the patient have any non-healing wounds?  No  Has the patient had any unintentional weight loss or weight gain?  No   Diabetes:  Is the patient diabetic?  Yes  If diabetic, was a CBG obtained today?  No  Did the patient bring in their glucometer from home?  No  How often do you monitor your CBG's? daily.   Financial Strains and Diabetes Management:  Are you having any financial strains with the device, your supplies or your medication? No .  Does the patient want to be seen by Chronic Care Management for  management of their diabetes?  No  Would the patient like to be referred to a Nutritionist or for Diabetic Management?  No   Diabetic Exams:  Diabetic Eye Exam: Completed with Dr. Gloriann Loan; requesting notes  Diabetic Foot Exam: Completed at next office visit    Interpreter Needed?: No  Information entered by :: Denman George LPN   Activities of Daily Living    11/12/2022   11:21 AM  In your present state of health, do you have any difficulty performing the following activities:  Hearing? 0  Vision? 0  Difficulty concentrating or making decisions? 0  Walking or climbing stairs? 0  Dressing or bathing? 0  Doing errands, shopping? 0  Preparing Food and eating ? N  Using the Toilet? N  In the past six months, have you accidently leaked urine? N  Do you have problems with loss of bowel control? N  Managing your Medications? N  Managing your Finances? N  Housekeeping or managing your Housekeeping? N    Patient Care Team: Susy Frizzle, MD as PCP - General (Family Medicine)  Indicate any recent Medical Services you may have received from other than [facility] providers in the past year (date may be approximate).     Assessment:   This is a routine wellness examination for  Bryar.  Hearing/Vision screen Hearing Screening - Comments:: No concerns at this time  Vision Screening - Comments:: up to date with routine eye exams with Dr. Gloriann Loan    Dietary issues and exercise activities discussed: Current Exercise Habits: Home exercise routine, Type of exercise: walking, Time (Minutes): 30, Frequency (Times/Week): 5, Weekly Exercise (Minutes/Week): 150, Intensity: Mild   Goals Addressed             This Visit's Progress    Increase physical activity        Depression Screen    11/12/2022   11:37 AM 09/18/2022    3:02 PM 08/17/2021    2:50 PM 06/11/2018    2:25 PM 12/09/2017   10:19 AM 08/08/2017   12:14 PM 06/27/2016    1:23 PM  PHQ 2/9 Scores  PHQ - 2 Score 0 0 0 0 0 0 0    Fall Risk    11/12/2022   11:23 AM 09/18/2022    3:02 PM 08/17/2021    2:58 PM 06/11/2018    2:25 PM 12/09/2017   10:19 AM  [place] in the past year? 0 0 0 No No  Number falls in past yr: 0 0 0    Injury with Fall? 0 0 0    Risk for fall due to : No Fall Risks No Fall Risks No Fall Risks    Follow up Falls evaluation completed;Education provided;Falls prevention discussed Falls prevention discussed Falls prevention discussed      FALL RISK PREVENTION PERTAINING TO THE HOME:  Any stairs in or around the home? Yes  If so, are there any without handrails? No  Home free of loose throw rugs in walkways, pet beds, electrical cords, etc? Yes  Adequate lighting in your home to reduce risk of falls? Yes   ASSISTIVE DEVICES UTILIZED TO PREVENT FALLS:  Life alert? No  Use of a cane, walker or w/c? No  Grab bars in the bathroom? Yes  Shower chair or bench in shower? No  Elevated toilet seat or a handicapped toilet? Yes   TIMED UP AND GO:  Was the test performed? Yes .  Length of time to  ambulate 10 feet: 5 sec.   Gait steady and fast without use of assistive device  Cognitive Function:        11/12/2022   11:22 AM 08/17/2021    3:04 PM  6CIT Screen  What Year?  0 points 0 points  What month? 0 points 0 points  What time? 0 points 0 points  Count back from 20 0 points 0 points  Months in reverse 0 points 2 points  Repeat phrase 2 points 0 points  Total Score 2 points 2 points    Immunizations Immunization History  Administered Date(s) Administered   Fluad Quad(high Dose 65+) 07/20/2019, 09/07/2020   Influenza Split 08/17/2014   Influenza, High Dose Seasonal PF 08/16/2014, 10/04/2015, 08/08/2017, 08/26/2018   Influenza,inj,Quad PF,6+ Mos 11/01/2016   Influenza-Unspecified 08/18/2014, 08/17/2021, 09/13/2022   Pneumococcal Conjugate-13 01/15/2012, 05/25/2014   Pneumococcal Polysaccharide-23 11/25/2016    TDAP status: Due, Education has been provided regarding the importance of this vaccine. Advised may receive this vaccine at local pharmacy or Health Dept. Aware to provide a copy of the vaccination record if obtained from local pharmacy or Health Dept. Verbalized acceptance and understanding.  Flu Vaccine status: Up to date  Pneumococcal vaccine status: Up to date  Covid-19 vaccine status: Information provided on how to obtain vaccines.   Qualifies for Shingles Vaccine? Yes   Zostavax completed Yes   Shingrix Completed?: No.    Education has been provided regarding the importance of this vaccine. Patient has been advised to call insurance company to determine out of pocket expense if they have not yet received this vaccine. Advised may also receive vaccine at local pharmacy or Health Dept. Verbalized acceptance and understanding.  Screening Tests Health Maintenance  Topic Date Due   COVID-19 Vaccine (1) Never done   Diabetic kidney evaluation - Urine ACR  Never done   DTaP/Tdap/Td (1 - Tdap) Never done   Zoster Vaccines- Shingrix (1 of 2) Never done   FOOT EXAM  03/23/2021   OPHTHALMOLOGY EXAM  11/27/2021   HEMOGLOBIN A1C  12/04/2022   Diabetic kidney evaluation - eGFR measurement  06/04/2023   Medicare Annual Wellness (AWV)   11/13/2023   Pneumonia Vaccine 59+ Years old  Completed   INFLUENZA VACCINE  Completed   DEXA SCAN  Completed   HPV VACCINES  Aged Out    Health Maintenance  Health Maintenance Due  Topic Date Due   COVID-19 Vaccine (1) Never done   Diabetic kidney evaluation - Urine ACR  Never done   DTaP/Tdap/Td (1 - Tdap) Never done   Zoster Vaccines- Shingrix (1 of 2) Never done   FOOT EXAM  03/23/2021   OPHTHALMOLOGY EXAM  11/27/2021    Colorectal cancer screening: No longer required.   Mammogram status: No longer required due to age .  Bone Density status: Completed 07/10/15. Results reflect: Bone density results: NORMAL. Repeat every 7 years.  Lung Cancer Screening: (Low Dose CT Chest recommended if Age 52-80 years, 30 pack-year currently smoking OR have quit w/in 15years.) does not qualify.   Lung Cancer Screening Referral: n/a   Additional Screening:  Hepatitis C Screening: does not qualify  Vision Screening: Recommended annual ophthalmology exams for early detection of glaucoma and other disorders of the eye. Is the patient up to date with their annual eye exam?  Yes  Who is the provider or what is the name of the office in which the patient attends annual eye exams? Dr. Gloriann Loan  If pt is not established  with a provider, would they like to be referred to a provider to establish care? No .   Dental Screening: Recommended annual dental exams for proper oral hygiene  Community Resource Referral / Chronic Care Management: CRR required this visit?  No   CCM required this visit?  No      Plan:     I have personally reviewed and noted the following in the patient's chart:   Medical and social history Use of alcohol, tobacco or illicit drugs  Current medications and supplements including opioid prescriptions. Patient is not currently taking opioid prescriptions. Functional ability and status Nutritional status Physical activity Advanced directives List of other  physicians Hospitalizations, surgeries, and ER visits in previous 12 months Vitals Screenings to include cognitive, depression, and falls Referrals and appointments  In addition, I have reviewed and discussed with patient certain preventive protocols, quality metrics, and best practice recommendations. A written personalized care plan for preventive services as well as general preventive health recommendations were provided to patient.     Denman George [place], [place]   [id number]   Nurse Notes: No concerns

## 2022-11-26 DIAGNOSIS — L821 Other seborrheic keratosis: Secondary | ICD-10-CM | POA: Diagnosis not present

## 2022-11-26 DIAGNOSIS — C44319 Basal cell carcinoma of skin of other parts of face: Secondary | ICD-10-CM | POA: Diagnosis not present

## 2022-11-26 DIAGNOSIS — D485 Neoplasm of uncertain behavior of skin: Secondary | ICD-10-CM | POA: Diagnosis not present

## 2022-12-10 ENCOUNTER — Ambulatory Visit (INDEPENDENT_AMBULATORY_CARE_PROVIDER_SITE_OTHER): Payer: Medicare Other | Admitting: Family Medicine

## 2022-12-10 ENCOUNTER — Encounter: Payer: Self-pay | Admitting: Family Medicine

## 2022-12-10 VITALS — BP 110/56 | HR 58 | Temp 97.6°F | Ht 66.0 in | Wt 170.8 lb

## 2022-12-10 DIAGNOSIS — E118 Type 2 diabetes mellitus with unspecified complications: Secondary | ICD-10-CM | POA: Diagnosis not present

## 2022-12-10 DIAGNOSIS — D509 Iron deficiency anemia, unspecified: Secondary | ICD-10-CM

## 2022-12-10 NOTE — Progress Notes (Signed)
Subjective:    Patient ID: Sherri Leon, female    DOB: Jan 11, 1936, 87 y.o.   MRN: [id number] Patient is a very sweet 87 year old Caucasian female who is here today for follow-up of her diabetes.  She denies any hypoglycemic episodes.  She denies any chest pain shortness of breath or dyspnea on exertion.  Her blood pressure is quite low today and she does report some fatigue and dizziness.  She is also requesting that I trim her toenails.  She has thick dysmorphic toenails on both of her feet.  The great toe and the second toe both feet have thick toenails that she is unable to cut.  They are causing her pain by growing into the surrounding skin.  Using a pair of rongeurs, I trimmed both toenails on both feet is much as possible Past Medical History:  Diagnosis Date   Angina at rest    Arthritis    Colon polyps    Diabetes mellitus without complication ([place])    Dysphagia    GERD (gastroesophageal reflux disease)    Hyperlipidemia    Hyperlipidemia    Hypertension    Iron deficiency anemia    Past Surgical History:  Procedure Laterality Date   [place] N/A 08/23/2015   Procedure: Left Heart Cath;  Surgeon: Leonie Man, MD;  Location: [place] [facility];  Service: Cardiovascular;  Laterality: N/A;   COLONOSCOPY WITH PROPOFOL N/A 06/13/2017   Procedure: COLONOSCOPY WITH PROPOFOL;  Surgeon: Manya Silvas, MD;  Location: [facility];  Service: Endoscopy;  Laterality: N/A;   COLONOSCOPY WITH PROPOFOL N/A 10/16/2021   Procedure: COLONOSCOPY WITH PROPOFOL;  Surgeon: Lesly Rubenstein, MD;  Location: [facility] ENDOSCOPY;  Service: Endoscopy;  Laterality: N/A;   ESOPHAGOGASTRODUODENOSCOPY (EGD) WITH PROPOFOL N/A 06/13/2017   Procedure: ESOPHAGOGASTRODUODENOSCOPY (EGD) WITH PROPOFOL;  Surgeon: Manya Silvas, MD;  [facility];  Service: Endoscopy;  Laterality: N/A;    ESOPHAGOGASTRODUODENOSCOPY (EGD) WITH PROPOFOL N/A 10/16/2021   Procedure: ESOPHAGOGASTRODUODENOSCOPY (EGD) WITH PROPOFOL;  Surgeon: Lesly Rubenstein, MD;  Location: [facility] ENDOSCOPY;  Service: Endoscopy;  Laterality: N/A;   HERNIA REPAIR     TONSILLECTOMY  1955   Current Outpatient Medications on File Prior to Visit  Medication Sig Dispense Refill   Blood Glucose Monitoring Suppl (ACCU-CHEK AVIVA PLUS) w/Device KIT PLEASE DISPENSE PER PATIENT'S INSURANCE 1 kit 0   FLUAD QUADRIVALENT 0.5 ML injection      glucose blood (ACCU-CHEK GUIDE) test strip USE AS DIRECTED TO MONITOR FASTING BLOOD SUGAR ONCE DAILY 100 strip 11   losartan (COZAAR) 100 MG tablet TAKE 1 TABLET(100 MG) BY MOUTH DAILY 90 tablet 3   metFORMIN (GLUCOPHAGE) 500 MG tablet TAKE 1 TABLET(500 MG) BY MOUTH TWICE DAILY WITH A MEAL 180 tablet 3   Multiple Vitamins-Minerals (MULTIVITAMIN ADULT PO) Take by mouth.     pantoprazole (PROTONIX) 40 MG tablet TAKE 1 TABLET(40 MG) BY MOUTH TWICE DAILY 180 tablet 0   pravastatin (PRAVACHOL) 40 MG tablet TAKE 1 TABLET BY MOUTH EVERY DAY 90 tablet 0   No current facility-administered medications on file prior to visit.   No Known Allergies Social History   Socioeconomic History   Marital status: Married    Spouse name: Not on file   Number of children: 1   Years of education: Not on file   Highest education level: Not on file  Occupational History  Not on file  Tobacco Use   Smoking status: Never   Smokeless tobacco: Never  Vaping Use   Vaping Use: Never used  Substance and Sexual Activity   Alcohol use: No   Drug use: No   Sexual activity: Not Currently  Other Topics Concern   Not on file  Social History Narrative   Lives alone in 3 story home    Social Determinants of Health   Financial Resource Strain: Low Risk  (11/12/2022)   Overall Financial Resource Strain (CARDIA)    Difficulty of Paying Living Expenses: Not hard at all  Food Insecurity: No Food Insecurity  (11/12/2022)   Hunger Vital Sign    Worried About Running Out of Food in the Last Year: Never true    Ran Out of Food in the Last Year: Never true  Transportation Needs: No Transportation Needs (11/12/2022)   PRAPARE - Hydrologist (Medical): No    Lack of Transportation (Non-Medical): No  Physical Activity: Sufficiently Active (11/12/2022)   Exercise Vital Sign    Days of Exercise per Week: 5 days    Minutes of Exercise per Session: 30 min  Stress: No Stress Concern Present (11/12/2022)   [place]    Feeling of Stress : Not at all  Social Connections: Moderately Integrated (11/12/2022)   Social Connection and Isolation Panel [NHANES]    Frequency of Communication with Friends and Family: More than three times a week    Frequency of Social Gatherings with Friends and Family: Three times a week    Attends Religious Services: More than 4 times per year    Active Member of Clubs or Organizations: Yes    Attends Archivist Meetings: More than 4 times per year    Marital Status: Widowed  Intimate Partner Violence: Not At Risk (11/12/2022)   Humiliation, Afraid, Rape, and Kick questionnaire    Fear of Current or Ex-Partner: No    Emotionally Abused: No    Physically Abused: No    Sexually Abused: No      Review of Systems  All other systems reviewed and are negative.      Objective:   Physical Exam Vitals reviewed.  Constitutional:      General: She is not in acute distress.    Appearance: She is well-developed. She is not diaphoretic.  Eyes:     Conjunctiva/sclera: Conjunctivae normal.     Pupils: Pupils are equal, round, and reactive to light.  Neck:     Thyroid: No thyromegaly.     Vascular: No JVD.  Cardiovascular:     Rate and Rhythm: Normal rate and regular rhythm.     Heart sounds: Normal heart sounds. No murmur heard.    No friction rub. No gallop.   Pulmonary:     Effort: Pulmonary effort is normal. No respiratory distress.     Breath sounds: Normal breath sounds. No stridor. No wheezing or rales.  Lymphadenopathy:     Cervical: No cervical adenopathy.      Dysmorphic toenails on both feet particularly the first and second toes of both feet     Assessment & Plan:  Controlled type 2 diabetes mellitus with complication, without long-term current use of insulin (HCC) - Plan: CBC with Differential/Platelet, Lipid panel, COMPLETE METABOLIC PANEL WITH GFR, Hemoglobin A1c, Protein / Creatinine Ratio, Urine, Iron  Iron deficiency anemia, unspecified iron deficiency anemia type - Plan: CBC with Differential/Platelet,  COMPLETE METABOLIC PANEL WITH GFR, Hemoglobin A1c, Iron Check fasting lab work today including a CBC, CMP, lipid panel and A1c.  Goal LDL cholesterol is less than 100.  Goal A1c is less than 6.5.  Patient has stopped her iron so I will recheck an iron level and hemoglobin.  Ideally I would like to keep her hemoglobin greater than 12.  Reduce losartan to 50 mg a day due to hypotension

## 2022-12-11 LAB — COMPLETE METABOLIC PANEL WITH GFR
AG Ratio: 1.8 (calc) (ref 1.0–2.5)
ALT: 13 U/L (ref 6–29)
AST: 15 U/L (ref 10–35)
Albumin: 4.3 g/dL (ref 3.6–5.1)
Alkaline phosphatase (APISO): 85 U/L (ref 37–153)
BUN: 11 mg/dL (ref 7–25)
CO2: 27 mmol/L (ref 20–32)
Calcium: 10.2 mg/dL (ref 8.6–10.4)
Chloride: 107 mmol/L (ref 98–110)
Creat: 0.65 mg/dL (ref 0.60–0.95)
Globulin: 2.4 g/dL (calc) (ref 1.9–3.7)
Glucose, Bld: 123 mg/dL — ABNORMAL HIGH (ref 65–99)
Potassium: 4.6 mmol/L (ref 3.5–5.3)
Sodium: 141 mmol/L (ref 135–146)
Total Bilirubin: 0.5 mg/dL (ref 0.2–1.2)
Total Protein: 6.7 g/dL (ref 6.1–8.1)
eGFR: 87 mL/min/{1.73_m2} (ref >=60)

## 2022-12-11 LAB — PROTEIN / CREATININE RATIO, URINE
Creatinine, Urine: 61 mg/dL (ref 20–275)
Protein/Creat Ratio: 82 mg/g creat (ref 24–184)
Protein/Creatinine Ratio: 0.082 mg/mg creat (ref 0.024–0.184)
Total Protein, Urine: 5 mg/dL (ref 5–24)

## 2022-12-11 LAB — CBC WITH DIFFERENTIAL/PLATELET
Absolute Monocytes: 399 cells/uL (ref 200–950)
Basophils Absolute: 29 cells/uL (ref 0–200)
Basophils Relative: 0.5 %
Eosinophils Absolute: 120 cells/uL (ref 15–500)
Eosinophils Relative: 2.1 %
HCT: 36.5 % (ref 35.0–45.0)
Hemoglobin: 12 g/dL (ref 11.7–15.5)
Lymphs Abs: 1904 cells/uL (ref 850–3900)
MCH: 27.1 pg (ref 27.0–33.0)
MCHC: 32.9 g/dL (ref 32.0–36.0)
MCV: 82.4 fL (ref 80.0–100.0)
MPV: 12 fL (ref 7.5–12.5)
Monocytes Relative: 7 %
Neutro Abs: 3249 cells/uL (ref 1500–7800)
Neutrophils Relative %: 57 %
Platelets: 233 10*3/uL (ref 140–400)
RBC: 4.43 10*6/uL (ref 3.80–5.10)
RDW: 12.7 % (ref 11.0–15.0)
Total Lymphocyte: 33.4 %
WBC: 5.7 10*3/uL (ref 3.8–10.8)

## 2022-12-11 LAB — HEMOGLOBIN A1C
Hgb A1c MFr Bld: 7.4 % of total Hgb — ABNORMAL HIGH (ref <5.7)
Mean Plasma Glucose: 166 mg/dL
eAG (mmol/L): 9.2 mmol/L

## 2022-12-11 LAB — LIPID PANEL
Cholesterol: 160 mg/dL (ref <200)
HDL: 53 mg/dL (ref >=50)
LDL Cholesterol (Calc): 84 mg/dL (calc)
Non-HDL Cholesterol (Calc): 107 mg/dL (calc) (ref <130)
Total CHOL/HDL Ratio: 3 (calc) (ref <5.0)
Triglycerides: 135 mg/dL (ref <150)

## 2022-12-11 LAB — IRON: Iron: 45 ug/dL (ref 45–160)

## 2022-12-12 DIAGNOSIS — E113393 Type 2 diabetes mellitus with moderate nonproliferative diabetic retinopathy without macular edema, bilateral: Secondary | ICD-10-CM | POA: Diagnosis not present

## 2022-12-16 ENCOUNTER — Telehealth: Payer: Self-pay

## 2022-12-16 NOTE — Telephone Encounter (Signed)
Pt called in to discuss lab results. Please advise   Cb#: [phone]

## 2022-12-28 ENCOUNTER — Other Ambulatory Visit: Payer: Self-pay | Admitting: Family Medicine

## 2022-12-30 ENCOUNTER — Other Ambulatory Visit: Payer: Self-pay | Admitting: Family Medicine

## 2023-01-29 ENCOUNTER — Other Ambulatory Visit: Payer: Self-pay | Admitting: Family Medicine

## 2023-01-29 DIAGNOSIS — R1319 Other dysphagia: Secondary | ICD-10-CM

## 2023-03-06 ENCOUNTER — Encounter: Payer: Self-pay | Admitting: Family Medicine

## 2023-03-06 ENCOUNTER — Ambulatory Visit (INDEPENDENT_AMBULATORY_CARE_PROVIDER_SITE_OTHER): Payer: Medicare Other | Admitting: Family Medicine

## 2023-03-06 VITALS — BP 120/74 | HR 70 | Temp 97.5°F | Ht 66.0 in | Wt 169.0 lb

## 2023-03-06 DIAGNOSIS — J302 Other seasonal allergic rhinitis: Secondary | ICD-10-CM

## 2023-03-06 NOTE — Progress Notes (Signed)
Acute Office Visit  Subjective:     Patient ID: Sherri Leon, female    DOB: May 09, 1936, 87 y.o.   MRN: [id number]  Chief Complaint  Patient presents with   Follow-up    ongoing cough; cough worsens at night and turns into wheezing - JBG\\\     HPI Patient is in today for 1 week of productive white sputum cough and congestion that is worse at night, with clear rhinorrhea. She has been mowing.  Denies malaise, fatigue, fever, shortness of breath, pleurisy. Has tried dayquil and nyquil. No know sick exposures.  Review of Systems  All other systems reviewed and are negative.   Past Medical History:  Diagnosis Date   Angina at rest    Arthritis    Colon polyps    Diabetes mellitus without complication    Dysphagia    GERD (gastroesophageal reflux disease)    Hyperlipidemia    Hyperlipidemia    Hypertension    Iron deficiency anemia    Past Surgical History:  Procedure Laterality Date   ABDOMINAL HYSTERECTOMY     ABDOMINAL HYSTERECTOMY  1970   APPENDECTOMY  1970   CARDIAC CATHETERIZATION N/A 08/23/2015   Procedure: Left Heart Cath;  Surgeon: Marykay Lex, MD;  Location: [facility] INVASIVE CV LAB;  Service: Cardiovascular;  Laterality: N/A;   COLONOSCOPY WITH PROPOFOL N/A 06/13/2017   Procedure: COLONOSCOPY WITH PROPOFOL;  Surgeon: Scot Jun, MD;  Location: [facility] ENDOSCOPY;  Service: Endoscopy;  Laterality: N/A;   COLONOSCOPY WITH PROPOFOL N/A 10/16/2021   Procedure: COLONOSCOPY WITH PROPOFOL;  Surgeon: Regis Bill, MD;  Location: [facility] ENDOSCOPY;  Service: Endoscopy;  Laterality: N/A;   ESOPHAGOGASTRODUODENOSCOPY (EGD) WITH PROPOFOL N/A 06/13/2017   Procedure: ESOPHAGOGASTRODUODENOSCOPY (EGD) WITH PROPOFOL;  Surgeon: Scot Jun, MD;  Location: [facility] ENDOSCOPY;  Service: Endoscopy;  Laterality: N/A;   ESOPHAGOGASTRODUODENOSCOPY (EGD) WITH PROPOFOL N/A 10/16/2021   Procedure: ESOPHAGOGASTRODUODENOSCOPY (EGD) WITH PROPOFOL;  Surgeon: Regis Bill,  MD;  Location: [facility] ENDOSCOPY;  Service: Endoscopy;  Laterality: N/A;   HERNIA REPAIR     TONSILLECTOMY  1955   Current Outpatient Medications on File Prior to Visit  Medication Sig Dispense Refill   Blood Glucose Monitoring Suppl (ACCU-CHEK AVIVA PLUS) w/Device KIT PLEASE DISPENSE PER PATIENT'S INSURANCE 1 kit 0   FLUAD QUADRIVALENT 0.5 ML injection      glucose blood (ACCU-CHEK GUIDE) test strip USE AS DIRECTED TO MONITOR FASTING BLOOD SUGAR ONCE DAILY 100 strip 11   losartan (COZAAR) 100 MG tablet TAKE 1 TABLET(100 MG) BY MOUTH DAILY 90 tablet 3   metFORMIN (GLUCOPHAGE) 500 MG tablet TAKE 1 TABLET(500 MG) BY MOUTH TWICE DAILY WITH A MEAL 120 tablet 0   Multiple Vitamins-Minerals (MULTIVITAMIN ADULT PO) Take by mouth.     pantoprazole (PROTONIX) 40 MG tablet TAKE 1 TABLET(40 MG) BY MOUTH TWICE DAILY 180 tablet 0   pravastatin (PRAVACHOL) 40 MG tablet TAKE 1 TABLET BY MOUTH EVERY DAY 90 tablet 0   No current facility-administered medications on file prior to visit.   No Known Allergies      Objective:    BP 120/74   Pulse 70   Temp (!) 97.5 F (36.4 C) (Oral)   Ht  (1.676 m)   Wt 169 lb (76.7 kg)   SpO2 98%   BMI 27.28 kg/m    Physical Exam Vitals and nursing note reviewed.  Constitutional:      Appearance: Normal appearance. She is normal weight.  HENT:  Head: Normocephalic and atraumatic.  Cardiovascular:     Rate and Rhythm: Normal rate and regular rhythm.     Pulses: Normal pulses.     Heart sounds: Normal heart sounds.  Pulmonary:     Effort: Pulmonary effort is normal.     Breath sounds: Normal breath sounds.  Skin:    General: Skin is warm and dry.  Neurological:     General: No focal deficit present.     Mental Status: She is alert and oriented to person, place, and time. Mental status is at baseline.  Psychiatric:        Mood and Affect: Mood normal.        Behavior: Behavior normal.        Thought Content: Thought content normal.         Judgment: Judgment normal.     No results found for any visits on 03/06/23.      Assessment & Plan:   Problem List Items Addressed This Visit     Seasonal allergies - Primary    Symptoms consistent with seasonal allergies and mild bronchitis. She declines an inhaler at this time. I encouraged her to use OTC symptomatic management including Zyrtec  daily and Mucinex for congestion. Exam was unremarkable. Instructed to return to office if symptoms persist or worsen.       No orders of the defined types were placed in this encounter.   Return if symptoms worsen or fail to improve.  Park Meo, FNP

## 2023-03-06 NOTE — Assessment & Plan Note (Signed)
Symptoms consistent with seasonal allergies and mild bronchitis. She declines an inhaler at this time. I encouraged her to use OTC symptomatic management including Zyrtec  daily and Mucinex for congestion. Exam was unremarkable. Instructed to return to office if symptoms persist or worsen.

## 2023-03-29 ENCOUNTER — Other Ambulatory Visit: Payer: Self-pay | Admitting: Family Medicine

## 2023-03-31 NOTE — Telephone Encounter (Signed)
Requested Prescriptions  Pending Prescriptions Disp Refills   metFORMIN (GLUCOPHAGE) 500 MG tablet [Pharmacy Med Name: METFORMIN 500MG  TABLETS] 180 tablet 0    Sig: TAKE 1 TABLET(500 MG) BY MOUTH TWICE DAILY WITH A MEAL     Endocrinology:  Diabetes - Biguanides Failed - 03/29/2023 11:09 AM      Failed - Valid encounter within last 6 months    Recent Outpatient Visits           1 year ago Anemia, unspecified type   [facility] Donita Brooks, MD   1 year ago OAB (overactive bladder)   [facility] Donita Brooks, MD   1 year ago Pneumonia of right lower lobe due to infectious organism   [facility] Pickard, Priscille Heidelberg, MD   1 year ago Right sided sciatica   [facility] Donita Brooks, MD   2 years ago Iron deficiency anemia, unspecified iron deficiency anemia type   [facility] Pickard, Priscille Heidelberg, MD              Passed - Cr in normal range and within 360 days    Creat  Date Value Ref Range Status  12/10/2022 0.65 0.60 - 0.95 mg/dL Final   Creatinine, Urine  Date Value Ref Range Status  12/10/2022 61 20 - 275 mg/dL Final         Passed - HBA1C is between 0 and 7.9 and within 180 days    Hemoglobin A1C  Date Value Ref Range Status  12/29/2015 6.0  Final   Hgb A1c MFr Bld  Date Value Ref Range Status  12/10/2022 7.4 (H) <5.7 % of total Hgb Final    Comment:    For someone without known diabetes, a hemoglobin A1c value of 6.5% or greater indicates that they may have  diabetes and this should be confirmed with a follow-up  test. . For someone with known diabetes, a value <7% indicates  that their diabetes is well controlled and a value  greater than or equal to 7% indicates suboptimal  control. A1c targets should be individualized based on  duration of diabetes, age, comorbid conditions, and  other considerations. . Currently, no consensus exists regarding use of hemoglobin  A1c for diagnosis of diabetes for children. .          Passed - eGFR in normal range and within 360 days    GFR, Est African American  Date Value Ref Range Status  12/12/2020 83 > OR = 60 mL/min/1.31m2 Final   GFR, Est Non African American  Date Value Ref Range Status  12/12/2020 72 > OR = 60 mL/min/1.40m2 Final   eGFR  Date Value Ref Range Status  12/10/2022 87 > OR = 60 mL/min/1.1m2 Final         Passed - B12 Level in normal range and within 720 days    Vitamin B-12  Date Value Ref Range Status  02/04/2022 362 200 - 1,100 pg/mL Final    Comment:    . Please Note: Although the reference range for vitamin B12 is [id number] pg/mL, it has been reported that between 5 and 10% of patients with values between 200 and 400 pg/mL may experience neuropsychiatric and hematologic abnormalities due to occult B12 deficiency; less than 1% of patients with values above 400 pg/mL will have symptoms. .          Passed - CBC within normal limits and completed  in the last 12 months    WBC  Date Value Ref Range Status  12/10/2022 5.7 3.8 - 10.8 Thousand/uL Final   RBC  Date Value Ref Range Status  12/10/2022 4.43 3.80 - 5.10 Million/uL Final   Hemoglobin  Date Value Ref Range Status  12/10/2022 12.0 11.7 - 15.5 g/dL Final  16/08/9603 54.0 11.1 - 15.9 g/dL Final   HCT  Date Value Ref Range Status  12/10/2022 36.5 35.0 - 45.0 % Final   Hematocrit  Date Value Ref Range Status  08/17/2015 37.3 34.0 - 46.6 % Final   MCHC  Date Value Ref Range Status  12/10/2022 32.9 32.0 - 36.0 g/dL Final   [facility]  Date Value Ref Range Status  12/10/2022 27.1 27.0 - 33.0 pg Final   MCV  Date Value Ref Range Status  12/10/2022 82.4 80.0 - 100.0 fL Final  08/17/2015 82 79 - 97 fL Final   No results found for: "PLTCOUNTKUC", "LABPLAT", "POCPLA" RDW  Date Value Ref Range Status  12/10/2022 12.7 11.0 - 15.0 % Final  08/17/2015 15.0 12.3 - 15.4 % Final

## 2023-04-03 DIAGNOSIS — H02889 Meibomian gland dysfunction of unspecified eye, unspecified eyelid: Secondary | ICD-10-CM | POA: Diagnosis not present

## 2023-04-03 DIAGNOSIS — L309 Dermatitis, unspecified: Secondary | ICD-10-CM | POA: Diagnosis not present

## 2023-04-03 DIAGNOSIS — M3501 Sicca syndrome with keratoconjunctivitis: Secondary | ICD-10-CM | POA: Diagnosis not present

## 2023-04-15 ENCOUNTER — Encounter: Payer: Self-pay | Admitting: Family Medicine

## 2023-04-15 ENCOUNTER — Ambulatory Visit (INDEPENDENT_AMBULATORY_CARE_PROVIDER_SITE_OTHER): Payer: Medicare Other | Admitting: Family Medicine

## 2023-04-15 VITALS — BP 114/72 | HR 54 | Temp 97.8°F | Ht 66.0 in | Wt 168.0 lb

## 2023-04-15 DIAGNOSIS — E118 Type 2 diabetes mellitus with unspecified complications: Secondary | ICD-10-CM

## 2023-04-15 DIAGNOSIS — D509 Iron deficiency anemia, unspecified: Secondary | ICD-10-CM

## 2023-04-15 DIAGNOSIS — R079 Chest pain, unspecified: Secondary | ICD-10-CM

## 2023-04-15 DIAGNOSIS — Z7984 Long term (current) use of oral hypoglycemic drugs: Secondary | ICD-10-CM

## 2023-04-15 NOTE — Progress Notes (Signed)
Subjective:    Patient ID: Sherri Leon, female    DOB: 1936-06-06, 87 y.o.   MRN: [id number] Patient is a very sweet 87 year old Caucasian female who is here today for follow-up of her diabetes.  Patient had a catheterization in 2016 that showed normal coronary arteries.  There was no plaque at that time.  Patient states that over the last months she started having chest pain.  The pain comes at random times without any provocation.  She works in the garden.  She takes care of her 3 acre yard mowing it.  Despite doing this physical work, she denies any chest pain with activity.  Instead the pain comes at random times.  It occurs in the center of her chest.  This is sharp sudden pain that feels like someone is jabbing her in the chest.  It lasts a few seconds and then goes away.  She denies any angina with activity.  She denies any shortness of breath.  She denies any fevers or chills or pleurisy.  She denies any hemoptysis or cough.  She does have a history of a hiatal hernia but she is already on pantoprazole.  EKG today shows sinus bradycardia with normal intervals and a normal axis with no evidence of ischemia or infarction Past Medical History:  Diagnosis Date   Angina at rest    Arthritis    Colon polyps    Diabetes mellitus without complication (HCC)    Dysphagia    GERD (gastroesophageal reflux disease)    Hyperlipidemia    Hyperlipidemia    Hypertension    Iron deficiency anemia    Past Surgical History:  Procedure Laterality Date   ABDOMINAL HYSTERECTOMY     ABDOMINAL HYSTERECTOMY  1970   APPENDECTOMY  1970   CARDIAC CATHETERIZATION N/A 08/23/2015   Procedure: Left Heart Cath;  Surgeon: Marykay Lex, MD;  Location: [facility] INVASIVE CV LAB;  Service: Cardiovascular;  Laterality: N/A;   COLONOSCOPY WITH PROPOFOL N/A 06/13/2017   Procedure: COLONOSCOPY WITH PROPOFOL;  Surgeon: Scot Jun, MD;  Location: [facility] ENDOSCOPY;  Service: Endoscopy;  Laterality: N/A;   COLONOSCOPY  WITH PROPOFOL N/A 10/16/2021   Procedure: COLONOSCOPY WITH PROPOFOL;  Surgeon: Regis Bill, MD;  Location: [facility] ENDOSCOPY;  Service: Endoscopy;  Laterality: N/A;   ESOPHAGOGASTRODUODENOSCOPY (EGD) WITH PROPOFOL N/A 06/13/2017   Procedure: ESOPHAGOGASTRODUODENOSCOPY (EGD) WITH PROPOFOL;  Surgeon: Scot Jun, MD;  Location: [facility] ENDOSCOPY;  Service: Endoscopy;  Laterality: N/A;   ESOPHAGOGASTRODUODENOSCOPY (EGD) WITH PROPOFOL N/A 10/16/2021   Procedure: ESOPHAGOGASTRODUODENOSCOPY (EGD) WITH PROPOFOL;  Surgeon: Regis Bill, MD;  Location: [facility] ENDOSCOPY;  Service: Endoscopy;  Laterality: N/A;   HERNIA REPAIR     TONSILLECTOMY  1955   Current Outpatient Medications on File Prior to Visit  Medication Sig Dispense Refill   Blood Glucose Monitoring Suppl (ACCU-CHEK AVIVA PLUS) w/Device KIT PLEASE DISPENSE PER PATIENT'S INSURANCE 1 kit 0   FLUAD QUADRIVALENT 0.5 ML injection      glucose blood (ACCU-CHEK GUIDE) test strip USE AS DIRECTED TO MONITOR FASTING BLOOD SUGAR ONCE DAILY 100 strip 11   losartan (COZAAR) 100 MG tablet TAKE 1 TABLET(100 MG) BY MOUTH DAILY 90 tablet 3   metFORMIN (GLUCOPHAGE) 500 MG tablet TAKE 1 TABLET(500 MG) BY MOUTH TWICE DAILY WITH A MEAL 180 tablet 0   Multiple Vitamins-Minerals (MULTIVITAMIN ADULT PO) Take by mouth.     pantoprazole (PROTONIX) 40 MG tablet TAKE 1 TABLET(40 MG) BY MOUTH TWICE DAILY 180 tablet 0  pravastatin (PRAVACHOL) 40 MG tablet TAKE 1 TABLET BY MOUTH EVERY DAY 90 tablet 0   No current facility-administered medications on file prior to visit.   No Known Allergies Social History   Socioeconomic History   Marital status: Married    Spouse name: Not on file   Number of children: 1   Years of education: Not on file   Highest education level: Not on file  Occupational History   Not on file  Tobacco Use   Smoking status: Never   Smokeless tobacco: Never  Vaping Use   Vaping Use: Never used  Substance and Sexual Activity    Alcohol use: No   Drug use: No   Sexual activity: Not Currently  Other Topics Concern   Not on file  Social History Narrative   Lives alone in 3 story home    Social Determinants of Health   Financial Resource Strain: Low Risk  (11/12/2022)   Overall Financial Resource Strain (CARDIA)    Difficulty of Paying Living Expenses: Not hard at all  Food Insecurity: No Food Insecurity (11/12/2022)   Hunger Vital Sign    Worried About Running Out of Food in the Last Year: Never true    Ran Out of Food in the Last Year: Never true  Transportation Needs: No Transportation Needs (11/12/2022)   PRAPARE - Administrator, Civil Service (Medical): No    Lack of Transportation (Non-Medical): No  Physical Activity: Sufficiently Active (11/12/2022)   Exercise Vital Sign    Days of Exercise per Week: 5 days    Minutes of Exercise per Session: 30 min  Stress: No Stress Concern Present (11/12/2022)   [employer] of Occupational Health - Occupational Stress Questionnaire    Feeling of Stress : Not at all  Social Connections: Moderately Integrated (11/12/2022)   Social Connection and Isolation Panel [NHANES]    Frequency of Communication with Friends and Family: More than three times a week    Frequency of Social Gatherings with Friends and Family: Three times a week    Attends Religious Services: More than 4 times per year    Active Member of Clubs or Organizations: Yes    Attends Banker Meetings: More than 4 times per year    Marital Status: Widowed  Intimate Partner Violence: Not At Risk (11/12/2022)   Humiliation, Afraid, Rape, and Kick questionnaire    Fear of Current or Ex-Partner: No    Emotionally Abused: No    Physically Abused: No    Sexually Abused: No      Review of Systems  All other systems reviewed and are negative.      Objective:   Physical Exam Vitals reviewed.  Constitutional:      General: She is not in acute distress.    Appearance:  She is well-developed. She is not diaphoretic.  Eyes:     Conjunctiva/sclera: Conjunctivae normal.     Pupils: Pupils are equal, round, and reactive to light.  Neck:     Thyroid: No thyromegaly.     Vascular: No JVD.  Cardiovascular:     Rate and Rhythm: Normal rate and regular rhythm.     Heart sounds: Normal heart sounds. No murmur heard.    No friction rub. No gallop.  Pulmonary:     Effort: Pulmonary effort is normal. No respiratory distress.     Breath sounds: Normal breath sounds. No stridor. No wheezing or rales.  Lymphadenopathy:     Cervical: No  cervical adenopathy.         Assessment & Plan:  Controlled type 2 diabetes mellitus with complication, without long-term current use of insulin (HCC) - Plan: CBC with Differential/Platelet, COMPLETE METABOLIC PANEL WITH GFR, Hemoglobin A1c, Lipid panel  Iron deficiency anemia, unspecified iron deficiency anemia type - Plan: CBC with Differential/Platelet, COMPLETE METABOLIC PANEL WITH GFR, Hemoglobin A1c, Lipid panel  Chest pain, unspecified type - Plan: EKG 12-Lead, DG Chest 2 View Chest pain is very atypical and does not suggest underlying coronary ischemia.  However the patient has numerous risk factors including advanced age and diabetes and hypertension.  Therefore I will consult cardiology for stress test.  However I believe this likely related to her hiatal hernia and therefore recommended that she elevate the head of her bed 15 degrees.  Also want her to increase pantoprazole to 40 mg twice daily until she sees cardiology.  Obtain a chest x-ray.  Also get CBC CMP A1c and lipid panel.  Goal A1c is less than 6.5.  Goal LDL cholesterol is less than 100.

## 2023-04-16 ENCOUNTER — Other Ambulatory Visit: Payer: Self-pay

## 2023-04-16 DIAGNOSIS — D509 Iron deficiency anemia, unspecified: Secondary | ICD-10-CM

## 2023-04-16 LAB — CBC WITH DIFFERENTIAL/PLATELET
Absolute Monocytes: 483 cells/uL (ref 200–950)
Basophils Absolute: 41 cells/uL (ref 0–200)
Basophils Relative: 0.6 %
Eosinophils Absolute: 88 cells/uL (ref 15–500)
Eosinophils Relative: 1.3 %
HCT: 34.9 % — ABNORMAL LOW (ref 35.0–45.0)
Hemoglobin: 11 g/dL — ABNORMAL LOW (ref 11.7–15.5)
Lymphs Abs: 2931 cells/uL (ref 850–3900)
MCH: 24.4 pg — ABNORMAL LOW (ref 27.0–33.0)
MCHC: 31.5 g/dL — ABNORMAL LOW (ref 32.0–36.0)
MCV: 77.4 fL — ABNORMAL LOW (ref 80.0–100.0)
MPV: 11.7 fL (ref 7.5–12.5)
Monocytes Relative: 7.1 %
Neutro Abs: 3257 cells/uL (ref 1500–7800)
Neutrophils Relative %: 47.9 %
Platelets: 224 10*3/uL (ref 140–400)
RBC: 4.51 10*6/uL (ref 3.80–5.10)
RDW: 15.5 % — ABNORMAL HIGH (ref 11.0–15.0)
Total Lymphocyte: 43.1 %
WBC: 6.8 10*3/uL (ref 3.8–10.8)

## 2023-04-16 LAB — COMPLETE METABOLIC PANEL WITH GFR
AG Ratio: 1.8 (calc) (ref 1.0–2.5)
ALT: 12 U/L (ref 6–29)
AST: 18 U/L (ref 10–35)
Albumin: 4.3 g/dL (ref 3.6–5.1)
Alkaline phosphatase (APISO): 80 U/L (ref 37–153)
BUN: 17 mg/dL (ref 7–25)
CO2: 23 mmol/L (ref 20–32)
Calcium: 9.8 mg/dL (ref 8.6–10.4)
Chloride: 106 mmol/L (ref 98–110)
Creat: 0.77 mg/dL (ref 0.60–0.95)
Globulin: 2.4 g/dL (calc) (ref 1.9–3.7)
Glucose, Bld: 71 mg/dL (ref 65–99)
Potassium: 4.6 mmol/L (ref 3.5–5.3)
Sodium: 140 mmol/L (ref 135–146)
Total Bilirubin: 0.5 mg/dL (ref 0.2–1.2)
Total Protein: 6.7 g/dL (ref 6.1–8.1)
eGFR: 76 mL/min/{1.73_m2} (ref >=60)

## 2023-04-16 LAB — HEMOGLOBIN A1C
Hgb A1c MFr Bld: 7.2 % of total Hgb — ABNORMAL HIGH (ref <5.7)
Mean Plasma Glucose: 160 mg/dL
eAG (mmol/L): 8.9 mmol/L

## 2023-04-16 LAB — LIPID PANEL
Cholesterol: 150 mg/dL (ref <200)
HDL: 51 mg/dL (ref >=50)
LDL Cholesterol (Calc): 79 mg/dL (calc)
Non-HDL Cholesterol (Calc): 99 mg/dL (calc) (ref <130)
Total CHOL/HDL Ratio: 2.9 (calc) (ref <5.0)
Triglycerides: 117 mg/dL (ref <150)

## 2023-04-17 ENCOUNTER — Other Ambulatory Visit: Payer: Medicare Other

## 2023-04-17 DIAGNOSIS — D509 Iron deficiency anemia, unspecified: Secondary | ICD-10-CM | POA: Diagnosis not present

## 2023-04-17 DIAGNOSIS — R0602 Shortness of breath: Secondary | ICD-10-CM | POA: Diagnosis not present

## 2023-04-18 ENCOUNTER — Other Ambulatory Visit: Payer: Self-pay | Admitting: Family Medicine

## 2023-04-18 DIAGNOSIS — R195 Other fecal abnormalities: Secondary | ICD-10-CM | POA: Diagnosis not present

## 2023-04-18 LAB — IRON,TIBC AND FERRITIN PANEL
%SAT: 15 % (calc) — ABNORMAL LOW (ref 16–45)
Ferritin: 5 ng/mL — ABNORMAL LOW (ref 16–288)
Iron: 57 ug/dL (ref 45–160)
TIBC: 392 mcg/dL (calc) (ref 250–450)

## 2023-04-21 ENCOUNTER — Other Ambulatory Visit: Payer: Self-pay

## 2023-04-21 DIAGNOSIS — D649 Anemia, unspecified: Secondary | ICD-10-CM

## 2023-04-21 MED ORDER — IRON (FERROUS SULFATE) 325 (65 FE) MG PO TABS
325.0000 mg | ORAL_TABLET | Freq: Every day | ORAL | 3 refills | Status: AC
Start: 2023-04-21 — End: ?

## 2023-04-23 LAB — FECAL GLOBIN BY IMMUNOCHEMISTRY
FECAL GLOBIN RESULT:: NOT DETECTED
MICRO NUMBER:: 15032918
SPECIMEN QUALITY:: ADEQUATE

## 2023-04-23 LAB — HOUSE ACCOUNT TRACKING

## 2023-04-28 ENCOUNTER — Telehealth: Payer: Self-pay

## 2023-04-28 ENCOUNTER — Other Ambulatory Visit: Payer: Self-pay

## 2023-04-28 DIAGNOSIS — E119 Type 2 diabetes mellitus without complications: Secondary | ICD-10-CM

## 2023-04-28 MED ORDER — BLOOD GLUCOSE TEST VI STRP
1.0000 | ORAL_STRIP | Freq: Three times a day (TID) | 0 refills | Status: DC
Start: 2023-04-28 — End: 2023-12-12

## 2023-04-28 MED ORDER — BLOOD GLUCOSE MONITORING SUPPL DEVI
1.0000 | Freq: Three times a day (TID) | 0 refills | Status: AC
Start: 2023-04-28 — End: ?

## 2023-04-28 MED ORDER — LANCET DEVICE MISC
1.0000 | Freq: Three times a day (TID) | 0 refills | Status: AC
Start: 2023-04-28 — End: 2023-05-28

## 2023-04-28 MED ORDER — LANCETS MISC. MISC
1.0000 | Freq: Three times a day (TID) | 1 refills | Status: AC
Start: 2023-04-28 — End: 2023-05-28

## 2023-04-28 NOTE — Telephone Encounter (Signed)
Pt called in stating that she may need a new glucometer. Pt stated that she went to the pharmacy and had new batteries put in the glucometer and it still does not work. Pt wanted to know what she needed to do to go about getting a new glucometer. Please advise  cb#: [phone]

## 2023-04-29 ENCOUNTER — Other Ambulatory Visit: Payer: Self-pay | Admitting: Family Medicine

## 2023-04-29 DIAGNOSIS — R1319 Other dysphagia: Secondary | ICD-10-CM

## 2023-04-30 NOTE — Telephone Encounter (Signed)
Requested Prescriptions  Pending Prescriptions Disp Refills   pantoprazole (PROTONIX) 40 MG tablet [Pharmacy Med Name: PANTOPRAZOLE 40MG  TABLETS] 180 tablet 3    Sig: TAKE 1 TABLET(40 MG) BY MOUTH TWICE DAILY     Gastroenterology: Proton Pump Inhibitors Failed - 04/29/2023  6:19 AM      Failed - Valid encounter within last 12 months    Recent Outpatient Visits           1 year ago Anemia, unspecified type   [facility] Pickard, Priscille Heidelberg, MD   1 year ago OAB (overactive bladder)   [facility] Donita Brooks, MD   1 year ago Pneumonia of right lower lobe due to infectious organism   [facility] Pickard, Priscille Heidelberg, MD   1 year ago Right sided sciatica   [facility] Donita Brooks, MD   2 years ago Iron deficiency anemia, unspecified iron deficiency anemia type   [facility] Pickard, Priscille Heidelberg, MD       Future Appointments             In 1 month Agbor-Etang, Arlys John, MD [facility]             pravastatin (PRAVACHOL) 40 MG tablet [Pharmacy Med Name: PRAVASTATIN 40MG  TABLETS] 90 tablet 3    Sig: TAKE 1 TABLET BY MOUTH EVERY DAY     Cardiovascular:  Antilipid - Statins Failed - 04/29/2023  6:19 AM      Failed - Valid encounter within last 12 months    Recent Outpatient Visits           1 year ago Anemia, unspecified type   [facility] Pickard, Priscille Heidelberg, MD   1 year ago OAB (overactive bladder)   [facility] Donita Brooks, MD   1 year ago Pneumonia of right lower lobe due to infectious organism   [facility] Pickard, Priscille Heidelberg, MD   1 year ago Right sided sciatica   [facility] Donita Brooks, MD   2 years ago Iron deficiency anemia, unspecified iron deficiency anemia type   [facility] Pickard, Priscille Heidelberg, MD       Future Appointments             In 1 month Agbor-Etang,  Arlys John, MD [facility]            Failed - Lipid Panel in normal range within the last 12 months    Cholesterol  Date Value Ref Range Status  04/15/2023 150 <200 mg/dL Final   LDL Cholesterol (Calc)  Date Value Ref Range Status  04/15/2023 79 mg/dL (calc) Final    Comment:    Reference range: <100 . Desirable range <100 mg/dL for primary prevention;   <70 mg/dL for patients with CHD or diabetic patients  with > or = 2 CHD risk factors. Marland Kitchen LDL-C is now calculated using the Martin-Hopkins  calculation, which is a validated novel method providing  better accuracy than the Friedewald equation in the  estimation of LDL-C.  Horald Pollen et al. Lenox Ahr. 4098;119(14): 2061-2068  (http://education.[facility].com/faq/FAQ164)    HDL  Date Value Ref Range Status  04/15/2023 51 > OR = 50 mg/dL Final   Triglycerides  Date Value Ref Range Status  04/15/2023 117 <150 mg/dL Final         Passed - Patient is  not pregnant

## 2023-05-27 DIAGNOSIS — D225 Melanocytic nevi of trunk: Secondary | ICD-10-CM | POA: Diagnosis not present

## 2023-05-27 DIAGNOSIS — Z08 Encounter for follow-up examination after completed treatment for malignant neoplasm: Secondary | ICD-10-CM | POA: Diagnosis not present

## 2023-05-27 DIAGNOSIS — L821 Other seborrheic keratosis: Secondary | ICD-10-CM | POA: Diagnosis not present

## 2023-05-27 DIAGNOSIS — Z85828 Personal history of other malignant neoplasm of skin: Secondary | ICD-10-CM | POA: Diagnosis not present

## 2023-06-05 DIAGNOSIS — E113393 Type 2 diabetes mellitus with moderate nonproliferative diabetic retinopathy without macular edema, bilateral: Secondary | ICD-10-CM | POA: Diagnosis not present

## 2023-06-20 ENCOUNTER — Encounter: Payer: Self-pay | Admitting: Cardiology

## 2023-06-20 ENCOUNTER — Ambulatory Visit: Payer: Medicare Other | Attending: Cardiology | Admitting: Cardiology

## 2023-06-20 VITALS — BP 120/80 | HR 55 | Ht 67.0 in | Wt 165.4 lb

## 2023-06-20 DIAGNOSIS — E785 Hyperlipidemia, unspecified: Secondary | ICD-10-CM | POA: Diagnosis not present

## 2023-06-20 DIAGNOSIS — I10 Essential (primary) hypertension: Secondary | ICD-10-CM

## 2023-06-20 DIAGNOSIS — R072 Precordial pain: Secondary | ICD-10-CM

## 2023-06-20 NOTE — Progress Notes (Signed)
Cardiology Office Note:    Date:  06/20/2023   ID:  Sherri Leon, DOB Mar 26, 1936, MRN [id number]  PCP:  Donita Brooks, MD   [facility] Providers Cardiologist:  Debbe Odea, MD     Referring MD: Donita Brooks, MD   Chief Complaint  Patient presents with   New Patient (Initial Visit)    Chest pain pt having issues with clearing throat and would like to discuss her lifeline results. Meds reviewed verbally with pt.    History of Present Illness:    Sherri Leon is a 87 y.o. female with a hx of hypertension, hyperlipidemia who presents with chest pain.    States having symptoms of chest pain over the past 6 months.  Symptoms usually occur when she bends over.  Denies any history of heart disease, denies smoking.  Also has issues with clearing her throat.  She used to sing in her church choir, has a lot of difficulty singing recently.  Denies trauma.  Prior notes/studies Left heart cath 2016 no CAD Echo 2016 normal EF.  Past Medical History:  Diagnosis Date   Angina at rest    Arthritis    Colon polyps    Diabetes mellitus without complication (HCC)    Dysphagia    GERD (gastroesophageal reflux disease)    Hyperlipidemia    Hyperlipidemia    Hypertension    Iron deficiency anemia     Past Surgical History:  Procedure Laterality Date   ABDOMINAL HYSTERECTOMY     ABDOMINAL HYSTERECTOMY  1970   APPENDECTOMY  1970   CARDIAC CATHETERIZATION N/A 08/23/2015   Procedure: Left Heart Cath;  Surgeon: Marykay Lex, MD;  Location: [facility] INVASIVE CV LAB;  Service: Cardiovascular;  Laterality: N/A;   COLONOSCOPY WITH PROPOFOL N/A 06/13/2017   Procedure: COLONOSCOPY WITH PROPOFOL;  Surgeon: Scot Jun, MD;  Location: [facility] ENDOSCOPY;  Service: Endoscopy;  Laterality: N/A;   COLONOSCOPY WITH PROPOFOL N/A 10/16/2021   Procedure: COLONOSCOPY WITH PROPOFOL;  Surgeon: Regis Bill, MD;  Location: [facility] ENDOSCOPY;  Service: Endoscopy;  Laterality: N/A;    ESOPHAGOGASTRODUODENOSCOPY (EGD) WITH PROPOFOL N/A 06/13/2017   Procedure: ESOPHAGOGASTRODUODENOSCOPY (EGD) WITH PROPOFOL;  Surgeon: Scot Jun, MD;  Location: [facility] ENDOSCOPY;  Service: Endoscopy;  Laterality: N/A;   ESOPHAGOGASTRODUODENOSCOPY (EGD) WITH PROPOFOL N/A 10/16/2021   Procedure: ESOPHAGOGASTRODUODENOSCOPY (EGD) WITH PROPOFOL;  Surgeon: Regis Bill, MD;  Location: [facility] ENDOSCOPY;  Service: Endoscopy;  Laterality: N/A;   HERNIA REPAIR     TONSILLECTOMY  1955    Current Medications: Current Meds  Medication Sig   Blood Glucose Monitoring Suppl DEVI 1 each by Does not apply route in the morning, at noon, and at bedtime. May substitute to any manufacturer covered by patient's insurance.   FLUAD QUADRIVALENT 0.5 ML injection    Iron, Ferrous Sulfate, 325 (65 Fe) MG TABS Take 325 mg by mouth daily.   losartan (COZAAR) 100 MG tablet TAKE 1 TABLET(100 MG) BY MOUTH DAILY   metFORMIN (GLUCOPHAGE) 500 MG tablet TAKE 1 TABLET(500 MG) BY MOUTH TWICE DAILY WITH A MEAL   Multiple Vitamins-Minerals (MULTIVITAMIN ADULT PO) Take by mouth.   pantoprazole (PROTONIX) 40 MG tablet TAKE 1 TABLET(40 MG) BY MOUTH TWICE DAILY   pravastatin (PRAVACHOL) 40 MG tablet TAKE 1 TABLET BY MOUTH EVERY DAY     Allergies:   Patient has no known allergies.   Social History   Socioeconomic History   Marital status: Married    Spouse name:  Not on file   Number of children: 1   Years of education: Not on file   Highest education level: Not on file  Occupational History   Not on file  Tobacco Use   Smoking status: Never   Smokeless tobacco: Never  Vaping Use   Vaping status: Never Used  Substance and Sexual Activity   Alcohol use: No   Drug use: No   Sexual activity: Not Currently  Other Topics Concern   Not on file  Social History Narrative   Lives alone in 3 story home    Social Determinants of Health   Financial Resource Strain: Low Risk  (11/12/2022)   Overall Financial  Resource Strain (CARDIA)    Difficulty of Paying Living Expenses: Not hard at all  Food Insecurity: No Food Insecurity (11/12/2022)   Hunger Vital Sign    Worried About Running Out of Food in the Last Year: Never true    Ran Out of Food in the Last Year: Never true  Transportation Needs: No Transportation Needs (11/12/2022)   PRAPARE - Administrator, Civil Service (Medical): No    Lack of Transportation (Non-Medical): No  Physical Activity: Sufficiently Active (11/12/2022)   Exercise Vital Sign    Days of Exercise per Week: 5 days    Minutes of Exercise per Session: 30 min  Stress: No Stress Concern Present (11/12/2022)   [employer] of Occupational Health - Occupational Stress Questionnaire    Feeling of Stress : Not at all  Social Connections: Moderately Integrated (11/12/2022)   Social Connection and Isolation Panel [NHANES]    Frequency of Communication with Friends and Family: More than three times a week    Frequency of Social Gatherings with Friends and Family: Three times a week    Attends Religious Services: More than 4 times per year    Active Member of Clubs or Organizations: Yes    Attends Banker Meetings: More than 4 times per year    Marital Status: Widowed     Family History: The patient's family history includes Hypertension in her mother.  ROS:   Please see the history of present illness.     All other systems reviewed and are negative.  EKGs/Labs/Other Studies Reviewed:    The following studies were reviewed today:  EKG Interpretation Date/Time:  Friday June 20 2023 10:05:58 EDT Ventricular Rate:  55 PR Interval:  136 QRS Duration:  88 QT Interval:  452 QTC Calculation: 432 R Axis:   8  Text Interpretation: Sinus bradycardia Low voltage QRS Confirmed by Debbe Odea ([id number]) on 06/20/2023 10:28:33 AM    Recent Labs: 04/15/2023: ALT 12; BUN 17; Creat 0.77; Hemoglobin 11.0; Platelets 224; Potassium 4.6; Sodium 140   Recent Lipid Panel    Component Value Date/Time   CHOL 150 04/15/2023 1213   TRIG 117 04/15/2023 1213   HDL 51 04/15/2023 1213   CHOLHDL 2.9 04/15/2023 1213   VLDL 34 (H) 04/18/2017 1222   LDLCALC 79 04/15/2023 1213     Risk Assessment/Calculations:             Physical Exam:    VS:  BP 120/80 (BP Location: Right Arm, Patient Position: Sitting, Cuff Size: Normal)   Pulse (!) 55   Ht 5\' 7"  (1.702 m)   Wt 165 lb 6 oz (75 kg)   SpO2 98%   BMI 25.90 kg/m     Wt Readings from Last 3 Encounters:  06/20/23 165 lb 6 oz (  75 kg)  04/15/23 168 lb (76.2 kg)  03/06/23 169 lb (76.7 kg)     GEN:  Well nourished, well developed in no acute distress HEENT: Normal NECK: No JVD; No carotid bruits CARDIAC: RRR, no murmurs, rubs, gallops RESPIRATORY:  Clear to auscultation without rales, wheezing or rhonchi  ABDOMEN: Soft, non-tender, non-distended MUSCULOSKELETAL:  No edema; chest wall tenderness on palpation SKIN: Warm and dry NEUROLOGIC:  Alert and oriented x 3 PSYCHIATRIC:  Normal affect   ASSESSMENT:    1. Precordial pain   2. Primary hypertension   3. Hyperlipidemia, unspecified hyperlipidemia type    PLAN:    In order of problems listed above:  Chest pain, likely musculoskeletal with left-sided chest wall tenderness reproducible with palpation.  Given age and risk factors, will obtain echo and coronary CTA to rule out cardiac etiology.  If cardiac workup is unrevealing, recommend follow-up with PCP for musculoskeletal/chest wall pain management. Hypertension, BP controlled.  Continue losartan. Hyperlipidemia, cholesterol controlled.  On Pravachol 40 mg daily.  Follow-up after echo and coronary CTA.      Medication Adjustments/Labs and Tests Ordered: Current medicines are reviewed at length with the patient today.  Concerns regarding medicines are outlined above.  Orders Placed This Encounter  Procedures   CT CORONARY MORPH W/CTA COR W/SCORE W/CA W/CM &/OR WO/CM    Basic Metabolic Panel (BMET)   EKG 12-Lead   ECHOCARDIOGRAM COMPLETE   No orders of the defined types were placed in this encounter.   Patient Instructions  Medication Instructions:   Your physician recommends that you continue on your current medications as directed. Please refer to the Current Medication list given to you today.  *If you need a refill on your cardiac medications before your next appointment, please call your pharmacy*   Lab Work:  Your physician recommends you have labs today - BMP  If you have labs (blood work) drawn today and your tests are completely normal, you will receive your results only by: MyChart Message (if you have MyChart) OR A paper copy in the mail If you have any lab test that is abnormal or we need to change your treatment, we will call you to review the results.   Testing/Procedures:  Your physician has requested that you have an echocardiogram. Echocardiography is a painless test that uses sound waves to create images of your heart. It provides your doctor with information about the size and shape of your heart and how well your heart's chambers and valves are working. This procedure takes approximately one hour. There are no restrictions for this procedure. Please do NOT wear cologne, perfume, aftershave, or lotions (deodorant is allowed). Please arrive 15 minutes prior to your appointment time.    Your cardiac CT will be scheduled at one of the below locations:   [facility] [address] [place], [place] [address] [phone]  OR   [facility] - Heart and Vascular Entrance  [address] [place], [place] [address] [phone]     If scheduled at [facility] or [facility], please arrive 15 mins early for check-in and test prep.  There is spacious parking and easy access to the radiology department from  the [facility] Heart and Vascular entrance. Please enter here and check-in with the desk attendant.   Please follow these instructions carefully (unless otherwise directed):  An IV will be required for this test and Nitroglycerin will be given.  Hold all erectile dysfunction medications at least 3 days (72 hrs) prior to test. (Ie viagra, cialis, sildenafil, tadalafil, etc)   On the Night Before the Test: Be sure to Drink plenty of water. Do not consume any caffeinated/decaffeinated beverages or chocolate 12 hours prior to your test. Do not take any antihistamines 12 hours prior to your test.   On the Day of the Test: Drink plenty of water until 1 hour prior to the test. Do not eat any food 1 hour prior to test. You may take your regular medications prior to the test.  If you take Furosemide/Hydrochlorothiazide/Spironolactone, please HOLD on the morning of the test. FEMALES- please wear underwire-free bra if available, avoid dresses & tight clothing      After the Test: Drink plenty of water. After receiving IV contrast, you may experience a mild flushed feeling. This is normal. On occasion, you may experience a mild rash up to 24 hours after the test. This is not dangerous. If this occurs, you can take Benadryl 25 mg and increase your fluid intake. If you experience trouble breathing, this can be serious. If it is severe call 911 IMMEDIATELY. If it is mild, please call our office. If you take any of these medications: Glipizide/Metformin, Avandament, Glucavance, please do not take 48 hours after completing test unless otherwise instructed.  We will call to schedule your test 2-4 weeks out understanding that some insurance companies will need an authorization prior to the service being performed.   For more information and frequently asked questions, please visit our website : [email or web address]  For non-scheduling related questions, please contact the cardiac imaging nurse  navigator should you have any questions/concerns: Cardiac Imaging Nurse Navigators Direct Office Dial: [phone]   For scheduling needs, including cancellations and rescheduling, please call [place], [phone].   Follow-Up: At [facility], you and your health needs are our priority.  As part of our continuing mission to provide you with exceptional heart care, we have created designated Provider Care Teams.  These Care Teams include your primary Cardiologist (physician) and Advanced Practice Providers (APPs -  Physician Assistants and Nurse Practitioners) who all work together to provide you with the care you need, when you need it.  We recommend signing up for the patient portal called "MyChart".  Sign up information is provided on this After Visit Summary.  MyChart is used to connect with patients for Virtual Visits (Telemedicine).  Patients are able to view lab/test results, encounter notes, upcoming appointments, etc.  Non-urgent messages can be sent to your provider as well.   To learn more about what you can do with MyChart, go to [email or web address].    Your next appointment:    After Cardiac Test  Provider:   You may see Debbe Odea, MD or one of the following Advanced Practice Providers on your designated Care Team:   Nicolasa Ducking, NP Eula Listen, PA-C Cadence Fransico Michael, PA-C Charlsie Quest, NP   Signed, Debbe Odea, MD  06/20/2023 12:26 PM    [facility]

## 2023-06-20 NOTE — Patient Instructions (Signed)
Medication Instructions:   Your physician recommends that you continue on your current medications as directed. Please refer to the Current Medication list given to you today.  *If you need a refill on your cardiac medications before your next appointment, please call your pharmacy*   Lab Work:  Your physician recommends you have labs today - BMP  If you have labs (blood work) drawn today and your tests are completely normal, you will receive your results only by: MyChart Message (if you have MyChart) OR A paper copy in the mail If you have any lab test that is abnormal or we need to change your treatment, we will call you to review the results.   Testing/Procedures:  Your physician has requested that you have an echocardiogram. Echocardiography is a painless test that uses sound waves to create images of your heart. It provides your doctor with information about the size and shape of your heart and how well your heart's chambers and valves are working. This procedure takes approximately one hour. There are no restrictions for this procedure. Please do NOT wear cologne, perfume, aftershave, or lotions (deodorant is allowed). Please arrive 15 minutes prior to your appointment time.    Your cardiac CT will be scheduled at one of the below locations:   [facility] [address] [place], [place] [address] [phone]  OR   [facility] - Heart and Vascular Entrance  [address] [place], [place] [address] [phone]     If scheduled at [facility] or [facility], please arrive 15 mins early for check-in and test prep.  There is spacious parking and easy access to the radiology department from the [facility] Heart and Vascular entrance. Please enter here and check-in with the desk attendant.   Please follow these instructions carefully (unless otherwise  directed):  An IV will be required for this test and Nitroglycerin will be given.  Hold all erectile dysfunction medications at least 3 days (72 hrs) prior to test. (Ie viagra, cialis, sildenafil, tadalafil, etc)   On the Night Before the Test: Be sure to Drink plenty of water. Do not consume any caffeinated/decaffeinated beverages or chocolate 12 hours prior to your test. Do not take any antihistamines 12 hours prior to your test.   On the Day of the Test: Drink plenty of water until 1 hour prior to the test. Do not eat any food 1 hour prior to test. You may take your regular medications prior to the test.  If you take Furosemide/Hydrochlorothiazide/Spironolactone, please HOLD on the morning of the test. FEMALES- please wear underwire-free bra if available, avoid dresses & tight clothing      After the Test: Drink plenty of water. After receiving IV contrast, you may experience a mild flushed feeling. This is normal. On occasion, you may experience a mild rash up to 24 hours after the test. This is not dangerous. If this occurs, you can take Benadryl 25 mg and increase your fluid intake. If you experience trouble breathing, this can be serious. If it is severe call 911 IMMEDIATELY. If it is mild, please call our office. If you take any of these medications: Glipizide/Metformin, Avandament, Glucavance, please do not take 48 hours after completing test unless otherwise instructed.  We will call to schedule your test 2-4 weeks out understanding that some insurance companies will need an authorization prior to the service being performed.   For more information and  frequently asked questions, please visit our website : [email or web address]  For non-scheduling related questions, please contact the cardiac imaging nurse navigator should you have any questions/concerns: Cardiac Imaging Nurse Navigators Direct Office Dial: [phone]   For scheduling needs, including  cancellations and rescheduling, please call [place], [phone].   Follow-Up: At [facility], you and your health needs are our priority.  As part of our continuing mission to provide you with exceptional heart care, we have created designated Provider Care Teams.  These Care Teams include your primary Cardiologist (physician) and Advanced Practice Providers (APPs -  Physician Assistants and Nurse Practitioners) who all work together to provide you with the care you need, when you need it.  We recommend signing up for the patient portal called "MyChart".  Sign up information is provided on this After Visit Summary.  MyChart is used to connect with patients for Virtual Visits (Telemedicine).  Patients are able to view lab/test results, encounter notes, upcoming appointments, etc.  Non-urgent messages can be sent to your provider as well.   To learn more about what you can do with MyChart, go to [email or web address].    Your next appointment:    After Cardiac Test  Provider:   You may see Debbe Odea, MD or one of the following Advanced Practice Providers on your designated Care Team:   Nicolasa Ducking, NP Eula Listen, PA-C Cadence Fransico Michael, PA-C Charlsie Quest, NP

## 2023-07-07 ENCOUNTER — Telehealth (HOSPITAL_COMMUNITY): Payer: Self-pay | Admitting: Emergency Medicine

## 2023-07-07 NOTE — Telephone Encounter (Signed)
Reaching out to patient to offer assistance regarding upcoming cardiac imaging study; pt verbalizes understanding of appt date/time, parking situation and where to check in, pre-test NPO status and medications ordered, and verified current allergies; name and call back number provided for further questions should they arise Sara Wallace RN Navigator Cardiac Imaging [facility] [phone] office [phone] cell 

## 2023-07-09 ENCOUNTER — Ambulatory Visit
Admission: RE | Admit: 2023-07-09 | Discharge: 2023-07-09 | Disposition: A | Discharge status: Home / Self Care | Payer: Medicare Other | Source: Ambulatory Visit | Attending: Cardiology | Admitting: Cardiology

## 2023-07-09 DIAGNOSIS — R072 Precordial pain: Secondary | ICD-10-CM | POA: Diagnosis not present

## 2023-07-09 MED ORDER — IOHEXOL 350 MG/ML SOLN
80.0000 mL | Freq: Once | INTRAVENOUS | Status: AC | PRN
  Administered 2023-07-09: 80 mL via INTRAVENOUS

## 2023-07-09 MED ORDER — NITROGLYCERIN 0.4 MG SL SUBL
0.8000 mg | SUBLINGUAL_TABLET | Freq: Once | SUBLINGUAL | Status: AC
  Administered 2023-07-09: 0.8 mg via SUBLINGUAL
  Filled 2023-07-09: qty 25

## 2023-07-09 MED ORDER — NITROGLYCERIN 0.4 MG SL SUBL
SUBLINGUAL_TABLET | SUBLINGUAL | Status: AC
  Filled 2023-07-09: qty 1

## 2023-07-09 NOTE — Progress Notes (Signed)

## 2023-07-24 ENCOUNTER — Ambulatory Visit: Payer: Medicare Other | Attending: Cardiology

## 2023-07-24 DIAGNOSIS — R072 Precordial pain: Secondary | ICD-10-CM

## 2023-07-25 LAB — ECHOCARDIOGRAM COMPLETE
Area-P 1/2: 2.54 cm2
S' Lateral: 3 cm

## 2023-08-06 ENCOUNTER — Ambulatory Visit: Payer: Medicare Other | Attending: Cardiology | Admitting: Cardiology

## 2023-08-06 ENCOUNTER — Encounter: Payer: Self-pay | Admitting: Cardiology

## 2023-08-06 VITALS — BP 140/76 | HR 57 | Ht 67.0 in | Wt 162.6 lb

## 2023-08-06 DIAGNOSIS — I2510 Atherosclerotic heart disease of native coronary artery without angina pectoris: Secondary | ICD-10-CM

## 2023-08-06 DIAGNOSIS — I10 Essential (primary) hypertension: Secondary | ICD-10-CM

## 2023-08-06 DIAGNOSIS — E785 Hyperlipidemia, unspecified: Secondary | ICD-10-CM

## 2023-08-06 NOTE — Progress Notes (Signed)
Cardiology Office Note:    Date:  08/06/2023   ID:  Sherri Leon, DOB 27-Nov-1935, MRN [id number]  PCP:  Sherri Brooks, MD   [facility] Providers Cardiologist:  Sherri Odea, MD     Referring MD: Sherri Brooks, MD   Chief Complaint  Patient presents with   Follow-up    Discuss cardiac testing results.  Patient denies new or acute cardiac problems/concerns today.      History of Present Illness:    Sherri Leon is a 87 y.o. female with a hx of hypertension, hyperlipidemia who presents for follow-up.    Previously seen due to symptoms of chest pain.  Etiology was deemed likely musculoskeletal.  Echo and coronary CTA was obtained to evaluate cardiac etiology.  She feels well overall, no new concerns, still complains of left chest tenderness on palpation.  Prior notes/studies Left heart cath 2016 no CAD Echo 2016 normal EF.  Past Medical History:  Diagnosis Date   Angina at rest    Arthritis    Colon polyps    Diabetes mellitus without complication (HCC)    Dysphagia    GERD (gastroesophageal reflux disease)    Hyperlipidemia    Hyperlipidemia    Hypertension    Iron deficiency anemia     Past Surgical History:  Procedure Laterality Date   ABDOMINAL HYSTERECTOMY     ABDOMINAL HYSTERECTOMY  1970   APPENDECTOMY  1970   CARDIAC CATHETERIZATION N/A 08/23/2015   Procedure: Left Heart Cath;  Surgeon: Marykay Lex, MD;  Location: [facility] INVASIVE CV LAB;  Service: Cardiovascular;  Laterality: N/A;   COLONOSCOPY WITH PROPOFOL N/A 06/13/2017   Procedure: COLONOSCOPY WITH PROPOFOL;  Surgeon: Scot Jun, MD;  Location: [facility] ENDOSCOPY;  Service: Endoscopy;  Laterality: N/A;   COLONOSCOPY WITH PROPOFOL N/A 10/16/2021   Procedure: COLONOSCOPY WITH PROPOFOL;  Surgeon: Regis Bill, MD;  Location: [facility] ENDOSCOPY;  Service: Endoscopy;  Laterality: N/A;   ESOPHAGOGASTRODUODENOSCOPY (EGD) WITH PROPOFOL N/A 06/13/2017   Procedure:  ESOPHAGOGASTRODUODENOSCOPY (EGD) WITH PROPOFOL;  Surgeon: Scot Jun, MD;  Location: [facility] ENDOSCOPY;  Service: Endoscopy;  Laterality: N/A;   ESOPHAGOGASTRODUODENOSCOPY (EGD) WITH PROPOFOL N/A 10/16/2021   Procedure: ESOPHAGOGASTRODUODENOSCOPY (EGD) WITH PROPOFOL;  Surgeon: Regis Bill, MD;  Location: [facility] ENDOSCOPY;  Service: Endoscopy;  Laterality: N/A;   HERNIA REPAIR     TONSILLECTOMY  1955    Current Medications: Current Meds  Medication Sig   Blood Glucose Monitoring Suppl DEVI 1 each by Does not apply route in the morning, at noon, and at bedtime. May substitute to any manufacturer covered by patient's insurance.   Iron, Ferrous Sulfate, 325 (65 Fe) MG TABS Take 325 mg by mouth daily.   losartan (COZAAR) 100 MG tablet TAKE 1 TABLET(100 MG) BY MOUTH DAILY   metFORMIN (GLUCOPHAGE) 500 MG tablet TAKE 1 TABLET(500 MG) BY MOUTH TWICE DAILY WITH A MEAL   Multiple Vitamins-Minerals (MULTIVITAMIN ADULT PO) Take by mouth.   pantoprazole (PROTONIX) 40 MG tablet TAKE 1 TABLET(40 MG) BY MOUTH TWICE DAILY   pravastatin (PRAVACHOL) 40 MG tablet TAKE 1 TABLET BY MOUTH EVERY DAY     Allergies:   Patient has no known allergies.   Social History   Socioeconomic History   Marital status: Married    Spouse name: Not on file   Number of children: 1   Years of education: Not on file   Highest education level: Not on file  Occupational History   Not on  file  Tobacco Use   Smoking status: Never   Smokeless tobacco: Never  Vaping Use   Vaping status: Never Used  Substance and Sexual Activity   Alcohol use: No   Drug use: No   Sexual activity: Not Currently  Other Topics Concern   Not on file  Social History Narrative   Lives alone in 3 story home    Social Determinants of Health   Financial Resource Strain: Low Risk  (11/12/2022)   Overall Financial Resource Strain (CARDIA)    Difficulty of Paying Living Expenses: Not hard at all  Food Insecurity: No Food Insecurity  (11/12/2022)   Hunger Vital Sign    Worried About Running Out of Food in the Last Year: Never true    Ran Out of Food in the Last Year: Never true  Transportation Needs: No Transportation Needs (11/12/2022)   PRAPARE - Administrator, Civil Service (Medical): No    Lack of Transportation (Non-Medical): No  Physical Activity: Sufficiently Active (11/12/2022)   Exercise Vital Sign    Days of Exercise per Week: 5 days    Minutes of Exercise per Session: 30 min  Stress: No Stress Concern Present (11/12/2022)   [employer] of Occupational Health - Occupational Stress Questionnaire    Feeling of Stress : Not at all  Social Connections: Moderately Integrated (11/12/2022)   Social Connection and Isolation Panel [NHANES]    Frequency of Communication with Friends and Family: More than three times a week    Frequency of Social Gatherings with Friends and Family: Three times a week    Attends Religious Services: More than 4 times per year    Active Member of Clubs or Organizations: Yes    Attends Banker Meetings: More than 4 times per year    Marital Status: Widowed     Family History: The patient's family history includes Hypertension in her mother.  ROS:   Please see the history of present illness.     All other systems reviewed and are negative.  EKGs/Labs/Other Studies Reviewed:    The following studies were reviewed today:       Recent Labs: 04/15/2023: ALT 12; Hemoglobin 11.0; Platelets 224 06/20/2023: BUN 14; Creatinine, Ser 0.74; Potassium 4.8; Sodium 140  Recent Lipid Panel    Component Value Date/Time   CHOL 150 04/15/2023 1213   TRIG 117 04/15/2023 1213   HDL 51 04/15/2023 1213   CHOLHDL 2.9 04/15/2023 1213   VLDL 34 (H) 04/18/2017 1222   LDLCALC 79 04/15/2023 1213     Risk Assessment/Calculations:     Physical Exam:    VS:  BP (!) 140/76 (BP Location: Left Arm, Patient Position: Sitting, Cuff Size: Large)   Pulse (!) 57   Ht 5'  7" (1.702 m)   Wt 162 lb 9.6 oz (73.8 kg)   SpO2 98%   BMI 25.47 kg/m     Wt Readings from Last 3 Encounters:  08/06/23 162 lb 9.6 oz (73.8 kg)  06/20/23 165 lb 6 oz (75 kg)  04/15/23 168 lb (76.2 kg)     GEN:  Well nourished, well developed in no acute distress HEENT: Normal NECK: No JVD; No carotid bruits CARDIAC: RRR, no murmurs, rubs, gallops RESPIRATORY:  Clear to auscultation without rales, wheezing or rhonchi  ABDOMEN: Soft, non-tender, non-distended MUSCULOSKELETAL:  No edema; chest wall tenderness on palpation SKIN: Warm and dry NEUROLOGIC:  Alert and oriented x 3 PSYCHIATRIC:  Normal affect   ASSESSMENT:  1. Coronary artery disease involving native coronary artery of native heart, unspecified whether angina present   2. Primary hypertension   3. Hyperlipidemia, unspecified hyperlipidemia type    PLAN:    In order of problems listed above:  Chest pain, coronary CT mild nonobstructive CAD in RCA and LAD.  Echo with EF 55 to 60%, etiology likely musculoskeletal with left-sided chest wall tenderness reproducible with palpation.  Continue protocol 40 mg daily.  Recommend follow-up with PCP for musculoskeletal/chest wall pain management. Hypertension, BP elevated today, usually controlled.  Continue losartan 100 mg dai. Hyperlipidemia, cholesterol controlled.  Continue Pravachol 40 mg daily.  Follow-up in 1 year.     Medication Adjustments/Labs and Tests Ordered: Current medicines are reviewed at length with the patient today.  Concerns regarding medicines are outlined above.  No orders of the defined types were placed in this encounter.  No orders of the defined types were placed in this encounter.   Patient Instructions  Medication Instructions:  No changes *If you need a refill on your cardiac medications before your next appointment, please call your pharmacy*   Lab Work: None ordered If you have labs (blood work) drawn today and your tests are  completely normal, you will receive your results only by: MyChart Message (if you have MyChart) OR A paper copy in the mail If you have any lab test that is abnormal or we need to change your treatment, we will call you to review the results.   Testing/Procedures: None ordered   Follow-Up: At [facility], you and your health needs are our priority.  As part of our continuing mission to provide you with exceptional heart care, we have created designated Provider Care Teams.  These Care Teams include your primary Cardiologist (physician) and Advanced Practice Providers (APPs -  Physician Assistants and Nurse Practitioners) who all work together to provide you with the care you need, when you need it.  We recommend signing up for the patient portal called "MyChart".  Sign up information is provided on this After Visit Summary.  MyChart is used to connect with patients for Virtual Visits (Telemedicine).  Patients are able to view lab/test results, encounter notes, upcoming appointments, etc.  Non-urgent messages can be sent to your provider as well.   To learn more about what you can do with MyChart, go to [email or web address].    Your next appointment:   12 month(s)  Provider:   You may see Sherri Odea, MD or one of the following Advanced Practice Providers on your designated Care Team:   Nicolasa Ducking, NP Eula Listen, PA-C Cadence Fransico Michael, PA-C Charlsie Quest, NP      Signed, Sherri Odea, MD  08/06/2023 11:02 AM    [facility]

## 2023-08-06 NOTE — Patient Instructions (Signed)
Medication Instructions:  No changes *If you need a refill on your cardiac medications before your next appointment, please call your pharmacy*   Lab Work: None ordered If you have labs (blood work) drawn today and your tests are completely normal, you will receive your results only by: MyChart Message (if you have MyChart) OR A paper copy in the mail If you have any lab test that is abnormal or we need to change your treatment, we will call you to review the results.   Testing/Procedures: None ordered   Follow-Up: At [facility], you and your health needs are our priority.  As part of our continuing mission to provide you with exceptional heart care, we have created designated Provider Care Teams.  These Care Teams include your primary Cardiologist (physician) and Advanced Practice Providers (APPs -  Physician Assistants and Nurse Practitioners) who all work together to provide you with the care you need, when you need it.  We recommend signing up for the patient portal called "MyChart".  Sign up information is provided on this After Visit Summary.  MyChart is used to connect with patients for Virtual Visits (Telemedicine).  Patients are able to view lab/test results, encounter notes, upcoming appointments, etc.  Non-urgent messages can be sent to your provider as well.   To learn more about what you can do with MyChart, go to [email or web address].    Your next appointment:   12 month(s)  Provider:   You may see Debbe Odea, MD or one of the following Advanced Practice Providers on your designated Care Team:   Nicolasa Ducking, NP Eula Listen, PA-C Cadence Fransico Michael, PA-C Charlsie Quest, NP

## 2023-08-11 ENCOUNTER — Other Ambulatory Visit: Payer: Self-pay | Admitting: Family Medicine

## 2023-08-12 NOTE — Telephone Encounter (Signed)
Requested Prescriptions  Pending Prescriptions Disp Refills   losartan (COZAAR) 100 MG tablet [Pharmacy Med Name: LOSARTAN 100MG  TABLETS] 90 tablet 0    Sig: TAKE 1 TABLET(100 MG) BY MOUTH DAILY     Cardiovascular:  Angiotensin Receptor Blockers Failed - 08/11/2023  6:20 AM      Failed - Last BP in normal range    BP Readings from Last 1 Encounters:  08/06/23 (!) 140/76         Failed - Valid encounter within last 6 months    Recent Outpatient Visits           1 year ago Anemia, unspecified type   [facility] Donita Brooks, MD   1 year ago OAB (overactive bladder)   [facility] Donita Brooks, MD   2 years ago Pneumonia of right lower lobe due to infectious organism   [facility] Pickard, Priscille Heidelberg, MD   2 years ago Right sided sciatica   [facility] Donita Brooks, MD   2 years ago Iron deficiency anemia, unspecified iron deficiency anemia type   [facility] Pickard, Priscille Heidelberg, MD              Passed - Cr in normal range and within 180 days    Creat  Date Value Ref Range Status  04/15/2023 0.77 0.60 - 0.95 mg/dL Final   Creatinine, Ser  Date Value Ref Range Status  06/20/2023 0.74 0.57 - 1.00 mg/dL Final   Creatinine, Urine  Date Value Ref Range Status  12/10/2022 61 20 - 275 mg/dL Final         Passed - K in normal range and within 180 days    Potassium  Date Value Ref Range Status  06/20/2023 4.8 3.5 - 5.2 mmol/L Final         Passed - Patient is not pregnant

## 2023-08-15 ENCOUNTER — Other Ambulatory Visit: Payer: Self-pay | Admitting: Family Medicine

## 2023-08-15 DIAGNOSIS — D649 Anemia, unspecified: Secondary | ICD-10-CM

## 2023-08-15 NOTE — Telephone Encounter (Signed)
Due to a system glitch the last office visit is not detected for this practice correctly.    LOV 04/15/2023.  Labs in date.  Requested Prescriptions  Pending Prescriptions Disp Refills   metFORMIN (GLUCOPHAGE) 500 MG tablet [Pharmacy Med Name: METFORMIN 500MG  TABLETS] 180 tablet 0    Sig: TAKE 1 TABLET(500 MG) BY MOUTH TWICE DAILY WITH A MEAL     Endocrinology:  Diabetes - Biguanides Failed - 08/15/2023  8:45 AM      Failed - Valid encounter within last 6 months    Recent Outpatient Visits           1 year ago Anemia, unspecified type   [facility] Donita Brooks, MD   1 year ago OAB (overactive bladder)   [facility] Donita Brooks, MD   2 years ago Pneumonia of right lower lobe due to infectious organism   [facility] Pickard, Priscille Heidelberg, MD   2 years ago Right sided sciatica   [facility] Donita Brooks, MD   2 years ago Iron deficiency anemia, unspecified iron deficiency anemia type   [facility] Pickard, Priscille Heidelberg, MD              Passed - Cr in normal range and within 360 days    Creat  Date Value Ref Range Status  04/15/2023 0.77 0.60 - 0.95 mg/dL Final   Creatinine, Ser  Date Value Ref Range Status  06/20/2023 0.74 0.57 - 1.00 mg/dL Final   Creatinine, Urine  Date Value Ref Range Status  12/10/2022 61 20 - 275 mg/dL Final         Passed - HBA1C is between 0 and 7.9 and within 180 days    Hemoglobin A1C  Date Value Ref Range Status  12/29/2015 6.0  Final   Hgb A1c MFr Bld  Date Value Ref Range Status  04/15/2023 7.2 (H) <5.7 % of total Hgb Final    Comment:    For someone without known diabetes, a hemoglobin A1c value of 6.5% or greater indicates that they may have  diabetes and this should be confirmed with a follow-up  test. . For someone with known diabetes, a value <7% indicates  that their diabetes is well controlled and a value  greater than or equal to 7%  indicates suboptimal  control. A1c targets should be individualized based on  duration of diabetes, age, comorbid conditions, and  other considerations. . Currently, no consensus exists regarding use of hemoglobin A1c for diagnosis of diabetes for children. .          Passed - eGFR in normal range and within 360 days    GFR, Est African American  Date Value Ref Range Status  12/12/2020 83 > OR = 60 mL/min/1.59m2 Final   GFR, Est Non African American  Date Value Ref Range Status  12/12/2020 72 > OR = 60 mL/min/1.83m2 Final   eGFR  Date Value Ref Range Status  06/20/2023 79 >59 mL/min/1.73 Final         Passed - B12 Level in normal range and within 720 days    Vitamin B-12  Date Value Ref Range Status  02/04/2022 362 200 - 1,100 pg/mL Final    Comment:    . Please Note: Although the reference range for vitamin B12 is [phone] pg/mL, it has been reported that between 5 and 10% of patients with values between 200 and 400 pg/mL  may experience neuropsychiatric and hematologic abnormalities due to occult B12 deficiency; less than 1% of patients with values above 400 pg/mL will have symptoms. .          Passed - CBC within normal limits and completed in the last 12 months    WBC  Date Value Ref Range Status  04/15/2023 6.8 3.8 - 10.8 Thousand/uL Final   RBC  Date Value Ref Range Status  04/15/2023 4.51 3.80 - 5.10 Million/uL Final   Hemoglobin  Date Value Ref Range Status  04/15/2023 11.0 (L) 11.7 - 15.5 g/dL Final  16/08/9603 54.0 11.1 - 15.9 g/dL Final   HCT  Date Value Ref Range Status  04/15/2023 34.9 (L) 35.0 - 45.0 % Final   Hematocrit  Date Value Ref Range Status  08/17/2015 37.3 34.0 - 46.6 % Final   MCHC  Date Value Ref Range Status  04/15/2023 31.5 (L) 32.0 - 36.0 g/dL Final   [facility]  Date Value Ref Range Status  04/15/2023 24.4 (L) 27.0 - 33.0 pg Final   MCV  Date Value Ref Range Status  04/15/2023 77.4 (L) 80.0 - 100.0 fL Final  08/17/2015 82  79 - 97 fL Final   No results found for: "PLTCOUNTKUC", "LABPLAT", "POCPLA" RDW  Date Value Ref Range Status  04/15/2023 15.5 (H) 11.0 - 15.0 % Final  08/17/2015 15.0 12.3 - 15.4 % Final          ferrous sulfate (FEROSUL) 325 (65 FE) MG tablet [Pharmacy Med Name: FERROUS SULFATE 325MG  (5GR) TABS] 90 tablet 0    Sig: TAKE 1 TABLET BY MOUTH DAILY     Endocrinology:  Minerals - Iron Supplementation Failed - 08/15/2023  8:45 AM      Failed - HGB in normal range and within 360 days    Hemoglobin  Date Value Ref Range Status  04/15/2023 11.0 (L) 11.7 - 15.5 g/dL Final  98/09/9146 82.9 11.1 - 15.9 g/dL Final         Failed - HCT in normal range and within 360 days    HCT  Date Value Ref Range Status  04/15/2023 34.9 (L) 35.0 - 45.0 % Final   Hematocrit  Date Value Ref Range Status  08/17/2015 37.3 34.0 - 46.6 % Final         Failed - Ferritin in normal range and within 360 days    Ferritin  Date Value Ref Range Status  04/17/2023 5 (L) 16 - 288 ng/mL Final         Failed - Valid encounter within last 12 months    Recent Outpatient Visits           1 year ago Anemia, unspecified type   [facility] Donita Brooks, MD   1 year ago OAB (overactive bladder)   [facility] Donita Brooks, MD   2 years ago Pneumonia of right lower lobe due to infectious organism   [facility] Pickard, Priscille Heidelberg, MD   2 years ago Right sided sciatica   [facility] Donita Brooks, MD   2 years ago Iron deficiency anemia, unspecified iron deficiency anemia type   [facility] Pickard, Priscille Heidelberg, MD              Passed - RBC in normal range and within 360 days    RBC  Date Value Ref Range Status  04/15/2023 4.51 3.80 - 5.10 Million/uL Final  Passed - Fe (serum) in normal range and within 360 days    Iron  Date Value Ref Range Status  04/17/2023 57 45 - 160 mcg/dL Final   %SAT  Date  Value Ref Range Status  04/17/2023 15 (L) 16 - 45 % (calc) Final

## 2023-09-16 ENCOUNTER — Ambulatory Visit (INDEPENDENT_AMBULATORY_CARE_PROVIDER_SITE_OTHER): Payer: Medicare Other | Admitting: Family Medicine

## 2023-09-16 VITALS — BP 126/72 | HR 68 | Temp 97.6°F | Ht 67.0 in | Wt 164.0 lb

## 2023-09-16 DIAGNOSIS — Z1231 Encounter for screening mammogram for malignant neoplasm of breast: Secondary | ICD-10-CM

## 2023-09-16 DIAGNOSIS — E119 Type 2 diabetes mellitus without complications: Secondary | ICD-10-CM

## 2023-09-16 NOTE — Progress Notes (Signed)
Subjective:    Patient ID: Sherri Leon, female    DOB: 11/07/1936, 87 y.o.   MRN: [id number]  Yesterday, the patient states she noticed a large, "bluish" bump form on the left side of her tongue.  This is pictured above.  It is continuous with the sublingual vein.  It is soft and squishy in texture.  It was sore yesterday.  It is a varicosity continuous with the sublingual vein Past Medical History:  Diagnosis Date   Angina at rest    Arthritis    Colon polyps    Diabetes mellitus without complication (HCC)    Dysphagia    GERD (gastroesophageal reflux disease)    Hyperlipidemia    Hyperlipidemia    Hypertension    Iron deficiency anemia    Past Surgical History:  Procedure Laterality Date   ABDOMINAL HYSTERECTOMY     ABDOMINAL HYSTERECTOMY  1970   APPENDECTOMY  1970   CARDIAC CATHETERIZATION N/A 08/23/2015   Procedure: Left Heart Cath;  Surgeon: Marykay Lex, MD;  Location: [facility] INVASIVE CV LAB;  Service: Cardiovascular;  Laterality: N/A;   COLONOSCOPY WITH PROPOFOL N/A 06/13/2017   Procedure: COLONOSCOPY WITH PROPOFOL;  Surgeon: Scot Jun, MD;  Location: [facility] ENDOSCOPY;  Service: Endoscopy;  Laterality: N/A;   COLONOSCOPY WITH PROPOFOL N/A 10/16/2021   Procedure: COLONOSCOPY WITH PROPOFOL;  Surgeon: Regis Bill, MD;  Location: [facility] ENDOSCOPY;  Service: Endoscopy;  Laterality: N/A;   ESOPHAGOGASTRODUODENOSCOPY (EGD) WITH PROPOFOL N/A 06/13/2017   Procedure: ESOPHAGOGASTRODUODENOSCOPY (EGD) WITH PROPOFOL;  Surgeon: Scot Jun, MD;  Location: [facility] ENDOSCOPY;  Service: Endoscopy;  Laterality: N/A;   ESOPHAGOGASTRODUODENOSCOPY (EGD) WITH PROPOFOL N/A 10/16/2021   Procedure: ESOPHAGOGASTRODUODENOSCOPY (EGD) WITH PROPOFOL;  Surgeon: Regis Bill, MD;  Location: [facility] ENDOSCOPY;  Service: Endoscopy;  Laterality: N/A;   HERNIA REPAIR     TONSILLECTOMY  1955   Current Outpatient Medications on File Prior to Visit  Medication Sig Dispense Refill   Blood  Glucose Monitoring Suppl DEVI 1 each by Does not apply route in the morning, at noon, and at bedtime. May substitute to any manufacturer covered by patient's insurance. 1 each 0   ferrous sulfate (FEROSUL) 325 (65 FE) MG tablet TAKE 1 TABLET BY MOUTH DAILY 90 tablet 0   losartan (COZAAR) 100 MG tablet TAKE 1 TABLET(100 MG) BY MOUTH DAILY 90 tablet 0   metFORMIN (GLUCOPHAGE) 500 MG tablet TAKE 1 TABLET(500 MG) BY MOUTH TWICE DAILY WITH A MEAL 180 tablet 0   Multiple Vitamins-Minerals (MULTIVITAMIN ADULT PO) Take by mouth.     pantoprazole (PROTONIX) 40 MG tablet TAKE 1 TABLET(40 MG) BY MOUTH TWICE DAILY 180 tablet 3   pravastatin (PRAVACHOL) 40 MG tablet TAKE 1 TABLET BY MOUTH EVERY DAY 90 tablet 3   No current facility-administered medications on file prior to visit.   No Known Allergies Social History   Socioeconomic History   Marital status: Married    Spouse name: Not on file   Number of children: 1   Years of education: Not on file   Highest education level: Not on file  Occupational History   Not on file  Tobacco Use   Smoking status: Never   Smokeless tobacco: Never  Vaping Use   Vaping status: Never Used  Substance and Sexual Activity   Alcohol use: No   Drug use: No   Sexual activity: Not Currently  Other Topics Concern   Not on file  Social History Narrative   Lives  alone in 3 story home    Social Determinants of Health   Financial Resource Strain: Low Risk  (11/12/2022)   Overall Financial Resource Strain (CARDIA)    Difficulty of Paying Living Expenses: Not hard at all  Food Insecurity: No Food Insecurity (11/12/2022)   Hunger Vital Sign    Worried About Running Out of Food in the Last Year: Never true    Ran Out of Food in the Last Year: Never true  Transportation Needs: No Transportation Needs (11/12/2022)   PRAPARE - Administrator, Civil Service (Medical): No    Lack of Transportation (Non-Medical): No  Physical Activity: Sufficiently Active  (11/12/2022)   Exercise Vital Sign    Days of Exercise per Week: 5 days    Minutes of Exercise per Session: 30 min  Stress: No Stress Concern Present (11/12/2022)   [employer] of Occupational Health - Occupational Stress Questionnaire    Feeling of Stress : Not at all  Social Connections: Moderately Integrated (11/12/2022)   Social Connection and Isolation Panel [NHANES]    Frequency of Communication with Friends and Family: More than three times a week    Frequency of Social Gatherings with Friends and Family: Three times a week    Attends Religious Services: More than 4 times per year    Active Member of Clubs or Organizations: Yes    Attends Banker Meetings: More than 4 times per year    Marital Status: Widowed  Intimate Partner Violence: Not At Risk (11/12/2022)   Humiliation, Afraid, Rape, and Kick questionnaire    Fear of Current or Ex-Partner: No    Emotionally Abused: No    Physically Abused: No    Sexually Abused: No      Review of Systems  All other systems reviewed and are negative.      Objective:   Physical Exam Vitals reviewed.  Constitutional:      General: She is not in acute distress.    Appearance: She is well-developed. She is not diaphoretic.  HENT:     Mouth/Throat:     Mouth: Mucous membranes are moist. No injury, lacerations, oral lesions or angioedema.     Pharynx: Oropharynx is clear.   Eyes:     Conjunctiva/sclera: Conjunctivae normal.     Pupils: Pupils are equal, round, and reactive to light.  Neck:     Thyroid: No thyromegaly.     Vascular: No JVD.  Cardiovascular:     Rate and Rhythm: Normal rate and regular rhythm.     Heart sounds: Normal heart sounds. No murmur heard.    No friction rub. No gallop.  Pulmonary:     Effort: Pulmonary effort is normal. No respiratory distress.     Breath sounds: Normal breath sounds. No stridor. No wheezing or rales.  Lymphadenopathy:     Cervical: No cervical adenopathy.          Assessment & Plan:  Encounter for screening mammogram for malignant neoplasm of breast - Plan: MM 3D SCREENING MAMMOGRAM BILATERAL BREAST  Controlled type 2 diabetes mellitus without complication, without long-term current use of insulin (HCC) - Plan: Hemoglobin A1c, COMPLETE METABOLIC PANEL WITH GFR I reassured the patient that the object on the left side of her tongue is a varicose vein.  It is not an abscess or a malignancy.  It is soft in texture.  I recommended we simply monitor this.  She may have traumatized it inadvertently in her sleep.  While  the patient is here today we will check her blood sugar to monitor her diabetes.  She would also like to schedule a mammogram to screen for breast cancer

## 2023-09-17 LAB — HEMOGLOBIN A1C
Hgb A1c MFr Bld: 7 %{Hb} — ABNORMAL HIGH (ref <5.7)
Mean Plasma Glucose: 154 mg/dL
eAG (mmol/L): 8.5 mmol/L

## 2023-09-17 LAB — COMPLETE METABOLIC PANEL WITH GFR
AG Ratio: 1.8 (calc) (ref 1.0–2.5)
ALT: 15 U/L (ref 6–29)
AST: 19 U/L (ref 10–35)
Albumin: 4.2 g/dL (ref 3.6–5.1)
Alkaline phosphatase (APISO): 80 U/L (ref 37–153)
BUN: 10 mg/dL (ref 7–25)
CO2: 30 mmol/L (ref 20–32)
Calcium: 10.1 mg/dL (ref 8.6–10.4)
Chloride: 106 mmol/L (ref 98–110)
Creat: 0.6 mg/dL (ref 0.60–0.95)
Globulin: 2.4 g/dL (ref 1.9–3.7)
Glucose, Bld: 95 mg/dL (ref 65–99)
Potassium: 4.1 mmol/L (ref 3.5–5.3)
Sodium: 143 mmol/L (ref 135–146)
Total Bilirubin: 0.5 mg/dL (ref 0.2–1.2)
Total Protein: 6.6 g/dL (ref 6.1–8.1)
eGFR: 88 mL/min/{1.73_m2} (ref >=60)

## 2023-09-23 ENCOUNTER — Telehealth: Payer: Self-pay

## 2023-09-23 NOTE — Telephone Encounter (Signed)
Pt asks if she can come off of her Iron pills? Pt states it concerns her that it makes her stools so dark. Thanks.

## 2023-10-01 ENCOUNTER — Telehealth: Payer: Self-pay

## 2023-10-01 NOTE — Telephone Encounter (Signed)
Copied from CRM [id number]. Topic: Referral - Status >> Oct 01, 2023 11:54 AM Sherri Leon wrote: Reason for CRM: checking on status on referral for a breast mammogram was suppose to be put in 2 weeks ago . Please Call back # [phone] around 3 or 4 today  Patient need to make an appointment need information such as location and phone number to get her breast checked  Duplicate message

## 2023-10-01 NOTE — Telephone Encounter (Signed)
Copied from CRM [id number]. Topic: Referral - Status >> Oct 01, 2023 11:54 AM Dennison Nancy wrote: Reason for CRM: checking on status on referral for a breast mammogram was suppose to be put in 2 weeks ago . Please Call back # [phone] around 3 or 4 today  Patient need to make an appointment need information such as location and phone number to get her breast checked

## 2023-10-02 ENCOUNTER — Other Ambulatory Visit: Payer: Self-pay | Admitting: Family Medicine

## 2023-10-02 DIAGNOSIS — Z1231 Encounter for screening mammogram for malignant neoplasm of breast: Secondary | ICD-10-CM

## 2023-10-20 ENCOUNTER — Other Ambulatory Visit: Payer: Self-pay | Admitting: Family Medicine

## 2023-10-20 DIAGNOSIS — Z1231 Encounter for screening mammogram for malignant neoplasm of breast: Secondary | ICD-10-CM

## 2023-10-20 DIAGNOSIS — N644 Mastodynia: Secondary | ICD-10-CM

## 2023-10-20 DIAGNOSIS — R928 Other abnormal and inconclusive findings on diagnostic imaging of breast: Secondary | ICD-10-CM

## 2023-10-22 ENCOUNTER — Inpatient Hospital Stay
Admission: RE | Admit: 2023-10-22 | Discharge: 2023-10-22 | Disposition: A | Discharge status: Home / Self Care | Payer: Self-pay | Source: Ambulatory Visit | Attending: Family Medicine | Admitting: Family Medicine

## 2023-10-22 ENCOUNTER — Other Ambulatory Visit: Payer: Self-pay | Admitting: *Deleted

## 2023-10-22 DIAGNOSIS — Z1231 Encounter for screening mammogram for malignant neoplasm of breast: Secondary | ICD-10-CM

## 2023-10-26 DIAGNOSIS — J209 Acute bronchitis, unspecified: Secondary | ICD-10-CM | POA: Diagnosis not present

## 2023-11-05 ENCOUNTER — Other Ambulatory Visit: Payer: Medicare Other

## 2023-11-06 ENCOUNTER — Ambulatory Visit
Admission: RE | Admit: 2023-11-06 | Discharge: 2023-11-06 | Disposition: A | Discharge status: Home / Self Care | Payer: Medicare Other | Source: Ambulatory Visit | Attending: Family Medicine | Admitting: Family Medicine

## 2023-11-06 DIAGNOSIS — N644 Mastodynia: Secondary | ICD-10-CM

## 2023-11-06 DIAGNOSIS — Z1231 Encounter for screening mammogram for malignant neoplasm of breast: Secondary | ICD-10-CM | POA: Insufficient documentation

## 2023-11-06 DIAGNOSIS — R92323 Mammographic fibroglandular density, bilateral breasts: Secondary | ICD-10-CM | POA: Diagnosis not present

## 2023-11-07 ENCOUNTER — Other Ambulatory Visit: Payer: Self-pay | Admitting: Family Medicine

## 2023-11-24 ENCOUNTER — Other Ambulatory Visit: Payer: Self-pay | Admitting: Family Medicine

## 2023-12-08 DIAGNOSIS — E113393 Type 2 diabetes mellitus with moderate nonproliferative diabetic retinopathy without macular edema, bilateral: Secondary | ICD-10-CM | POA: Diagnosis not present

## 2023-12-12 ENCOUNTER — Other Ambulatory Visit: Payer: Self-pay | Admitting: Family Medicine

## 2023-12-12 DIAGNOSIS — E119 Type 2 diabetes mellitus without complications: Secondary | ICD-10-CM

## 2024-02-04 ENCOUNTER — Ambulatory Visit: Payer: Self-pay | Admitting: *Deleted

## 2024-02-04 NOTE — Telephone Encounter (Signed)
  Chief Complaint: chest pain-R Symptoms: Patient states she has had chest pain for some time and what started as pain all across the chest is now on the R side. Patient states she has constant discomfort- but can have severe pain at times- she almost went to ED the other night due to her pain level.  Frequency: ongoing 2-3 months- episodic severe pain Pertinent Negatives: Patient denies dizziness, nausea, vomiting, sweating, fever, difficulty breathing, cough Disposition: [x] ED /[] Urgent Care (no appt availability in office) / [] Appointment(In office/virtual)/ []  [facility]/ [] Home Care/ [] Refused Recommended Disposition /[] [facility]/ []  Follow-up with PCP Additional Notes: Patient has been advised per protocol- ED due to chest pain- she wants to see PCP. I am unable to schedule that appointment - will send message to PCP to see if he will see her in the office for this- I am not sure she will go to ED.

## 2024-02-04 NOTE — Telephone Encounter (Unsigned)
 Copied from CRM [id number]. Topic: Clinical - Red Word Triage >> Feb 04, 2024  2:45 PM Franchot Heidelberg wrote: Red Word that prompted transfer to Nurse Triage: Chest pain on right side

## 2024-02-04 NOTE — Telephone Encounter (Signed)
 Reason for Disposition  [1] Chest pain (or "angina") comes and goes AND [2] is happening more often (increasing in frequency) or getting worse (increasing in severity)  (Exception: Chest pains that last only a few seconds.)  Answer Assessment - Initial Assessment Questions 1. LOCATION: "Where does it hurt?"       R side and below- possible knot present 2. RADIATION: "Does the pain go anywhere else?" (e.g., into neck, jaw, arms, back)     Patient states she was hurting all through the chest- but now just on the R 3. ONSET: "When did the chest pain begin?" (Minutes, hours or days)      2-3 months 4. PATTERN: "Does the pain come and go, or has it been constant since it started?"  "Does it get worse with exertion?"      Comes and goes- has pain when going from sitting to standing in tub 5. DURATION: "How long does it last" (e.g., seconds, minutes, hours)     Can tell it is there all the time- worse at times 6. SEVERITY: "How bad is the pain?"  (e.g., Scale 1-10; mild, moderate, or severe)    - MILD (1-3): doesn't interfere with normal activities     - MODERATE (4-7): interferes with normal activities or awakens from sleep    - SEVERE (8-10): excruciating pain, unable to do any normal activities       Can be severe at times 7. CARDIAC RISK FACTORS: "Do you have any history of heart problems or risk factors for heart disease?" (e.g., angina, prior heart attack; diabetes, high blood pressure, high cholesterol, smoker, or strong family history of heart disease)     Hx chest pain 8. PULMONARY RISK FACTORS: "Do you have any history of lung disease?"  (e.g., blood clots in lung, asthma, emphysema, birth control pills)     No- trouble swallowing feels like she has "gob" in throat 9. CAUSE: "What do you think is causing the chest pain?"     Reflux hx- but no pain with eating 10. OTHER SYMPTOMS: "Do you have any other symptoms?" (e.g., dizziness, nausea, vomiting, sweating, fever, difficulty breathing,  cough)       no  Protocols used: Chest Pain-A-AH

## 2024-02-06 ENCOUNTER — Other Ambulatory Visit: Payer: Self-pay | Admitting: Family Medicine

## 2024-02-09 NOTE — Telephone Encounter (Signed)
 Requested medications are due for refill today.  yes  Requested medications are on the active medications list.  yes  Last refill. 11/07/2023 #90 0 rf  Future visit scheduled.   no  Notes to clinic.  Pt last seen in office 04/15/2023 for a follow up visit. Pt is overdue for a CPE.    Requested Prescriptions  Pending Prescriptions Disp Refills   losartan (COZAAR) 100 MG tablet [Pharmacy Med Name: LOSARTAN 100MG  TABLETS] 90 tablet 0    Sig: TAKE 1 TABLET(100 MG) BY MOUTH DAILY     Cardiovascular:  Angiotensin Receptor Blockers Failed - 02/09/2024  7:41 AM      Failed - Valid encounter within last 6 months    Recent Outpatient Visits           2 years ago Anemia, unspecified type   [facility] Donita Brooks, MD   2 years ago OAB (overactive bladder)   [facility] Donita Brooks, MD   2 years ago Pneumonia of right lower lobe due to infectious organism   [facility] Pickard, Priscille Heidelberg, MD   2 years ago Right sided sciatica   [facility] Donita Brooks, MD   2 years ago Iron deficiency anemia, unspecified iron deficiency anemia type   [facility] Pickard, Priscille Heidelberg, MD              Passed - Cr in normal range and within 180 days    Creat  Date Value Ref Range Status  09/16/2023 0.60 0.60 - 0.95 mg/dL Final   Creatinine, Urine  Date Value Ref Range Status  12/10/2022 61 20 - 275 mg/dL Final         Passed - K in normal range and within 180 days    Potassium  Date Value Ref Range Status  09/16/2023 4.1 3.5 - 5.3 mmol/L Final         Passed - Patient is not pregnant      Passed - Last BP in normal range    BP Readings from Last 1 Encounters:  09/16/23 126/72

## 2024-03-02 ENCOUNTER — Encounter: Payer: Self-pay | Admitting: Family Medicine

## 2024-03-02 ENCOUNTER — Ambulatory Visit (INDEPENDENT_AMBULATORY_CARE_PROVIDER_SITE_OTHER): Admitting: Family Medicine

## 2024-03-02 VITALS — BP 120/74 | HR 65 | Temp 97.6°F | Ht 67.0 in | Wt 167.0 lb

## 2024-03-02 DIAGNOSIS — E1159 Type 2 diabetes mellitus with other circulatory complications: Secondary | ICD-10-CM

## 2024-03-02 DIAGNOSIS — Z7984 Long term (current) use of oral hypoglycemic drugs: Secondary | ICD-10-CM

## 2024-03-02 DIAGNOSIS — E119 Type 2 diabetes mellitus without complications: Secondary | ICD-10-CM | POA: Diagnosis not present

## 2024-03-02 MED ORDER — FAMOTIDINE 40 MG PO TABS: 40.0000 mg | ORAL_TABLET | Freq: Every day | ORAL | 3 refills | Status: AC

## 2024-03-02 MED ORDER — SUCRALFATE 1 G PO TABS: 1.0000 g | ORAL_TABLET | Freq: Three times a day (TID) | ORAL | 3 refills | Status: AC

## 2024-03-02 NOTE — Progress Notes (Signed)
 Subjective:    Patient ID: Sherri Leon, female    DOB: [birth year], 88 y.o.   MRN: [id number] Patient is a very sweet 88 year old Caucasian female who is here today for follow-up of her diabetes.  Patient has a history of a hiatal hernia.  She has been having intermittent chest discomfort for months.  Cardiologist did a CT scan of her heart that showed no significant blockages and only nonobstructive coronary artery disease less than a year ago.  The patient denies any shortness of breath.  Instead she states that when she lies down she will feel pain in the center of her chest.  She will also occasionally have acid and food come up into her throat.  She also states that the pain will occur when she bends over.  She is due for follow-up lab work for her diabetes. Past Medical History:  Diagnosis Date   Angina at rest [facility])    Arthritis    Colon polyps    Diabetes mellitus without complication (HCC)    Dysphagia    GERD (gastroesophageal reflux disease)    Hyperlipidemia    Hyperlipidemia    Hypertension    Iron deficiency anemia    Past Surgical History:  Procedure Laterality Date   ABDOMINAL HYSTERECTOMY     ABDOMINAL HYSTERECTOMY  1970   APPENDECTOMY  1970   CARDIAC CATHETERIZATION N/A 08/23/2015   Procedure: Left Heart Cath;  Surgeon: Marykay Lex, MD;  Location: [facility] INVASIVE CV LAB;  Service: Cardiovascular;  Laterality: N/A;   COLONOSCOPY WITH PROPOFOL N/A 06/13/2017   Procedure: COLONOSCOPY WITH PROPOFOL;  Surgeon: Scot Jun, MD;  Location: [facility] ENDOSCOPY;  Service: Endoscopy;  Laterality: N/A;   COLONOSCOPY WITH PROPOFOL N/A 10/16/2021   Procedure: COLONOSCOPY WITH PROPOFOL;  Surgeon: Regis Bill, MD;  Location: [facility] ENDOSCOPY;  Service: Endoscopy;  Laterality: N/A;   ESOPHAGOGASTRODUODENOSCOPY (EGD) WITH PROPOFOL N/A 06/13/2017   Procedure: ESOPHAGOGASTRODUODENOSCOPY (EGD) WITH PROPOFOL;  Surgeon: Scot Jun, MD;  Location: [facility] ENDOSCOPY;  Service:  Endoscopy;  Laterality: N/A;   ESOPHAGOGASTRODUODENOSCOPY (EGD) WITH PROPOFOL N/A 10/16/2021   Procedure: ESOPHAGOGASTRODUODENOSCOPY (EGD) WITH PROPOFOL;  Surgeon: Regis Bill, MD;  Location: [facility] ENDOSCOPY;  Service: Endoscopy;  Laterality: N/A;   HERNIA REPAIR     TONSILLECTOMY  1955   Current Outpatient Medications on File Prior to Visit  Medication Sig Dispense Refill   Blood Glucose Monitoring Suppl DEVI 1 each by Does not apply route in the morning, at noon, and at bedtime. May substitute to any manufacturer covered by patient's insurance. 1 each 0   ferrous sulfate (FEROSUL) 325 (65 FE) MG tablet TAKE 1 TABLET BY MOUTH DAILY 90 tablet 0   glucose blood (ACCU-CHEK GUIDE TEST) test strip USE AS DIRECTED EVERY MORNING, NOON, AND EVERY NIGHT AT BEDTIME 100 strip 3   losartan (COZAAR) 100 MG tablet TAKE 1 TABLET(100 MG) BY MOUTH DAILY 90 tablet 0   metFORMIN (GLUCOPHAGE) 500 MG tablet TAKE 1 TABLET(500 MG) BY MOUTH TWICE DAILY WITH A MEAL 180 tablet 0   Multiple Vitamins-Minerals (MULTIVITAMIN ADULT PO) Take by mouth.     pantoprazole (PROTONIX) 40 MG tablet TAKE 1 TABLET(40 MG) BY MOUTH TWICE DAILY 180 tablet 3   pravastatin (PRAVACHOL) 40 MG tablet TAKE 1 TABLET BY MOUTH EVERY DAY 90 tablet 3   No current facility-administered medications on file prior to visit.   No Known Allergies Social History   Socioeconomic History   Marital status: Married  Spouse name: Not on file   Number of children: 1   Years of education: Not on file   Highest education level: Not on file  Occupational History   Not on file  Tobacco Use   Smoking status: Never   Smokeless tobacco: Never  Vaping Use   Vaping status: Never Used  Substance and Sexual Activity   Alcohol use: No   Drug use: No   Sexual activity: Not Currently  Other Topics Concern   Not on file  Social History Narrative   Lives alone in 3 story home    Social Drivers of Health   Financial Resource Strain: Low Risk   (11/12/2022)   Overall Financial Resource Strain (CARDIA)    Difficulty of Paying Living Expenses: Not hard at all  Food Insecurity: No Food Insecurity (11/12/2022)   Hunger Vital Sign    Worried About Running Out of Food in the Last Year: Never true    Ran Out of Food in the Last Year: Never true  Transportation Needs: No Transportation Needs (11/12/2022)   PRAPARE - Administrator, Civil Service (Medical): No    Lack of Transportation (Non-Medical): No  Physical Activity: Sufficiently Active (11/12/2022)   Exercise Vital Sign    Days of Exercise per Week: 5 days    Minutes of Exercise per Session: 30 min  Stress: No Stress Concern Present (11/12/2022)   [employer] of Occupational Health - Occupational Stress Questionnaire    Feeling of Stress : Not at all  Social Connections: Moderately Integrated (11/12/2022)   Social Connection and Isolation Panel [NHANES]    Frequency of Communication with Friends and Family: More than three times a week    Frequency of Social Gatherings with Friends and Family: Three times a week    Attends Religious Services: More than 4 times per year    Active Member of Clubs or Organizations: Yes    Attends Banker Meetings: More than 4 times per year    Marital Status: Widowed  Intimate Partner Violence: Not At Risk (11/12/2022)   Humiliation, Afraid, Rape, and Kick questionnaire    Fear of Current or Ex-Partner: No    Emotionally Abused: No    Physically Abused: No    Sexually Abused: No      Review of Systems  All other systems reviewed and are negative.      Objective:   Physical Exam Vitals reviewed.  Constitutional:      General: She is not in acute distress.    Appearance: She is well-developed. She is not diaphoretic.  Eyes:     Conjunctiva/sclera: Conjunctivae normal.     Pupils: Pupils are equal, round, and reactive to light.  Neck:     Thyroid: No thyromegaly.     Vascular: No JVD.   Cardiovascular:     Rate and Rhythm: Normal rate and regular rhythm.     Heart sounds: Normal heart sounds. No murmur heard.    No friction rub. No gallop.  Pulmonary:     Effort: Pulmonary effort is normal. No respiratory distress.     Breath sounds: Normal breath sounds. No stridor. No wheezing or rales.  Lymphadenopathy:     Cervical: No cervical adenopathy.         Assessment & Plan:  Controlled type 2 diabetes mellitus without complication, without long-term current use of insulin (HCC) - Plan: CBC with Differential/Platelet, COMPLETE METABOLIC PANEL WITHOUT GFR, Hemoglobin A1c, Lipid panel I suspect that  this chest pain is most likely due to her hiatal hernia given her symptoms.  She is taking Protonix 40 mg twice daily without benefit.  I have recommended that she raise the head of her bed 15 degrees.  Add Pepcid 40 mg daily and even add sucralfate 1 g q. ACH S.  Recheck in 2 weeks to see if chest pain is improving.  Meanwhile check CBC CMP lipid panel and A1c.

## 2024-03-03 LAB — LIPID PANEL
Cholesterol: 151 mg/dL (ref <200)
HDL: 59 mg/dL (ref >=50)
LDL Cholesterol (Calc): 78 mg/dL
Non-HDL Cholesterol (Calc): 92 mg/dL (ref <130)
Total CHOL/HDL Ratio: 2.6 (calc) (ref <5.0)
Triglycerides: 60 mg/dL (ref <150)

## 2024-03-03 LAB — CBC WITH DIFFERENTIAL/PLATELET
Absolute Lymphocytes: 1989 {cells}/uL (ref 850–3900)
Absolute Monocytes: 439 {cells}/uL (ref 200–950)
Basophils Absolute: 20 {cells}/uL (ref 0–200)
Basophils Relative: 0.4 %
Eosinophils Absolute: 82 {cells}/uL (ref 15–500)
Eosinophils Relative: 1.6 %
HCT: 40.6 % (ref 35.0–45.0)
Hemoglobin: 13.4 g/dL (ref 11.7–15.5)
MCH: 28.3 pg (ref 27.0–33.0)
MCHC: 33 g/dL (ref 32.0–36.0)
MCV: 85.7 fL (ref 80.0–100.0)
MPV: 12 fL (ref 7.5–12.5)
Monocytes Relative: 8.6 %
Neutro Abs: 2570 {cells}/uL (ref 1500–7800)
Neutrophils Relative %: 50.4 %
Platelets: 198 10*3/uL (ref 140–400)
RBC: 4.74 10*6/uL (ref 3.80–5.10)
RDW: 14 % (ref 11.0–15.0)
Total Lymphocyte: 39 %
WBC: 5.1 10*3/uL (ref 3.8–10.8)

## 2024-03-03 LAB — COMPLETE METABOLIC PANEL WITHOUT GFR
AG Ratio: 1.9 (calc) (ref 1.0–2.5)
ALT: 10 U/L (ref 6–29)
AST: 18 U/L (ref 10–35)
Albumin: 4.4 g/dL (ref 3.6–5.1)
Alkaline phosphatase (APISO): 88 U/L (ref 37–153)
BUN: 13 mg/dL (ref 7–25)
CO2: 28 mmol/L (ref 20–32)
Calcium: 9.8 mg/dL (ref 8.6–10.4)
Chloride: 106 mmol/L (ref 98–110)
Creat: 0.74 mg/dL (ref 0.60–0.95)
Globulin: 2.3 g/dL (ref 1.9–3.7)
Glucose, Bld: 125 mg/dL — ABNORMAL HIGH (ref 65–99)
Potassium: 4.4 mmol/L (ref 3.5–5.3)
Sodium: 141 mmol/L (ref 135–146)
Total Bilirubin: 0.6 mg/dL (ref 0.2–1.2)
Total Protein: 6.7 g/dL (ref 6.1–8.1)

## 2024-03-03 LAB — HEMOGLOBIN A1C
Hgb A1c MFr Bld: 7.2 % — ABNORMAL HIGH (ref <5.7)
Mean Plasma Glucose: 160 mg/dL
eAG (mmol/L): 8.9 mmol/L

## 2024-03-04 ENCOUNTER — Telehealth: Payer: Self-pay

## 2024-03-04 NOTE — Telephone Encounter (Signed)
 Patient called, left VM to return the call to the office regarding lab results.  Copied from CRM [id number]. Topic: Clinical - Lab/Test Results >> Mar 04, 2024  4:28 PM Hobson Luna F wrote: Reason for CRM: Patient is calling in requesting more information on her lab results. Patient would like to know her exact A1C number.

## 2024-03-09 DIAGNOSIS — M79675 Pain in left toe(s): Secondary | ICD-10-CM | POA: Diagnosis not present

## 2024-03-09 DIAGNOSIS — E119 Type 2 diabetes mellitus without complications: Secondary | ICD-10-CM | POA: Diagnosis not present

## 2024-03-09 DIAGNOSIS — B351 Tinea unguium: Secondary | ICD-10-CM | POA: Diagnosis not present

## 2024-03-09 DIAGNOSIS — M79674 Pain in right toe(s): Secondary | ICD-10-CM | POA: Diagnosis not present

## 2024-03-11 ENCOUNTER — Telehealth: Payer: Self-pay | Admitting: Family Medicine

## 2024-03-11 NOTE — Telephone Encounter (Signed)
 Copied from CRM [id number]. Topic: General - Other >> Mar 11, 2024  8:41 AM Emylou G wrote: Reason for CRM: patient called wanted to relay to Dr Cheril Cork that sucralfate  (CARAFATE ) 1 g tablet and famotidine  (PEPCID ) 40 MG tablet are both working for her.. just some small pain in chest but her throat is better.

## 2024-03-31 ENCOUNTER — Other Ambulatory Visit: Payer: Self-pay | Admitting: Family Medicine

## 2024-04-01 NOTE — Telephone Encounter (Signed)
 Requested Prescriptions  Pending Prescriptions Disp Refills   metFORMIN  (GLUCOPHAGE ) 500 MG tablet [Pharmacy Med Name: METFORMIN  500MG  TABLETS] 180 tablet 0    Sig: TAKE 1 TABLET(500 MG) BY MOUTH TWICE DAILY WITH A MEAL     Endocrinology:  Diabetes - Biguanides Failed - 04/01/2024  2:28 PM      Failed - B12 Level in normal range and within 720 days    Vitamin B-12  Date Value Ref Range Status  02/04/2022 362 200 - 1,100 pg/mL Final    Comment:    . Please Note: Although the reference range for vitamin B12 is [phone] pg/mL, it has been reported that between 5 and 10% of patients with values between 200 and 400 pg/mL may experience neuropsychiatric and hematologic abnormalities due to occult B12 deficiency; less than 1% of patients with values above 400 pg/mL will have symptoms. .          Failed - Valid encounter within last 6 months    Recent Outpatient Visits           1 month ago Controlled type 2 diabetes mellitus without complication, without long-term current use of insulin (HCC)   [place] [facility] Pickard, Cisco Crest, MD   6 months ago Encounter for screening mammogram for malignant neoplasm of breast   [place] [facility] Austine Lefort, MD   11 months ago Controlled type 2 diabetes mellitus with complication, without long-term current use of insulin (HCC)   [place] [facility] Austine Lefort, MD   1 year ago Seasonal allergies   [place] [facility] Jenelle Mis, FNP   1 year ago Controlled type 2 diabetes mellitus with complication, without long-term current use of insulin [facility])   [place] [facility] Pickard, Cisco Crest, MD              Passed - Cr in normal range and within 360 days    Creat  Date Value Ref Range Status  03/02/2024 0.74 0.60 - 0.95 mg/dL Final   Creatinine, Urine  Date Value Ref Range Status  12/10/2022 61 20 - 275 mg/dL Final          Passed - HBA1C is between 0 and 7.9 and within 180 days    Hemoglobin A1C  Date Value Ref Range Status  12/29/2015 6.0  Final   Hgb A1c MFr Bld  Date Value Ref Range Status  03/02/2024 7.2 (H) <5.7 % Final    Comment:    For someone without known diabetes, a hemoglobin A1c value of 6.5% or greater indicates that they may have  diabetes and this should be confirmed with a follow-up  test. . For someone with known diabetes, a value <7% indicates  that their diabetes is well controlled and a value  greater than or equal to 7% indicates suboptimal  control. A1c targets should be individualized based on  duration of diabetes, age, comorbid conditions, and  other considerations. . Currently, no consensus exists regarding use of hemoglobin A1c for diagnosis of diabetes for children. .          Passed - eGFR in normal range and within 360 days    GFR, Est African American  Date Value Ref Range Status  12/12/2020 83 > OR = 60 mL/min/1.87m2 Final   GFR, Est Non African American  Date Value Ref Range Status  12/12/2020 72 > OR = 60 mL/min/1.58m2  Final   eGFR  Date Value Ref Range Status  09/16/2023 88 > OR = 60 mL/min/1.70m2 Final  06/20/2023 79 >59 mL/min/1.73 Final         Passed - CBC within normal limits and completed in the last 12 months    WBC  Date Value Ref Range Status  03/02/2024 5.1 3.8 - 10.8 Thousand/uL Final   RBC  Date Value Ref Range Status  03/02/2024 4.74 3.80 - 5.10 Million/uL Final   Hemoglobin  Date Value Ref Range Status  03/02/2024 13.4 11.7 - 15.5 g/dL Final  40/98/1191 47.8 11.1 - 15.9 g/dL Final   HCT  Date Value Ref Range Status  03/02/2024 40.6 35.0 - 45.0 % Final   Hematocrit  Date Value Ref Range Status  08/17/2015 37.3 34.0 - 46.6 % Final   MCHC  Date Value Ref Range Status  03/02/2024 33.0 32.0 - 36.0 g/dL Final    Comment:    For adults, a slight decrease in the calculated MCHC value (in the range of 30 to 32 g/dL) is  most likely not clinically significant; however, it should be interpreted with caution in correlation with other red cell parameters and the patient's clinical condition.    [facility]  Date Value Ref Range Status  03/02/2024 28.3 27.0 - 33.0 pg Final   MCV  Date Value Ref Range Status  03/02/2024 85.7 80.0 - 100.0 fL Final  08/17/2015 82 79 - 97 fL Final   No results found for: "PLTCOUNTKUC", "LABPLAT", "POCPLA" RDW  Date Value Ref Range Status  03/02/2024 14.0 11.0 - 15.0 % Final  08/17/2015 15.0 12.3 - 15.4 % Final

## 2024-04-06 ENCOUNTER — Ambulatory Visit: Payer: Self-pay | Admitting: *Deleted

## 2024-04-06 NOTE — Telephone Encounter (Signed)
  Chief Complaint: fell and puncture wound to right foot going straight through shoe yesterday hx DM  could not find tetanus shot listed in chart Symptoms: fell outside and had potato digger in hand and 2 stakes from differ went through right shoe . Stakes went through under pad under toes. Toes right foot swollen and red. No bleeding no drainage now. Difficulty walking. Swelling noted right shin area from fall. Patient has been applying neosporin Frequency: yesterday  Pertinent Negatives: Patient denies fever no drainage from right foot.  Disposition: [x] ED /[] Urgent Care (no appt availability in office) / [] Appointment(In office/virtual)/ []  [place] [facility]/ [] Home Care/ [] Refused Recommended Disposition /[] [place] [facility]/ []  Follow-up with PCP Additional Notes:    No available appt until Friday . Instructed patient to go to ED due to hx DM and possible need for tetanus shot. Patient requesting to go to UC due to long wait times in ED. Recommended ED unsure of where patient will go, please advise.   CAL notified patient really does not want to go to ED.   Copied from CRM [id number]. Topic: Clinical - Red Word Triage >> Apr 06, 2024  2:44 PM Carlatta H wrote: [facility] that prompted transfer to Nurse Triage: Patient fell and a potato cutter went in her right foot//redness,pain and swelling// Reason for Disposition  [1] Puncture wound of foot AND [2] puncture went through shoe (e.g., tennis shoe)  Answer Assessment - Initial Assessment Questions 1. LOCATION: "Where is the puncture located?"      Right foot under toes  2. OBJECT: "What was the object that punctured the skin?"      Potato digger - "sharp stakes" x 2 3. DEPTH: "How deep do you think the puncture goes?"      Straight through foot  4. ONSET: "When did the injury occur?" (Minutes or hours)     Yesterday  5. PAIN: "Is it painful?" If Yes, ask: "How bad is the pain?"  (Scale 1-10; or mild, moderate, severe)     Yes   6. TETANUS: "When was the last tetanus booster?"     Not sure  7. PREGNANCY: "Is there any chance you are pregnant?" "When was your last menstrual period?"     na  Protocols used: Puncture Wound-A-AH

## 2024-04-07 ENCOUNTER — Encounter: Payer: Self-pay | Admitting: Family Medicine

## 2024-04-07 ENCOUNTER — Ambulatory Visit (INDEPENDENT_AMBULATORY_CARE_PROVIDER_SITE_OTHER): Admitting: Family Medicine

## 2024-04-07 VITALS — BP 122/80 | HR 62 | Temp 98.0°F | Ht 67.0 in | Wt 167.0 lb

## 2024-04-07 DIAGNOSIS — M25571 Pain in right ankle and joints of right foot: Secondary | ICD-10-CM

## 2024-04-07 DIAGNOSIS — E1165 Type 2 diabetes mellitus with hyperglycemia: Secondary | ICD-10-CM | POA: Diagnosis not present

## 2024-04-07 DIAGNOSIS — Z23 Encounter for immunization: Secondary | ICD-10-CM

## 2024-04-07 DIAGNOSIS — S91331A Puncture wound without foreign body, right foot, initial encounter: Secondary | ICD-10-CM | POA: Diagnosis not present

## 2024-04-07 MED ORDER — CEPHALEXIN 500 MG PO CAPS: 500.0000 mg | ORAL_CAPSULE | Freq: Four times a day (QID) | ORAL | 0 refills | Status: AC

## 2024-04-07 NOTE — Progress Notes (Signed)
 Patient Office Visit  Assessment & Plan:  Puncture wound of right foot, initial encounter -     Tdap vaccine greater than or equal to 86yo IM -     Cephalexin; Take 1 capsule (500 mg total) by mouth 4 (four) times daily.  Dispense: 40 capsule; Refill: 0  Type 2 diabetes mellitus with hyperglycemia, without long-term current use of insulin (HCC)  Toe joint pain, right   Assessment and Plan    Foot injury with possible fracture Foot injury with puncture wounds, redness, swelling, and possible second toe fracture. Declined x-ray as no intervention would be pursued. - Advise rest and elevation of the foot. - Monitor for signs of infection or worsening symptoms. - Consider x-ray if symptoms worsen or do not improve.  Need for tetanus vaccination Recent foot injury with potential exposure to soil and bacteria. Informed consent obtained for tetanus vaccination. - Administer tetanus vaccination.  Diabetes mellitus Diabetes mellitus with HbA1c at 7.2% and evening blood sugar at 160 mg/dL. No history of cellulitis or skin infections. Concern for potential complications due to foot injury.          Return if symptoms worsen or fail to improve.   Subjective:     Patient ID: Sherri Leon, female    DOB: Nov 23, 1935  Age: 88 y.o. MRN: [id number]  Chief Complaint  Patient presents with   Puncture Wound    Puncture wood to right foot. Happened while gardening 2 days ago.     HPI Discussed the use of AI scribe software for clinical note transcription with the patient, who gave verbal consent to proceed.  History of Present Illness        Sherri Leon is an 88 year old female with diabetes who presents with a foot injury and possible infection.  She sustained a foot injury on Monday while working in her garden. The incident involved a 'tater digger' that jumped into her shoe, causing two holes in her tennis shoes. She is unsure if the object penetrated her entire foot but notes  redness and swelling in the area. Her second toe feels broken and is particularly red and sore, although she is able to walk on it. Initially, the redness extended halfway up her foot. Since Monday swelling has reduced and foot looks much better.   She has a history of diabetes, with her last hemoglobin A1c recorded at 7.2. She monitors her blood sugar, which has been around 160 in the evenings. No previous history of cellulitis or skin infections is reported.  During the incident, she also bruised her leg, likely from falling and hitting it against the equipment. Despite the injury, she remains active, cooking and caring for her neighbors and mowing her lawn.  She lives alone, [place] and maintains an active lifestyle. Physical Exam EXTREMITIES: Redness and swelling on foot and bottom of foot there's puncture site MUSCULOSKELETAL: Reduced redness on foot compared to previous. Tenderness on second toe. Results LABS HbA1c: 7.2% Blood Glucose: 160 mg/dL Assessment & Plan Foot injury with possible 2nd toe fracture Foot injury with puncture wounds, redness, swelling, and possible second toe fracture. Declined x-ray as no intervention would be pursued. - Advise rest and elevation of the foot. - Monitor for signs of infection or worsening symptoms. Keflex sent to pharmacy.  - Consider x-ray if symptoms worsen or do not improve.  Need for tetanus vaccination Recent foot injury with potential exposure to soil and bacteria. Informed  consent obtained for tetanus vaccination. - Administer tetanus vaccination.  Diabetes mellitus well controlled Diabetes mellitus with HbA1c at 7.2% and evening blood sugar at 100-160 mg/dL. No history of cellulitis or skin infections. Concern for potential complications due to foot injury.     The ASCVD Risk score (Arnett DK, et al., 2019) failed to calculate for the following reasons:   The 2019 ASCVD risk score is only valid  for ages 76 to 75  Past Medical History:  Diagnosis Date   Angina at rest [facility])    Arthritis    Colon polyps    Diabetes mellitus without complication (HCC)    Dysphagia    GERD (gastroesophageal reflux disease)    Hyperlipidemia    Hyperlipidemia    Hypertension    Iron  deficiency anemia    Past Surgical History:  Procedure Laterality Date   ABDOMINAL HYSTERECTOMY     ABDOMINAL HYSTERECTOMY  1970   APPENDECTOMY  1970   CARDIAC CATHETERIZATION N/A 08/23/2015   Procedure: Left Heart Cath;  Surgeon: Arleen Lacer, MD;  Location: [facility] INVASIVE CV LAB;  Service: Cardiovascular;  Laterality: N/A;   COLONOSCOPY WITH PROPOFOL  N/A 06/13/2017   Procedure: COLONOSCOPY WITH PROPOFOL ;  Surgeon: Cassie Click, MD;  Location: [facility] ENDOSCOPY;  Service: Endoscopy;  Laterality: N/A;   COLONOSCOPY WITH PROPOFOL  N/A 10/16/2021   Procedure: COLONOSCOPY WITH PROPOFOL ;  Surgeon: Shane Darling, MD;  Location: [facility] ENDOSCOPY;  Service: Endoscopy;  Laterality: N/A;   ESOPHAGOGASTRODUODENOSCOPY (EGD) WITH PROPOFOL  N/A 06/13/2017   Procedure: ESOPHAGOGASTRODUODENOSCOPY (EGD) WITH PROPOFOL ;  Surgeon: Cassie Click, MD;  Location: [facility] ENDOSCOPY;  Service: Endoscopy;  Laterality: N/A;   ESOPHAGOGASTRODUODENOSCOPY (EGD) WITH PROPOFOL  N/A 10/16/2021   Procedure: ESOPHAGOGASTRODUODENOSCOPY (EGD) WITH PROPOFOL ;  Surgeon: Shane Darling, MD;  Location: [facility] ENDOSCOPY;  Service: Endoscopy;  Laterality: N/A;   HERNIA REPAIR     TONSILLECTOMY  1955   Social History   Tobacco Use   Smoking status: Never   Smokeless tobacco: Never  Vaping Use   Vaping status: Never Used  Substance Use Topics   Alcohol use: No   Drug use: No   Family History  Problem Relation Age of Onset   Hypertension Mother    Heart attack Son    No Known Allergies  ROS    Objective:    BP 122/80   Pulse 62   Temp 98 F (36.7 C)   Ht 5\' 7"  (1.702 m)   Wt 167 lb (75.8 kg)   SpO2 99%   BMI 26.16 kg/m  BP  Readings from Last 3 Encounters:  04/07/24 122/80  03/02/24 120/74  09/16/23 126/72   Wt Readings from Last 3 Encounters:  04/07/24 167 lb (75.8 kg)  03/02/24 167 lb (75.8 kg)  09/16/23 164 lb (74.4 kg)    Physical Exam Vitals and nursing note reviewed.  Constitutional:      Appearance: Normal appearance.  HENT:     Head: Normocephalic.     Right Ear: Tympanic membrane, ear canal and external ear normal.     Left Ear: Tympanic membrane, ear canal and external ear normal.  Eyes:     Extraocular Movements: Extraocular movements intact.     Pupils: Pupils are equal, round, and reactive to light.  Cardiovascular:     Rate and Rhythm: Normal rate and regular rhythm.     Heart sounds: Normal heart sounds.  Pulmonary:     Effort: Pulmonary effort is normal.     Breath sounds:  Normal breath sounds. No wheezing.  Musculoskeletal:     Right lower leg: Edema present.     Left lower leg: No edema.     Right ankle: Swelling present.     Right foot: Swelling present.  Skin:    Findings: Erythema present.     Comments: Right foot swollen, red and tender bottom foot (puncture site). 2nd toe tender to palpation (pt bearing weight/walking without difficulty)  Neurological:     General: No focal deficit present.     Mental Status: She is alert and oriented to person, place, and time.      No results found for any visits on 04/07/24.

## 2024-05-05 ENCOUNTER — Other Ambulatory Visit: Payer: Self-pay | Admitting: Family Medicine

## 2024-05-26 DIAGNOSIS — J302 Other seasonal allergic rhinitis: Secondary | ICD-10-CM | POA: Diagnosis not present

## 2024-05-26 DIAGNOSIS — E785 Hyperlipidemia, unspecified: Secondary | ICD-10-CM | POA: Diagnosis not present

## 2024-05-26 DIAGNOSIS — K219 Gastro-esophageal reflux disease without esophagitis: Secondary | ICD-10-CM | POA: Diagnosis not present

## 2024-05-26 DIAGNOSIS — Z7689 Persons encountering health services in other specified circumstances: Secondary | ICD-10-CM | POA: Diagnosis not present

## 2024-05-26 DIAGNOSIS — D509 Iron deficiency anemia, unspecified: Secondary | ICD-10-CM | POA: Diagnosis not present

## 2024-05-26 DIAGNOSIS — R1319 Other dysphagia: Secondary | ICD-10-CM | POA: Diagnosis not present

## 2024-05-26 DIAGNOSIS — I10 Essential (primary) hypertension: Secondary | ICD-10-CM | POA: Diagnosis not present

## 2024-05-26 DIAGNOSIS — E1169 Type 2 diabetes mellitus with other specified complication: Secondary | ICD-10-CM | POA: Diagnosis not present

## 2024-05-26 DIAGNOSIS — E1159 Type 2 diabetes mellitus with other circulatory complications: Secondary | ICD-10-CM | POA: Diagnosis not present

## 2024-05-26 DIAGNOSIS — K5909 Other constipation: Secondary | ICD-10-CM | POA: Diagnosis not present

## 2024-06-08 DIAGNOSIS — E113393 Type 2 diabetes mellitus with moderate nonproliferative diabetic retinopathy without macular edema, bilateral: Secondary | ICD-10-CM | POA: Diagnosis not present

## 2024-06-24 DIAGNOSIS — Z1389 Encounter for screening for other disorder: Secondary | ICD-10-CM | POA: Diagnosis not present

## 2024-06-24 DIAGNOSIS — Z1383 Encounter for screening for respiratory disorder NEC: Secondary | ICD-10-CM | POA: Diagnosis not present

## 2024-06-24 DIAGNOSIS — Z0000 Encounter for general adult medical examination without abnormal findings: Secondary | ICD-10-CM | POA: Diagnosis not present

## 2024-07-09 DIAGNOSIS — Z789 Other specified health status: Secondary | ICD-10-CM | POA: Diagnosis not present

## 2024-07-09 DIAGNOSIS — E1159 Type 2 diabetes mellitus with other circulatory complications: Secondary | ICD-10-CM | POA: Diagnosis not present

## 2024-07-09 DIAGNOSIS — I10 Essential (primary) hypertension: Secondary | ICD-10-CM | POA: Diagnosis not present

## 2024-07-09 DIAGNOSIS — R001 Bradycardia, unspecified: Secondary | ICD-10-CM | POA: Diagnosis not present

## 2024-08-04 ENCOUNTER — Other Ambulatory Visit: Payer: Self-pay | Admitting: Family Medicine

## 2024-09-16 ENCOUNTER — Encounter: Payer: Self-pay | Admitting: Cardiology

## 2024-09-16 ENCOUNTER — Ambulatory Visit: Attending: Cardiology | Admitting: Cardiology

## 2024-09-16 VITALS — BP 150/80 | HR 62 | Ht 65.0 in | Wt 169.4 lb

## 2024-09-16 DIAGNOSIS — I10 Essential (primary) hypertension: Secondary | ICD-10-CM | POA: Diagnosis not present

## 2024-09-16 DIAGNOSIS — I2510 Atherosclerotic heart disease of native coronary artery without angina pectoris: Secondary | ICD-10-CM

## 2024-09-16 DIAGNOSIS — E785 Hyperlipidemia, unspecified: Secondary | ICD-10-CM

## 2024-09-16 NOTE — Progress Notes (Signed)
 Cardiology Office Note:    Date:  09/16/2024   ID:  Sherri Leon, DOB Sep 09, 1936, MRN [id number]  PCP:  Duanne Butler DASEN, MD   [facility] Providers Cardiologist:  Redell Cave, MD     Referring MD: Duanne Butler DASEN, MD   Chief Complaint  Patient presents with   86 MONTH FOLLOW-UP    Pt chest pain, BP been high, coughing.    History of Present Illness:    Sherri Leon is a 88 y.o. female with a hx of nonobstructive CAD (mild LAD, RCA stenosis on CCTA 9/24 ), hypertension, hyperlipidemia who presents for follow-up.    Doing okay, some occasional chest pain with pushing on chest.  Also complains of a dry cough with difficulty clearing her throat.  Plans to follow-up with PCP and ENT.  BP at home adequately controlled, compliant with current meds as prescribed.   Prior notes/studies Left heart cath 2016 no CAD Echo 2016 normal EF.  Past Medical History:  Diagnosis Date   Angina at rest    Arthritis    Colon polyps    Diabetes mellitus without complication (HCC)    Dysphagia    GERD (gastroesophageal reflux disease)    Hyperlipidemia    Hyperlipidemia    Hypertension    Iron  deficiency anemia     Past Surgical History:  Procedure Laterality Date   ABDOMINAL HYSTERECTOMY     ABDOMINAL HYSTERECTOMY  1970   APPENDECTOMY  1970   CARDIAC CATHETERIZATION N/A 08/23/2015   Procedure: Left Heart Cath;  Surgeon: Alm LELON Clay, MD;  Location: [facility] INVASIVE CV LAB;  Service: Cardiovascular;  Laterality: N/A;   COLONOSCOPY WITH PROPOFOL  N/A 06/13/2017   Procedure: COLONOSCOPY WITH PROPOFOL ;  Surgeon: Viktoria Lamar DASEN, MD;  Location: [facility] ENDOSCOPY;  Service: Endoscopy;  Laterality: N/A;   COLONOSCOPY WITH PROPOFOL  N/A 10/16/2021   Procedure: COLONOSCOPY WITH PROPOFOL ;  Surgeon: Maryruth Ole DASEN, MD;  Location: [facility] ENDOSCOPY;  Service: Endoscopy;  Laterality: N/A;   ESOPHAGOGASTRODUODENOSCOPY (EGD) WITH PROPOFOL  N/A 06/13/2017   Procedure:  ESOPHAGOGASTRODUODENOSCOPY (EGD) WITH PROPOFOL ;  Surgeon: Viktoria Lamar DASEN, MD;  Location: [facility] ENDOSCOPY;  Service: Endoscopy;  Laterality: N/A;   ESOPHAGOGASTRODUODENOSCOPY (EGD) WITH PROPOFOL  N/A 10/16/2021   Procedure: ESOPHAGOGASTRODUODENOSCOPY (EGD) WITH PROPOFOL ;  Surgeon: Maryruth Ole DASEN, MD;  Location: [facility] ENDOSCOPY;  Service: Endoscopy;  Laterality: N/A;   HERNIA REPAIR     TONSILLECTOMY  1955    Current Medications: Current Meds  Medication Sig   Blood Glucose Monitoring Suppl DEVI 1 each by Does not apply route in the morning, at noon, and at bedtime. May substitute to any manufacturer covered by patient's insurance.   cephALEXin  (KEFLEX ) 500 MG capsule Take 1 capsule (500 mg total) by mouth 4 (four) times daily.   famotidine  (PEPCID ) 40 MG tablet Take 1 tablet (40 mg total) by mouth daily.   ferrous sulfate  (FEROSUL) 325 (65 FE) MG tablet TAKE 1 TABLET BY MOUTH DAILY   glucose blood (ACCU-CHEK GUIDE TEST) test strip USE AS DIRECTED EVERY MORNING, NOON, AND EVERY NIGHT AT BEDTIME   losartan  (COZAAR ) 100 MG tablet TAKE 1 TABLET(100 MG) BY MOUTH DAILY   metFORMIN  (GLUCOPHAGE ) 500 MG tablet TAKE 1 TABLET(500 MG) BY MOUTH TWICE DAILY WITH A MEAL   Multiple Vitamins-Minerals (MULTIVITAMIN ADULT PO) Take by mouth.   pantoprazole  (PROTONIX ) 40 MG tablet TAKE 1 TABLET(40 MG) BY MOUTH TWICE DAILY   pravastatin  (PRAVACHOL ) 40 MG tablet TAKE 1 TABLET BY MOUTH EVERY DAY  sucralfate  (CARAFATE ) 1 g tablet Take 1 tablet (1 g total) by mouth 4 (four) times daily -  with meals and at bedtime.     Allergies:   Patient has no known allergies.   Social History   Socioeconomic History   Marital status: Married    Spouse name: Not on file   Number of children: 1   Years of education: Not on file   Highest education level: Not on file  Occupational History   Not on file  Tobacco Use   Smoking status: Never   Smokeless tobacco: Never  Vaping Use   Vaping status: Never Used   Substance and Sexual Activity   Alcohol use: No   Drug use: No   Sexual activity: Not Currently  Other Topics Concern   Not on file  Social History Narrative   Lives alone in 3 story home.   1 son deceased 2023-11-16 at age 23.   1 son living.   Social Drivers of Health   Financial Resource Strain: Patient Declined (03/09/2024)   Received from [facility]   Overall Financial Resource Strain (CARDIA)    Difficulty of Paying Living Expenses: Patient declined  Food Insecurity: Patient Declined (03/09/2024)   Received from [facility]   Hunger Vital Sign    Within the past 12 months, you worried that your food would run out before you got the money to buy more.: Patient declined    Within the past 12 months, the food you bought just didn't last and you didn't have money to get more.: Patient declined  Transportation Needs: Patient Declined (03/09/2024)   Received from [facility] - Transportation    In the past 12 months, has lack of transportation kept you from medical appointments or from getting medications?: Patient declined    Lack of Transportation (Non-Medical): Patient declined  Physical Activity: Sufficiently Active (11/12/2022)   Exercise Vital Sign    Days of Exercise per Week: 5 days    Minutes of Exercise per Session: 30 min  Stress: No Stress Concern Present (11/12/2022)   [employer]ational [employer] - Occupational Stress Questionnaire    Feeling of Stress : Not at all  Social Connections: Moderately Integrated (11/12/2022)   Social Connection and Isolation Panel    Frequency of Communication with Friends and Family: More than three times a week    Frequency of Social Gatherings with Friends and Family: Three times a week    Attends Religious Services: More than 4 times per year    Active Member of Clubs or Organizations: Yes    Attends Banker Meetings: More than 4 times per  year    Marital Status: Widowed     Family History: The patient's family history includes Heart attack in her son; Hypertension in her mother.  ROS:   Please see the history of present illness.     All other systems reviewed and are negative.  EKGs/Labs/Other Studies Reviewed:    The following studies were reviewed today:  EKG Interpretation Date/Time:  Thursday September 16 2024 11:39:23 EDT Ventricular Rate:  62 PR Interval:    QRS Duration:  84 QT Interval:  408 QTC Calculation: 414 R Axis:   11  Text Interpretation: Junctional rhythm Nonspecific ST abnormality Confirmed by Darliss Rogue ([id number]) on 09/16/2024 11:43:50 AM    Recent Labs: 03/02/2024: ALT 10; BUN 13; Creat 0.74; Hemoglobin 13.4; Platelets 198; Potassium 4.4; Sodium 141  Recent Lipid Panel    Component Value Date/Time   CHOL 151 03/02/2024 0916   TRIG 60 03/02/2024 0916   HDL 59 03/02/2024 0916   CHOLHDL 2.6 03/02/2024 0916   VLDL 34 (H) 04/18/2017 1222   LDLCALC 78 03/02/2024 0916     Risk Assessment/Calculations:     Physical Exam:    VS:  BP (!) 150/80 (BP Location: Left Arm, Patient Position: Sitting, Cuff Size: Normal)   Pulse 62 Comment: 70 oximeter  Ht 5' 5 (1.651 m)   Wt 169 lb 6.4 oz (76.8 kg)   SpO2 99%   BMI 28.19 kg/m     Wt Readings from Last 3 Encounters:  09/16/24 169 lb 6.4 oz (76.8 kg)  04/07/24 167 lb (75.8 kg)  03/02/24 167 lb (75.8 kg)     GEN:  Well nourished, well developed in no acute distress HEENT: Normal NECK: No JVD; No carotid bruits CARDIAC: RRR, no murmurs, rubs, gallops RESPIRATORY:  Clear to auscultation without rales, wheezing or rhonchi  ABDOMEN: Soft, non-tender, non-distended MUSCULOSKELETAL:  No edema; chest wall tenderness on palpation SKIN: Warm and dry NEUROLOGIC:  Alert and oriented x 3 PSYCHIATRIC:  Normal affect   ASSESSMENT:    1. Coronary artery disease involving native coronary artery of native heart, unspecified whether angina  present   2. Primary hypertension   3. Hyperlipidemia, unspecified hyperlipidemia type    PLAN:    In order of problems listed above:  Mild nonobstructive CAD on CCTA 9/24 in RCA and LAD.  Echo 9/24 EF 55 to 60%.  Continue pravastatin  40 mg daily.  Chest pain not consistent with angina, reproducible with palpation, suggesting musculoskeletal etiology. Hypertension, BP elevated today, usually controlled.  Continue losartan  100 mg dai. Hyperlipidemia, cholesterol controlled.  Continue Pravachol  40 mg daily.  Follow-up in 1 year or earlier as needed.     Medication Adjustments/Labs and Tests Ordered: Current medicines are reviewed at length with the patient today.  Concerns regarding medicines are outlined above.  Orders Placed This Encounter  Procedures   EKG 12-Lead   No orders of the defined types were placed in this encounter.   Patient Instructions  Medication Instructions:  Your physician recommends that you continue on your current medications as directed. Please refer to the Current Medication list given to you today.   *If you need a refill on your cardiac medications before your next appointment, please call your pharmacy*  Lab Work: No labs ordered today  If you have labs (blood work) drawn today and your tests are completely normal, you will receive your results only by: MyChart Message (if you have MyChart) OR A paper copy in the mail If you have any lab test that is abnormal or we need to change your treatment, we will call you to review the results.  Testing/Procedures: No test ordered today   Follow-Up: At [facility], you and your health needs are our priority.  As part of our continuing mission to provide you with exceptional heart care, our providers are all part of one team.  This team includes your primary Cardiologist (physician) and Advanced Practice Providers or APPs (Physician Assistants and Nurse Practitioners) who all work together to provide  you with the care you need, when you need it.  Your next appointment:   1 year(s)  Provider:   You may see Redell Cave, MD or one of the following Advanced Practice Providers on your designated Care Team:   Lonni Meager, NP Lesley  Lorene, PA-C Bernardino Bring, PA-C Cadence Rathbun, PA-C Tylene Lunch, NP Barnie Hila, NP    We recommend signing up for the patient portal called MyChart.  Sign up information is provided on this After Visit Summary.  MyChart is used to connect with patients for Virtual Visits (Telemedicine).  Patients are able to view lab/test results, encounter notes, upcoming appointments, etc.  Non-urgent messages can be sent to your provider as well.   To learn more about what you can do with MyChart, go to [email or web address].          Signed, Redell Cave, MD  09/16/2024 12:18 PM    [facility]

## 2024-09-16 NOTE — Patient Instructions (Signed)

## 2024-09-19 NOTE — ED Provider Notes (Signed)
 [facility] Emergency Department Provider Note   ED Clinical Impression   Final diagnoses:  Dizziness (Primary)  Hypertension, unspecified type    Initial Impression, ED Course, Assessment and Plan   Impression: 88 y.o. female with a past medical history of DM, ACS, GERD, HLD, HTN who presents for evaluation of an episode of dizziness and hypertension, now resolving and never experiencing any syncope or neurologic findings. She is hds. No provoked nystagmus and no evidence of posterior circulation stroke. Differential includes symptomatic hypertension, labs from triage without hypertensive emergency, peripheral vertigo, arrhythmia. Suspicion of blood pressure related pathology, given persistent hypertension during the symptoms.  She currently has prescriptions for losartan  and amlodipine . Not sure if she is doing both. Will confirm with family and have her follow up with PCP. Will plan to see if she is symptomatic with orthostatic movements. Anticipate dc.   ED Course as of 09/19/24 0509  Sun Sep 19, 2024  0124 Ecg with sinus bradycardia   0230 Patient with no orthostatic changes nor provoking of worsening symptoms.  Discussed with her that continued blood pressure control will be important as well as follow-up with her PCP.  She is going to monitor when dizziness reoccurs, would be careful with changing positions and restart amlodipine  at 5 mg with continued blood pressure checks over the next Avril days.  Family will stay nearby.  Advise close follow-up with PCP and for someone to stay with her in the coming days to ensure she is fully recovered.  He is otherwise stable for discharge.  Strict return precautions discussed.    Additional Medical Decision Making    Outside Historian(s) Family   External Records Reviewed Outpatient notes  Independent Interpretation of Studies I have independently interpreted the following studies: EKG(s)  Discussion of  Management with other Providers or Support Staff I discussed the management of this patient with: N/A   Considerations Regarding Disposition/Escalation of Care and Critical Care The following items were considered in the care and disposition of this patient: Appropriateness for discharge  Labs and radiology results that were available during my care of the patient were independently reviewed by me and considered in my medical decision making.  Portions of this record have been created using Scientist, clinical (histocompatibility and immunogenetics). Dictation errors have been sought, but may not have been identified and corrected. ____________________________________________  I have reviewed the triage vital signs and the nursing notes.   History   Chief Complaint Dizziness   HPI  Sherri Leon is a 88 y.o. female with a past medical history of DM, ACS, GERD, HLD, HTN who presents for evaluation of an episode of dizziness and hypertension.  Patient was in her usual state of health, eating dinner this evening (fried fish, baked potato) when she developed sudden onset lightheadedness/dizziness.  Feeling near syncopal, not room spinning.  Unable to walk on her own.  No lean or unsteadiness.  Was able to drive home but did not feel well.  Got home and checked her blood pressure and noted that her systolics were in the 200s.  Family had fire, and evaluate her as well confirmed a manual blood pressure of the 200 systolic.  No chest pain shortness of breath palpitations.  No lower extremity swelling.  No abdominal pain no diarrhea.  No unilateral weakness numbness or tingling.  She has had 3 separate episodes like this before without clear cause.  No residual symptoms from either.  Denies any hearing changes pressure in her ears recently  evaluated by her primary care physician, a as needed blood pressure medication was added.  She did not take that today and only took her losartan  which she has been on for some time.  MD to  review PCP note from 11/1 however details unavailable.  Blood pressure medications and dispense are amlodipine  and losartan    Past Medical History[1]  Problem List[2]  Past Surgical History[3]  No current facility-administered medications for this encounter. No current outpatient medications on file.  Allergies Patient has no known allergies.  Family History[4]  Social History Short Social History[5]   Physical Exam   ED Triage Vitals [09/18/24 2152]  Enc Vitals Group     BP 178/91     Pulse 60     SpO2 Pulse 60     Resp 16     Temp 36.9 C (98.5 F)     Temp Source Oral     SpO2 100 %     Weight 74.8 kg (165 lb)     Height 1.651 m (5' 5)    Constitutional: Alert and oriented.  In no acute distress Eyes: Conjunctivae are normal. ENT      Head: Normocephalic and atraumatic.      Nose: No congestion.      Mouth/Throat: Mucous membranes are moist.      Neck: No stridor.  No bruits Hematological/Lymphatic/Immunilogical: No cervical lymphadenopathy. Cardiovascular: Normal rate, regular rhythm. Normal and symmetric distal pulses are present in all extremities.  No murmurs Respiratory: Normal respiratory effort. Breath sounds are normal. Gastrointestinal: Soft and nontender. There is no CVA tenderness. Musculoskeletal: Normal range of motion in all extremities.  No lower extremity edema Neurologic: Alert and oriented x 4. Face symmetric, tongue midline. Facial sensation intact. Strength 5/5 in bilateral upper and lower extremities. Sensation to light touch grossly intact throughout. Toes down going.No pronator drift. Normal finger to nose. Skin: Skin is warm, dry and intact. No rash noted. Psychiatric: Mood and affect are normal. Speech and behavior are normal.   EKG   Sinus bradycardia. Non ischemic ecg.   Radiology   XR Chest 2 views  Final Result    No acute cardiopulmonary abnormalities.               Procedures   Procedure(s) performed:  None.          [1] No past medical history on file. [2] There is no problem list on file for this patient. [3] Past Surgical History: Procedure Laterality Date  . HYSTERECTOMY  1970  [4] Family History Problem Relation Age of Onset  . Breast cancer Neg Hx   [5]    Murray, Collyn Tamsen, MD 09/19/24 [phone]

## 2024-10-05 ENCOUNTER — Other Ambulatory Visit: Payer: Self-pay | Admitting: Family Medicine

## 2024-10-05 ENCOUNTER — Encounter: Payer: Self-pay | Admitting: Internal Medicine

## 2024-10-05 DIAGNOSIS — Z1231 Encounter for screening mammogram for malignant neoplasm of breast: Secondary | ICD-10-CM

## 2024-11-23 ENCOUNTER — Ambulatory Visit
Admission: RE | Admit: 2024-11-23 | Discharge: 2024-11-23 | Disposition: A | Discharge status: Home / Self Care | Source: Ambulatory Visit | Attending: Family Medicine | Admitting: Family Medicine

## 2024-11-23 DIAGNOSIS — Z1231 Encounter for screening mammogram for malignant neoplasm of breast: Secondary | ICD-10-CM | POA: Diagnosis present

## 2024-11-25 ENCOUNTER — Encounter: Payer: Self-pay | Admitting: Family Medicine

## 2024-12-02 ENCOUNTER — Other Ambulatory Visit: Payer: Self-pay

## 2024-12-02 DIAGNOSIS — R1032 Left lower quadrant pain: Secondary | ICD-10-CM

## 2024-12-03 ENCOUNTER — Ambulatory Visit
Admission: RE | Admit: 2024-12-03 | Discharge: 2024-12-03 | Disposition: A | Discharge status: Home / Self Care | Source: Ambulatory Visit

## 2024-12-03 DIAGNOSIS — R1032 Left lower quadrant pain: Secondary | ICD-10-CM | POA: Diagnosis present
